# Patient Record
Sex: Male | Born: 1940 | Race: White | Hispanic: No | Smoking: Never smoker
Health system: Southern US, Community
[De-identification: ages and names within clinical notes are randomized; demographics above are authoritative.]

## PROBLEM LIST (undated history)

## (undated) DIAGNOSIS — C800 Disseminated malignant neoplasm, unspecified: Secondary | ICD-10-CM

## (undated) DIAGNOSIS — J189 Pneumonia, unspecified organism: Secondary | ICD-10-CM

## (undated) DIAGNOSIS — I10 Essential (primary) hypertension: Secondary | ICD-10-CM

## (undated) DIAGNOSIS — Z87898 Personal history of other specified conditions: Secondary | ICD-10-CM

## (undated) DIAGNOSIS — C9000 Multiple myeloma not having achieved remission: Secondary | ICD-10-CM

## (undated) DIAGNOSIS — E785 Hyperlipidemia, unspecified: Secondary | ICD-10-CM

## (undated) DIAGNOSIS — C801 Malignant (primary) neoplasm, unspecified: Secondary | ICD-10-CM

## (undated) DIAGNOSIS — I509 Heart failure, unspecified: Secondary | ICD-10-CM

## (undated) DIAGNOSIS — E119 Type 2 diabetes mellitus without complications: Secondary | ICD-10-CM

## (undated) DIAGNOSIS — M21372 Foot drop, left foot: Secondary | ICD-10-CM

## (undated) DIAGNOSIS — I4891 Unspecified atrial fibrillation: Secondary | ICD-10-CM

## (undated) DIAGNOSIS — M1990 Unspecified osteoarthritis, unspecified site: Secondary | ICD-10-CM

## (undated) HISTORY — PX: BACK SURGERY: SHX140

## (undated) HISTORY — PX: CARDIAC CATHETERIZATION: SHX172

---

## 2003-01-31 ENCOUNTER — Encounter: Payer: Self-pay | Admitting: Neurosurgery

## 2003-02-05 ENCOUNTER — Encounter: Payer: Self-pay | Admitting: Neurosurgery

## 2003-02-05 ENCOUNTER — Inpatient Hospital Stay (HOSPITAL_COMMUNITY): Admission: RE | Admit: 2003-02-05 | Discharge: 2003-02-09 | Payer: Self-pay | Admitting: Neurosurgery

## 2003-04-03 ENCOUNTER — Encounter: Payer: Self-pay | Admitting: Neurosurgery

## 2003-04-03 ENCOUNTER — Encounter: Admission: RE | Admit: 2003-04-03 | Discharge: 2003-04-03 | Payer: Self-pay | Admitting: Neurosurgery

## 2003-04-23 ENCOUNTER — Ambulatory Visit (HOSPITAL_COMMUNITY): Admission: RE | Admit: 2003-04-23 | Discharge: 2003-04-23 | Payer: Self-pay | Admitting: Neurosurgery

## 2003-05-06 ENCOUNTER — Encounter: Admission: RE | Admit: 2003-05-06 | Discharge: 2003-05-06 | Payer: Self-pay | Admitting: Neurosurgery

## 2003-10-09 ENCOUNTER — Encounter: Admission: RE | Admit: 2003-10-09 | Discharge: 2003-10-09 | Payer: Self-pay | Admitting: Neurosurgery

## 2003-12-25 ENCOUNTER — Encounter: Admission: RE | Admit: 2003-12-25 | Discharge: 2003-12-25 | Payer: Self-pay | Admitting: Neurosurgery

## 2004-03-16 ENCOUNTER — Encounter: Admission: RE | Admit: 2004-03-16 | Discharge: 2004-03-16 | Payer: Self-pay | Admitting: Neurosurgery

## 2008-03-18 ENCOUNTER — Encounter: Admission: RE | Admit: 2008-03-18 | Discharge: 2008-03-18 | Payer: Self-pay | Admitting: Neurosurgery

## 2009-05-22 ENCOUNTER — Encounter: Admission: RE | Admit: 2009-05-22 | Discharge: 2009-05-22 | Payer: Self-pay | Admitting: Neurosurgery

## 2009-06-04 ENCOUNTER — Encounter: Admission: RE | Admit: 2009-06-04 | Discharge: 2009-06-04 | Payer: Self-pay | Admitting: Neurosurgery

## 2009-08-26 ENCOUNTER — Inpatient Hospital Stay (HOSPITAL_COMMUNITY): Admission: RE | Admit: 2009-08-26 | Discharge: 2009-08-30 | Payer: Self-pay | Admitting: Neurosurgery

## 2010-05-11 ENCOUNTER — Encounter: Admission: RE | Admit: 2010-05-11 | Discharge: 2010-05-11 | Payer: Self-pay | Admitting: Neurosurgery

## 2010-09-12 LAB — GLUCOSE, CAPILLARY
Glucose-Capillary: 120 mg/dL — ABNORMAL HIGH (ref 70–99)
Glucose-Capillary: 134 mg/dL — ABNORMAL HIGH (ref 70–99)
Glucose-Capillary: 147 mg/dL — ABNORMAL HIGH (ref 70–99)
Glucose-Capillary: 156 mg/dL — ABNORMAL HIGH (ref 70–99)
Glucose-Capillary: 159 mg/dL — ABNORMAL HIGH (ref 70–99)
Glucose-Capillary: 164 mg/dL — ABNORMAL HIGH (ref 70–99)
Glucose-Capillary: 185 mg/dL — ABNORMAL HIGH (ref 70–99)
Glucose-Capillary: 189 mg/dL — ABNORMAL HIGH (ref 70–99)
Glucose-Capillary: 190 mg/dL — ABNORMAL HIGH (ref 70–99)
Glucose-Capillary: 70 mg/dL (ref 70–99)

## 2010-09-12 LAB — CBC
HCT: 31.6 % — ABNORMAL LOW (ref 39.0–52.0)
Hemoglobin: 10.8 g/dL — ABNORMAL LOW (ref 13.0–17.0)
Hemoglobin: 9.5 g/dL — ABNORMAL LOW (ref 13.0–17.0)
MCHC: 33.8 g/dL (ref 30.0–36.0)
MCHC: 33.8 g/dL (ref 30.0–36.0)
MCHC: 34.6 g/dL (ref 30.0–36.0)
MCV: 96.3 fL (ref 78.0–100.0)
MCV: 96.5 fL (ref 78.0–100.0)
Platelets: 187 10*3/uL (ref 150–400)
Platelets: 271 10*3/uL (ref 150–400)
RBC: 2.84 MIL/uL — ABNORMAL LOW (ref 4.22–5.81)
RBC: 3.07 MIL/uL — ABNORMAL LOW (ref 4.22–5.81)
RBC: 4.49 MIL/uL (ref 4.22–5.81)
RDW: 14.5 % (ref 11.5–15.5)
RDW: 14.5 % (ref 11.5–15.5)
RDW: 14.9 % (ref 11.5–15.5)
WBC: 11.1 10*3/uL — ABNORMAL HIGH (ref 4.0–10.5)

## 2010-09-12 LAB — CARDIAC PANEL(CRET KIN+CKTOT+MB+TROPI)
CK, MB: 5.3 ng/mL — ABNORMAL HIGH (ref 0.3–4.0)
Relative Index: 1.9 (ref 0.0–2.5)
Total CK: 245 U/L — ABNORMAL HIGH (ref 7–232)
Total CK: 281 U/L — ABNORMAL HIGH (ref 7–232)
Total CK: 312 U/L — ABNORMAL HIGH (ref 7–232)
Troponin I: 0.01 ng/mL (ref 0.00–0.06)

## 2010-09-12 LAB — PROTIME-INR
INR: 0.97 (ref 0.00–1.49)
Prothrombin Time: 12.8 seconds (ref 11.6–15.2)

## 2010-09-12 LAB — POCT I-STAT 7, (LYTES, BLD GAS, ICA,H+H)
Acid-Base Excess: 3 mmol/L — ABNORMAL HIGH (ref 0.0–2.0)
Calcium, Ion: 1.12 mmol/L (ref 1.12–1.32)
Calcium, Ion: 1.12 mmol/L (ref 1.12–1.32)
HCT: 34 % — ABNORMAL LOW (ref 39.0–52.0)
Hemoglobin: 11.6 g/dL — ABNORMAL LOW (ref 13.0–17.0)
O2 Saturation: 100 %
O2 Saturation: 100 %
Potassium: 3.5 mEq/L (ref 3.5–5.1)
Potassium: 3.9 mEq/L (ref 3.5–5.1)
Sodium: 137 mEq/L (ref 135–145)
TCO2: 28 mmol/L (ref 0–100)
pCO2 arterial: 40.7 mmHg (ref 35.0–45.0)
pH, Arterial: 7.427 (ref 7.350–7.450)
pO2, Arterial: 376 mmHg — ABNORMAL HIGH (ref 80.0–100.0)

## 2010-09-12 LAB — BASIC METABOLIC PANEL
BUN: 14 mg/dL (ref 6–23)
BUN: 16 mg/dL (ref 6–23)
CO2: 27 mEq/L (ref 19–32)
CO2: 28 mEq/L (ref 19–32)
Calcium: 9.1 mg/dL (ref 8.4–10.5)
Chloride: 106 mEq/L (ref 96–112)
Creatinine, Ser: 0.6 mg/dL (ref 0.4–1.5)
Creatinine, Ser: 0.69 mg/dL (ref 0.4–1.5)
GFR calc non Af Amer: >60 mL/min (ref >60)
GFR calc non Af Amer: >60 mL/min (ref >60)
Glucose, Bld: 161 mg/dL — ABNORMAL HIGH (ref 70–99)
Glucose, Bld: 206 mg/dL — ABNORMAL HIGH (ref 70–99)
Potassium: 3.9 mEq/L (ref 3.5–5.1)
Potassium: 3.9 mEq/L (ref 3.5–5.1)
Sodium: 136 mEq/L (ref 135–145)
Sodium: 137 mEq/L (ref 135–145)

## 2010-09-12 LAB — ABO/RH: ABO/RH(D): A POS

## 2010-09-12 LAB — SURGICAL PCR SCREEN

## 2010-09-12 LAB — POCT I-STAT GLUCOSE: Operator id: 305871

## 2010-11-05 NOTE — Op Note (Signed)
NAME:  Phillip Meyer, Phillip Meyer NO.:  [id number]   MEDICAL RECORD NO.:  [id number]                   PATIENT TYPE:  OIB   LOCATION:  2899                                 FACILITY:  [facility]   PHYSICIAN:  Donalee Citrin, M.D.                     DATE OF BIRTH:  February 06, 1941   DATE OF PROCEDURE:  04/23/2003  DATE OF DISCHARGE:  04/23/2003                                 OPERATIVE REPORT   PREOPERATIVE DIAGNOSIS:  Right carpal tunnel syndrome.   POSTOPERATIVE DIAGNOSIS:  Right carpal tunnel syndrome.   PROCEDURE:  Right carpal tunnel release.   SURGEON:  Donalee Citrin, M.D.   ANESTHESIA:  Bier block.   HISTORY OF PRESENT ILLNESS:  The patient is a very pleasant 70 year old  gentleman who underwent multilevel lumbar spinal fusion several weeks ago.  Postoperatively, he awoke with some numbness in his right hand and this got  progressively worse.  EMG and nerve conduction tests revealed severe carpal  tunnel syndrome and it was felt that the patient had some underlying median  neuropathy due to the positioning in the operating room and due to the  fluids required and the management of his spinal fusion this must have  tipped him over the scales and he became symptomatic in the right hand.  Due  to the severity of the median neuropathy by EMG and the patient's refractory  symptoms, the patient was recommended a carpal tunnel release.  I discussed  the risks and benefits of surgery with him and he understood and agreed to  proceed forth.   DESCRIPTION OF PROCEDURE:  The patient was brought to the operating room and  was positioned supine.  The right arm was induced under Bier block with a  tourniquet and after adequate analgesia had been obtained in the right upper  extremity, this was prepped and draped in the usual sterile fashion.  A  midline incision was drawn out just from the inferior crease of the wrist to  the distal crease of the wrist through the palmar crease in  the mid area of  his palm extending approximately 5 cm.  Then, this was incised with a 15  blade scalpel.  A self-retaining retractor was placed.  The subcutaneous  tissue was dissected down to the retinaculum which was sharply divided very  carefully until we got inferior and could visualize the nerve sheath around  the median nerve underneath it.  Then, using hemostats, this was freed up  from the median nerve sheath and incised with a combination of 15 blade  scalpel and Mayo scissors.  This was noted to be severely hypertrophied and  causing a lot of median nerve compression.  After the retinaculum had been  divided and adequate decompression of the median nerve both proximally and  distally had been obtained as explored with a pair of hemostats, the wound  was copiously  irrigated and meticulous hemostasis was maintained.  The skin  was closed with an interrupted vertical mattress.  The tourniquet was  released.  Total tourniquet time was approximately 30 minutes.  The patient  appeared to be neurovascularly intact and upon leaving the operating room  went to the recovery room in stable condition.  At the end of the case, all  needle counts and sponge counts were correct.                                               Donalee Citrin, M.D.    GC/MEDQ  D:  04/23/2003  T:  04/23/2003  Job:  [id number]

## 2010-11-05 NOTE — Discharge Summary (Signed)
   NAME:  Phillip Meyer, Phillip Meyer NO.:  [id number]   MEDICAL RECORD NO.:  [id number]                   PATIENT TYPE:  INP   LOCATION:  3023                                 FACILITY:  [facility]   PHYSICIAN:  Donalee Citrin, M.D.                     DATE OF BIRTH:  03/18/1941   DATE OF ADMISSION:  02/05/2003  DATE OF DISCHARGE:  02/09/2003                                 DISCHARGE SUMMARY   ADMITTING DIAGNOSIS:  Severe spinal stenosis.   PROCEDURE DURING THIS HOSPITALIZATION:  Redo decompressive laminectomies and  posterior lumbar interbody fusions, L2-3, 3-4 and 4-5.   SURGEON:  Donalee Citrin, M.D.   ASSISTANT:  Kathaleen Maser. Pool, M.D.   HOSPITAL COURSE:  The patient was admitted as an a.m. admit and underwent  the aforementioned procedure.  Postoperatively, the patient did very well,  coming to the floor.  On the floor, the patient was afebrile with stable  vital signs, doing very well.  His postop laboratory was within normal  limits.  The patient was progressively mobilized with physical and  occupational therapy and over the next couple of days, his drain output  continued to decrease, his hematocrit was stable at 28 and his drain was  able to be taken out on hospital day 3, however, it was noted to be tented  and the drain snapped with a retained fragment of the drain within the  wound, therefore, the patient was extensively counseled and recommended  bedside opening up the superior aspect of the incision, which was performed  under sterile conditions, and the remainder of the drain that was retained  was removed and then this was resutured; this was done sterilely and the  patient was dressed and was progressively mobilized over the next couple of  days and was able to be discharged home the following day with scheduled  followup in two weeks.  At the time of dictation, the patient is awake,  alert, ambulating and voiding spontaneously, tolerating a regular diet and  was discharged to follow up in two weeks.                                                Donalee Citrin, M.D.    GC/MEDQ  D:  03/12/2003  T:  03/13/2003  Job:  [id number]

## 2010-11-05 NOTE — Op Note (Signed)
NAME:  Phillip Meyer, Phillip Meyer NO.:  [id number]   MEDICAL RECORD NO.:  [id number]                   PATIENT TYPE:  INP   LOCATION:  3172                                 FACILITY:  [facility]   PHYSICIAN:  Donalee Citrin, M.D.                     DATE OF BIRTH:  [birth year]   DATE OF PROCEDURE:  02/05/2003  DATE OF DISCHARGE:                                 OPERATIVE REPORT   PREOPERATIVE DIAGNOSIS:  Severe spinal stenosis, L2-3, L3-4, L4-5, with  severe mechanical instability at these levels.   PROCEDURES:  1. Redo decompressive laminectomy, L2-3, redo decompressive laminectomy, L3-     4, and redo L4-5.  2. Posterior lumbar interbody fusion, L2-3, L3-4, and L4-5, using a single     10 x 26 mm Tangent allograft wedge at L2-3 secondary to conjoined root on     the left side at L2-3, two 8 x 26 mm Tangent allograft wedges at L3-4,     and two 10 x 26 mm Tangent allograft wedges at L4-5.  3. Posterolateral arthrodesis, L2 to L5, using locally-harvested autograft.  4. Pedicle segmental fixation, segmental L2 to L5, using the M10 CD Horizon     pedicle screw system with 6.5 x 45 mm pedicle screws inserted in all     eight screws, two crosslinks.  5. Medium Hemovac drain placement.   SURGEON:  Donalee Citrin, M.D.   ASSISTANT:  Kathaleen Maser. Pool, M.D.   ANESTHESIA:  General endotracheal.   HISTORY OF PRESENT ILLNESS:  The patient is a very pleasant 70 year old  gentleman who has had multiple previous laminectomies and who presents with  severe neurogenic claudication with back greater than leg pain at  approximately a half-block.  The patient's preoperative imaging showed  severe spinal stenosis with virtually complete spinal fluid block on MR at  L2-3, L3-4, and L4-5 due to a combination of ruptured disk predominantly at  L3-4 and L4-5 as well as severe facet arthropathy and scar tissue at L2-3,  L3-4, and L4-5.  Due to the patient's failure of conservative treatment with  epidural steroid injections, physical therapy, anti-inflammatories, pain  medicine, and time, and the patient's progressive deterioration in his legs  with an old foot drop on the left and progressive worsening weakness and  pain in his right leg, the patient was recommended a decompressive  stabilization procedure due to the fact that with the multiple previous  operations and the degree of radical laminectomy and decompression that  would need to be accomplished to alleviate his stenosis, subsequent fusion  would be necessitated by the instability created.  In addition, the  patient's symptomatology also supported severe discogenic mechanical  instability as well as neurogenic claudication.   The patient was brought into the OR, was induced under general anesthesia,  positioned prone on the Wilson frame.  The back was prepped in the usual  sterile fashion.  His old incision was opened up and extended.  The scar  tissue was then divided and Bovie electrocautery was used to expose the  spinous processes of L2, L3, L4, and L5, and then subperiosteal dissection  with the Bovie electrocautery was carried out in the lamina of L2, the  residual laminae of L3, L4, and L5, exposing the TPs of L2, L3, L4, and L5  bilaterally.  There was a tremendous amount of scar tissue on both sides,  predominantly on the left at L2-3 and L3-4 and on the right at L3-4 and L4-  5.  This scar tissue was teased away, exposing the lateral facet complexes  and the TPs.  A self-retaining retractor was placed.  Then after  intraoperative x-ray confirmed the localization of the L3 and L5 pedicles,  attention was first taken to the decompression. Using a Leksell rongeur, the  spinous process of L4 was removed as well as the residual spinous processes  of L3 and L2.  Then attention was made to get underneath the laminae of L2,  L3, L4, and L5; however, the only place we were able to gain access was at  the L2-3 disk  space, and this was achieved with a 2 and 3 mm Kerrison punch.  Attention was first taken to the patient's right side.  A complete  laminectomy was performed at L2 and exposing the L2 nerve root, and then the  medial aspect of the facet complex was removed and the scar tissue was freed  up from the undersurface of this, decompressing the L2 and L3 nerves on the  right side.  The scar tissue was then teased away with a combination of  sharp and blunt dissection underneath the residual lamina and facet complex  of L3-4. The L3 and L4 nerve roots were identified out their neural foramina  and the medial facet complex was removed after the scar tissue was freed up  at L3-4.  This was carried also down to L4-5 with complete medial  facetectomies at L4-5.  There was noted to be a tremendous amount of scar  tissue also over the L5 nerve root on the right, and this was all freed up  and the pedicle was identified and this nerve was released from the pedicle.  Then attention was taken to the opposite.  First at L4-5 the residual lamina  of L4 and the medial aspect of the facet complex was removed at L4-5,  exposing the L4 and L5 nerve roots on that side.  Then there was a lot of  scar tissue at L3-4 disk space on the left, and this was sequentially teased  away with dental dissectors as well as 4 Penfield, exposing the proximal  aspect of the L4 nerve.  The L4 nerve was difficult to appreciate.  It was  noted to be severely compressed under the undersurface of the pedicle and  very flattened out.  The neural foramina at L3 and L4 on the right side also  had been very stenotic, and these were all relieved and underbitten when the  neural foramen was widely opened up.  Then attention was taken first back up  to the L2-3 disk space due to the scar tissue at L3-4, and the L2 nerve root was identified under the pedicle above.  Interestingly, there was also noted  to be a takeoff of the L3 nerve root just  subjacent to the L2 nerve root,  and it actually appeared  to have dual takeoff in a conjoined root.  This  nerve complex was then decompressed out its foramen and there was noted to  be virtually no facet complex residual at L2-3 and the neural foramina were  widely decompressed out along the pedicles of both the L2 and the L3  pedicle.  Then the scar tissue was teased away from the residual lamina of  L3 and the facet complex at L3-4 and with meticulous dissection techniques,  the remainder of the facet complex was removed, exposing the interspace at  L3-4.  There was a tremendous amount of stenosis bulged on the L3 nerve root  as well as the L4 nerve root at this level and felt to explain some of his  preoperative foot drop that he had had for several years on the left side.  All neural foramina were widely patent and decompressed out the neural  foramina at the end of the decompression.  Attention was then taken to  pedicle screw placement.  Pilot holes were drilled at the L2 pedicle on the  right, cannulated with the awl, and tapped with a 5.5 tap and a 6.5 x 45  pedicle screw inserted at L2 on the right.  Fluoroscopy confirmed depth and  trajectory at each step along the way.  Each pedicle was probed from within  the pedicle as well as direct intracanalicular inspection confirming no  medial breach.  The remainder of the seven pedicle screws were all placed in  a similar fashion.  After all eight pedicle screws were in place, attention  was taken to the interbody work.  The D'Errico nerve root retractor was used  to reflect the right L5 nerve root medially.  The disk space was radically  cleaned out and noted to be severely degenerated, and a large ruptured disk  coming up the central interspace and compressing the right L5 nerve root was  also removed, and this was cleaned out with downgoing Epstein curette and  pituitary rongeurs.  Then using a size 10 distractor, this was inserted  and  noted to have good apposition with the end plates.  Attention was taken then  to the left side.  The left L5 nerve root was reflected medially.  The disk  space was radically cleaned out and using a size 10 cutter and chisel, the  end plate was prepared to receive the bone graft.  Then a 10 x 26 mm Tangent  allograft was inserted on the left side.  Then attention was taken back to  the right.  This disk space was radically cleaned out and cut with a size 10  cutter and chisel, and locally-harvested autograft was packed against the  allograft on the left side and the right-sided allograft was inserted.  Then  the D'Errico nerve root retractor was used to retract the right L4 nerve  root medially and the L3-4 disk space was radically cleaned out.  This was  noted to be severely collapsed and degenerated with partially-calcified disk fragments centrally on the right side.  This was all bitten down with an  osteophyte remover as well as a downgoing Epstein curette, and a size 8  distractor was inserted with good apposition of the end plates.  The left  side disk space was cleaned out, cut with a size 8 cutter and chisel, and an  8 x 26 mm Tangent allograft was inserted on the left side.  Then on the  right side again the cutter and  chisel were used to prepare the end plates.  The central end plates were scraped down with the Epstein curette.  Locally-  harvested autograft was packed against the allograft on the left side, and  the right-sided allograft was inserted.  Then due to the conjoined nerve  root at L2-3, there was only able to be one allograft inserted on the left  side, so using a combination of the ring curette, the downgoing round  scraper, as well as the usual cutter and chisel, the interspace was  radically cleaned out from one side.  Then with the chisel to be directed  slightly more medially, a 10 x 26 mm Tangent allograft was inserted from the  right side to the central  interspace and then it was displaced and moved in  a more lateral trajectory with the lateral tool.  Then this graft was  inserted after some locally-harvested graft was packed in the anterior  interspace and lateral interspace behind the bone graft.  Then after all  bone grafts were in place and fluoroscopy confirmed depth and trajectory at  each position along the way and confirmed good position of bone grafts, the  wound was copiously irrigated and meticulous hemostasis was maintained.  Aggressive decortication was carried out in the lateral gutters and the TPs  at L2, L3, L4, and L5.  Then the remainder of the locally-harvested  autograft was packed in the lateral gutters.  Rods were sized, cut,  fashioned, and bent, and then the nuts were placed at L5 and tightened down  and torqued down.  The L4 pedicle screw was compressed against L5, the L3  against L4, and the L2 against L3.  Very little compression was performed on  the left side at L2-3 due to the lack of bone graft; however, it was  adequately compressed on the right side at L2-3.  Two crosslinks were  placed.  Postop fluoroscopy confirmed good position of the screws, rods, and  bone grafts.  Gelfoam was overlaid on top of the dura.  The muscle and  fascia were reapproximated with 0 interrupted Vicryl, the subcuticular  tissues with 2-0 interrupted Vicryl, and the skin was closed with running 4-  0 subcuticular.  Benzoin and Steri-Strips were applied.  The patient went to  the recovery room in stable condition.  At the end of the case all needle  counts and sponge counts were correct.                                                Donalee Citrin, M.D.    GC/MEDQ  D:  02/05/2003  T:  02/06/2003  Job:  [id number]

## 2011-05-17 DIAGNOSIS — I5022 Chronic systolic (congestive) heart failure: Secondary | ICD-10-CM | POA: Diagnosis present

## 2011-05-17 DIAGNOSIS — E785 Hyperlipidemia, unspecified: Secondary | ICD-10-CM | POA: Diagnosis present

## 2011-05-17 DIAGNOSIS — I10 Essential (primary) hypertension: Secondary | ICD-10-CM | POA: Diagnosis present

## 2012-01-19 DIAGNOSIS — I4891 Unspecified atrial fibrillation: Secondary | ICD-10-CM | POA: Insufficient documentation

## 2012-11-16 ENCOUNTER — Other Ambulatory Visit: Payer: Self-pay | Admitting: Neurosurgery

## 2012-12-17 ENCOUNTER — Encounter (HOSPITAL_COMMUNITY): Payer: Self-pay | Admitting: Pharmacy Technician

## 2012-12-17 ENCOUNTER — Encounter (HOSPITAL_COMMUNITY)
Admission: RE | Admit: 2012-12-17 | Discharge: 2012-12-17 | Disposition: A | Discharge status: Home / Self Care | Payer: Medicare Other | Source: Ambulatory Visit | Attending: Neurosurgery | Admitting: Neurosurgery

## 2012-12-17 ENCOUNTER — Encounter (HOSPITAL_COMMUNITY): Payer: Self-pay

## 2012-12-17 ENCOUNTER — Encounter (HOSPITAL_COMMUNITY)
Admission: RE | Admit: 2012-12-17 | Discharge: 2012-12-17 | Disposition: A | Discharge status: Home / Self Care | Payer: Medicare Other | Source: Ambulatory Visit | Attending: Anesthesiology | Admitting: Anesthesiology

## 2012-12-17 HISTORY — DX: Unspecified osteoarthritis, unspecified site: M19.90

## 2012-12-17 HISTORY — DX: Essential (primary) hypertension: I10

## 2012-12-17 HISTORY — DX: Type 2 diabetes mellitus without complications: E11.9

## 2012-12-17 HISTORY — DX: Heart failure, unspecified: I50.9

## 2012-12-17 LAB — TYPE AND SCREEN
ABO/RH(D): A POS
Antibody Screen: NEGATIVE

## 2012-12-17 LAB — BASIC METABOLIC PANEL
BUN: 16 mg/dL (ref 6–23)
Calcium: 8.9 mg/dL (ref 8.4–10.5)
Creatinine, Ser: 0.68 mg/dL (ref 0.50–1.35)
GFR calc Af Amer: >90 mL/min (ref >90)
GFR calc non Af Amer: >90 mL/min (ref >90)

## 2012-12-17 LAB — CBC
HCT: 44 % (ref 39.0–52.0)
MCHC: 34.5 g/dL (ref 30.0–36.0)
Platelets: 261 10*3/uL (ref 150–400)
RDW: 13.2 % (ref 11.5–15.5)
WBC: 8.8 10*3/uL (ref 4.0–10.5)

## 2012-12-17 LAB — SURGICAL PCR SCREEN
MRSA, PCR: NEGATIVE
Staphylococcus aureus: POSITIVE — AB

## 2012-12-17 NOTE — Progress Notes (Addendum)
req'd office notes, tests from recent visit to cardiac dr at [facility] dr Towana Badger [phone] fax

## 2012-12-17 NOTE — Pre-Procedure Instructions (Addendum)
Phillip Meyer  12/17/2012   Your procedure is scheduled on:  12/28/12  Report to [facility] at 530 AM.  Call this number if you have problems the morning of surgery: [phone]   Remember:   Do not eat food or drink liquids after midnight.   Take these medicines the morning of surgery with A SIP OF WATER: ,(sotatol)betapace                 STOP per dr  Warfarin, aspirin , omega 3  12/21/12   Do not wear jewelry, make-up or nail polish.  Do not wear lotions, powders, or perfumes. You may wear deodorant.  Do not shave 48 hours prior to surgery. Men may shave face and neck.  Do not bring valuables to the hospital.  [facility] is not responsible                   for any belongings or valuables.  Contacts, dentures or bridgework may not be worn into surgery.  Leave suitcase in the car. After surgery it may be brought to your room.  For patients admitted to the hospital, checkout time is 11:00 AM the day of  discharge.   Patients discharged the day of surgery will not be allowed to drive  home.  Name and phone number of your driver:   Special Instructions: Shower using CHG 2 nights before surgery and the night before surgery.  If you shower the day of surgery use CHG.  Use special wash - you have one bottle of CHG for all showers.  You should use approximately 1/3 of the bottle for each shower.   Please read over the following fact sheets that you were given: Pain Booklet, Coughing and Deep Breathing, Blood Transfusion Information and MRSA Information

## 2012-12-17 NOTE — Progress Notes (Signed)
12/17/12 0952  OBSTRUCTIVE SLEEP APNEA  Have you ever been diagnosed with sleep apnea through a sleep study? Yes (neg  8-10 yrs ago)  Do you snore loudly (loud enough to be heard through closed doors)?  0  Do you often feel tired, fatigued, or sleepy during the daytime? 0  Has anyone observed you stop breathing during your sleep? 0  Do you have, or are you being treated for high blood pressure? 1  BMI more than 35 kg/m2? 1  Age over 72 years old? 1  Neck circumference greater than 40 cm/18 inches? 0  Gender: 1  Obstructive Sleep Apnea Score 4  Score 4 or greater  Results sent to PCP

## 2012-12-18 NOTE — Progress Notes (Addendum)
Anesthesia Chart Review:  Patient is a 72 year old male scheduled for L5-S1 PLIF on 7/11/4 by Dr. Wynetta Emery.  History includes morbid obesity (BMI 40), DM2, HTN, afib, CHF, non-smoker, prior back surgery.  EKG on 12/17/12 showed afib @ 66 bpm, LAD, inferior infarct (age undetermined).  Cardiologist is Dr. Shelbie Ammons at [facility] are pending.  CXR report on 12/17/12 showed: The heart size and mediastinal contours are stable. Coronary artery calcifications are noted. The lungs are clear. There is no pleural effusion or pneumothorax. Postsurgical changes are noted in the upper lumbar spine.  Preoperative labs noted.  He will need PT/PTT on the day of surgery.  PAT RN reports patient stated that he was told to hold Coumadin 5-7 days preoperatively.  I'll follow-up once additional cardiology records are received.  [facility]/Anesthesiology Phone [phone] 12/18/2012 4:28 PM  Update: I requested records via fax from [facility] on 12/19/12 and called for records again on 12/20/12 and was told to fax to [phone] which is where the request was already sent.  I did speak with patient who reported that he saw Dr. Roxan Hockey just within the last few months and notified him of plans for surgery.  He subsequently had an echo done that reported showed "improvement in his pumping function."  He reports a cardiac cath that did not require any intervention in the past, but it may have been > 5 years ago.  Will follow-up with [facility] again next week if records still not received.  Patient will be holding his Coumadin on 12/21/12.  Addendum: 12/25/12 5:00 PM Patient's surgery was moved up to 12/26/12.  After four requests, cardiology records finally received from [facility] today.  Patient was just seen by Dr. Roxan Hockey on 11/22/12.  Records indicate that he underwent d/c cardioversion for afib in 05/2009 and also has what sounds to be a history of non-ischemic cardiomyopathy.   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  He has had a few follow-up echocardiograms since, most recently on 11/22/12. Results showed: normal LV size and thickness, mildly reduced LV systolic function, EF 45-50%, mild global hypokinesis of the LV.  LA moderately dilated.  Mild MR/TR.  Mild pulmonary HTN.  No pericardial effusion.   His last EKG at Dr. Danella Penton office was in 2011; however, his heart rhythm was documented as "regular" during his 11/22/12 exam.  His 12/17/12 EKG showed recurrent afib, but rate controlled.  Dr. Noreene Larsson called and spoke with Dr. Roxan Hockey about patient's history, recurrent EKG, and plans for surgery.  Dr. Roxan Hockey felt that unless there was a change in patient's symptomology he would not recommend additional cardiac testing pre-operatively.

## 2012-12-25 MED ORDER — CEFAZOLIN SODIUM-DEXTROSE 2-3 GM-% IV SOLR
2.0000 g | INTRAVENOUS | Status: AC
  Administered 2012-12-26: 2 g via INTRAVENOUS

## 2012-12-25 NOTE — Progress Notes (Signed)
Patient called and asked for instructions about surgery for tomorrow, "the office told me that you would call me yesterday or tomorrow, and aint nobody called me."  I checked schedule and patient is scheduled for 12/26/12 at 1505.  I instruced patient to arrive at 1pm.  "Well what am I suppose to do with my Insulin?"  I instructed him to not take any Insulin tomorrow, I reminded patient that he is not to eat or drink after midnight. " I hope I dont pass out, if I do it will y'alls fought.  I told patient that our department is not allowed to instructed people anything but nothing to eat or drink after midnight and to not take Insulin on day of surgery.  I instructed him to call his Drs office in the am to see if they have any different instructions.

## 2012-12-25 NOTE — Consult Note (Signed)
Anesthesiology Chart Review:  After reviewing the notes from the [facility], it was noted that  Mr. Phillip Meyer had been followed for atrial fibrillation and dilated cardiomyopathy. He was last seen on 11/22/2012 and had a 2-D echo which showed an ejection fraction of 45-50% with mild global hypokinesis and mild MR and TR.   I then spoke with his cardiologist Dr. Shelbie Ammons. Dr. Roxan Hockey said Mr. Phillip Meyer may be in atrial fibrillation continuously or he may alternate between atrial fibrillation and sinus rhythm. He is being treated with warfarin. He was in his usual state of health when he was last seen on 11/22/12.  Dr. Roxan Hockey felt that further cardiac testing was not necessary at this time and that he should be able to undergo  the L5-S1 fusion  by Dr. Wynetta Emery with acceptable risk.  Phillip Brood, MD

## 2012-12-26 ENCOUNTER — Encounter (HOSPITAL_COMMUNITY)
Admission: RE | Disposition: A | Discharge status: Home Health | Payer: Self-pay | Source: Ambulatory Visit | Attending: Neurosurgery

## 2012-12-26 ENCOUNTER — Inpatient Hospital Stay (HOSPITAL_COMMUNITY): Payer: Medicare Other | Admitting: Anesthesiology

## 2012-12-26 ENCOUNTER — Encounter (HOSPITAL_COMMUNITY): Payer: Self-pay | Admitting: Vascular Surgery

## 2012-12-26 ENCOUNTER — Inpatient Hospital Stay (HOSPITAL_COMMUNITY)
Admission: RE | Admit: 2012-12-26 | Discharge: 2012-12-29 | DRG: 460 | Disposition: A | Discharge status: Home Health | Payer: Medicare Other | Source: Ambulatory Visit | Attending: Neurosurgery | Admitting: Neurosurgery

## 2012-12-26 ENCOUNTER — Encounter (HOSPITAL_COMMUNITY): Payer: Self-pay | Admitting: *Deleted

## 2012-12-26 ENCOUNTER — Inpatient Hospital Stay (HOSPITAL_COMMUNITY): Payer: Medicare Other

## 2012-12-26 DIAGNOSIS — I4891 Unspecified atrial fibrillation: Secondary | ICD-10-CM | POA: Diagnosis present

## 2012-12-26 DIAGNOSIS — Z7901 Long term (current) use of anticoagulants: Secondary | ICD-10-CM

## 2012-12-26 DIAGNOSIS — M48062 Spinal stenosis, lumbar region with neurogenic claudication: Principal | ICD-10-CM | POA: Diagnosis present

## 2012-12-26 DIAGNOSIS — M129 Arthropathy, unspecified: Secondary | ICD-10-CM | POA: Diagnosis present

## 2012-12-26 DIAGNOSIS — Z981 Arthrodesis status: Secondary | ICD-10-CM

## 2012-12-26 DIAGNOSIS — E119 Type 2 diabetes mellitus without complications: Secondary | ICD-10-CM | POA: Diagnosis present

## 2012-12-26 DIAGNOSIS — I509 Heart failure, unspecified: Secondary | ICD-10-CM | POA: Diagnosis present

## 2012-12-26 DIAGNOSIS — I10 Essential (primary) hypertension: Secondary | ICD-10-CM | POA: Diagnosis present

## 2012-12-26 DIAGNOSIS — I428 Other cardiomyopathies: Secondary | ICD-10-CM | POA: Diagnosis present

## 2012-12-26 LAB — PROTIME-INR
INR: 1.07 (ref 0.00–1.49)
Prothrombin Time: 13.7 seconds (ref 11.6–15.2)

## 2012-12-26 LAB — APTT: aPTT: 26 seconds (ref 24–37)

## 2012-12-26 LAB — GLUCOSE, CAPILLARY: Glucose-Capillary: 149 mg/dL — ABNORMAL HIGH (ref 70–99)

## 2012-12-26 SURGERY — POSTERIOR LUMBAR FUSION 1 LEVEL
Anesthesia: General | Site: Back | Wound class: Clean

## 2012-12-26 MED ORDER — THROMBIN 20000 UNITS EX SOLR
CUTANEOUS | Status: DC | PRN
  Administered 2012-12-26: 15:00:00 via TOPICAL

## 2012-12-26 MED ORDER — LABETALOL HCL 5 MG/ML IV SOLN
INTRAVENOUS | Status: DC | PRN
  Administered 2012-12-26: 10 mg via INTRAVENOUS

## 2012-12-26 MED ORDER — PROMETHAZINE HCL 25 MG/ML IJ SOLN: 6.2500 mg | INTRAMUSCULAR | Status: DC | PRN

## 2012-12-26 MED ORDER — WARFARIN SODIUM 5 MG PO TABS
5.0000 mg | ORAL_TABLET | ORAL | Status: DC
  Administered 2012-12-27: 5 mg via ORAL
  Filled 2012-12-26 (×2): qty 1

## 2012-12-26 MED ORDER — ASPIRIN EC 81 MG PO TBEC
81.0000 mg | DELAYED_RELEASE_TABLET | Freq: Every day | ORAL | Status: DC
  Administered 2012-12-26 – 2012-12-29 (×4): 81 mg via ORAL
  Filled 2012-12-26 (×4): qty 1

## 2012-12-26 MED ORDER — 0.9 % SODIUM CHLORIDE (POUR BTL) OPTIME
TOPICAL | Status: DC | PRN
  Administered 2012-12-26: 1000 mL

## 2012-12-26 MED ORDER — HYDROMORPHONE HCL PF 1 MG/ML IJ SOLN
0.5000 mg | INTRAMUSCULAR | Status: DC | PRN
  Administered 2012-12-26 – 2012-12-27 (×2): 1 mg via INTRAVENOUS
  Filled 2012-12-26: qty 1

## 2012-12-26 MED ORDER — OXYCODONE HCL 5 MG PO TABS: 5.0000 mg | ORAL_TABLET | Freq: Once | ORAL | Status: DC | PRN

## 2012-12-26 MED ORDER — CEFAZOLIN SODIUM-DEXTROSE 2-3 GM-% IV SOLR
INTRAVENOUS | Status: AC
  Filled 2012-12-26: qty 50

## 2012-12-26 MED ORDER — OMEGA-3-ACID ETHYL ESTERS 1 G PO CAPS
1.0000 g | ORAL_CAPSULE | Freq: Two times a day (BID) | ORAL | Status: DC
  Administered 2012-12-26 – 2012-12-29 (×6): 1 g via ORAL
  Filled 2012-12-26 (×8): qty 1

## 2012-12-26 MED ORDER — ROCURONIUM BROMIDE 100 MG/10ML IV SOLN
INTRAVENOUS | Status: DC | PRN
  Administered 2012-12-26: 10 mg via INTRAVENOUS
  Administered 2012-12-26: 50 mg via INTRAVENOUS
  Administered 2012-12-26: 20 mg via INTRAVENOUS

## 2012-12-26 MED ORDER — BUPIVACAINE HCL (PF) 0.25 % IJ SOLN
INTRAMUSCULAR | Status: DC | PRN
  Administered 2012-12-26: 20 mL

## 2012-12-26 MED ORDER — SOTALOL HCL 80 MG PO TABS
80.0000 mg | ORAL_TABLET | Freq: Two times a day (BID) | ORAL | Status: DC
  Administered 2012-12-26 – 2012-12-29 (×4): 80 mg via ORAL
  Filled 2012-12-26 (×8): qty 1

## 2012-12-26 MED ORDER — EPHEDRINE SULFATE 50 MG/ML IJ SOLN
INTRAMUSCULAR | Status: DC | PRN
  Administered 2012-12-26: 10 mg via INTRAVENOUS

## 2012-12-26 MED ORDER — POTASSIUM CHLORIDE IN NACL 20-0.45 MEQ/L-% IV SOLN
INTRAVENOUS | Status: DC
  Administered 2012-12-26: 22:00:00 via INTRAVENOUS
  Filled 2012-12-26 (×6): qty 1000

## 2012-12-26 MED ORDER — ACETAMINOPHEN 325 MG PO TABS: 650.0000 mg | ORAL_TABLET | ORAL | Status: DC | PRN

## 2012-12-26 MED ORDER — HYDROMORPHONE HCL PF 1 MG/ML IJ SOLN
INTRAMUSCULAR | Status: AC
  Filled 2012-12-26: qty 1

## 2012-12-26 MED ORDER — BACITRACIN 50000 UNITS IM SOLR
INTRAMUSCULAR | Status: AC
  Filled 2012-12-26: qty 1

## 2012-12-26 MED ORDER — INSULIN ASPART 100 UNIT/ML ~~LOC~~ SOLN
0.0000 [IU] | Freq: Every day | SUBCUTANEOUS | Status: DC
  Administered 2012-12-27: 3 [IU] via SUBCUTANEOUS

## 2012-12-26 MED ORDER — SIMVASTATIN 10 MG PO TABS
10.0000 mg | ORAL_TABLET | Freq: Every day | ORAL | Status: DC
  Administered 2012-12-26 – 2012-12-28 (×2): 10 mg via ORAL
  Filled 2012-12-26 (×4): qty 1

## 2012-12-26 MED ORDER — GLYCOPYRROLATE 0.2 MG/ML IJ SOLN
INTRAMUSCULAR | Status: DC | PRN
  Administered 2012-12-26: 0.6 mg via INTRAVENOUS

## 2012-12-26 MED ORDER — CEFAZOLIN SODIUM 1-5 GM-% IV SOLN
1.0000 g | Freq: Three times a day (TID) | INTRAVENOUS | Status: AC
  Administered 2012-12-26 – 2012-12-28 (×6): 1 g via INTRAVENOUS
  Filled 2012-12-26 (×6): qty 50

## 2012-12-26 MED ORDER — SODIUM CHLORIDE 0.9 % IJ SOLN: 3.0000 mL | INTRAMUSCULAR | Status: DC | PRN

## 2012-12-26 MED ORDER — FENTANYL CITRATE 0.05 MG/ML IJ SOLN
INTRAMUSCULAR | Status: DC | PRN
  Administered 2012-12-26 (×4): 50 ug via INTRAVENOUS

## 2012-12-26 MED ORDER — ACETAMINOPHEN 650 MG RE SUPP: 650.0000 mg | RECTAL | Status: DC | PRN

## 2012-12-26 MED ORDER — CYCLOBENZAPRINE HCL 10 MG PO TABS: 10.0000 mg | ORAL_TABLET | Freq: Three times a day (TID) | ORAL | Status: DC | PRN

## 2012-12-26 MED ORDER — DEXAMETHASONE SODIUM PHOSPHATE 10 MG/ML IJ SOLN
INTRAMUSCULAR | Status: AC
Start: 2012-12-26 — End: 2012-12-27
  Filled 2012-12-26: qty 1

## 2012-12-26 MED ORDER — SODIUM CHLORIDE 0.9 % IJ SOLN
3.0000 mL | Freq: Two times a day (BID) | INTRAMUSCULAR | Status: DC
  Administered 2012-12-26 – 2012-12-28 (×4): 3 mL via INTRAVENOUS

## 2012-12-26 MED ORDER — HYDROMORPHONE HCL PF 1 MG/ML IJ SOLN: 0.2500 mg | INTRAMUSCULAR | Status: DC | PRN

## 2012-12-26 MED ORDER — LIDOCAINE HCL (CARDIAC) 20 MG/ML IV SOLN
INTRAVENOUS | Status: DC | PRN
  Administered 2012-12-26: 100 mg via INTRAVENOUS

## 2012-12-26 MED ORDER — HYDROMORPHONE HCL PF 1 MG/ML IJ SOLN
INTRAMUSCULAR | Status: AC
  Filled 2012-12-26: qty 2

## 2012-12-26 MED ORDER — FUROSEMIDE 40 MG PO TABS
40.0000 mg | ORAL_TABLET | Freq: Two times a day (BID) | ORAL | Status: DC
  Administered 2012-12-26 – 2012-12-29 (×6): 40 mg via ORAL
  Filled 2012-12-26 (×8): qty 1

## 2012-12-26 MED ORDER — ROPINIROLE HCL 1 MG PO TABS
2.0000 mg | ORAL_TABLET | Freq: Two times a day (BID) | ORAL | Status: DC
  Administered 2012-12-26 – 2012-12-29 (×6): 2 mg via ORAL
  Filled 2012-12-26 (×8): qty 2

## 2012-12-26 MED ORDER — HYDROMORPHONE HCL PF 1 MG/ML IJ SOLN
0.2500 mg | INTRAMUSCULAR | Status: DC | PRN
  Administered 2012-12-26: 0.5 mg via INTRAVENOUS

## 2012-12-26 MED ORDER — PHENYLEPHRINE HCL 10 MG/ML IJ SOLN
INTRAMUSCULAR | Status: DC | PRN
  Administered 2012-12-26: 80 ug via INTRAVENOUS
  Administered 2012-12-26: 40 ug via INTRAVENOUS
  Administered 2012-12-26: 80 ug via INTRAVENOUS
  Administered 2012-12-26: 40 ug via INTRAVENOUS
  Administered 2012-12-26 (×2): 80 ug via INTRAVENOUS

## 2012-12-26 MED ORDER — OXYCODONE HCL 5 MG/5ML PO SOLN: 5.0000 mg | Freq: Once | ORAL | Status: DC | PRN

## 2012-12-26 MED ORDER — WARFARIN SODIUM 5 MG PO TABS: 5.0000 mg | ORAL_TABLET | Freq: Every day | ORAL | Status: DC

## 2012-12-26 MED ORDER — WARFARIN - PHYSICIAN DOSING INPATIENT
Freq: Every day | Status: DC
  Administered 2012-12-28: 18:00:00

## 2012-12-26 MED ORDER — DOCUSATE SODIUM 100 MG PO CAPS
100.0000 mg | ORAL_CAPSULE | Freq: Two times a day (BID) | ORAL | Status: DC
  Administered 2012-12-26 – 2012-12-29 (×6): 100 mg via ORAL
  Filled 2012-12-26 (×6): qty 1

## 2012-12-26 MED ORDER — ENALAPRIL MALEATE 20 MG PO TABS
20.0000 mg | ORAL_TABLET | Freq: Every day | ORAL | Status: DC
  Administered 2012-12-26 – 2012-12-29 (×4): 20 mg via ORAL
  Filled 2012-12-26 (×4): qty 1

## 2012-12-26 MED ORDER — ALBUMIN HUMAN 5 % IV SOLN
INTRAVENOUS | Status: DC | PRN
  Administered 2012-12-26 (×2): via INTRAVENOUS

## 2012-12-26 MED ORDER — DEXAMETHASONE SODIUM PHOSPHATE 10 MG/ML IJ SOLN: 10.0000 mg | INTRAMUSCULAR | Status: DC

## 2012-12-26 MED ORDER — SODIUM CHLORIDE 0.9 % IR SOLN
Status: DC | PRN
  Administered 2012-12-26: 15:00:00

## 2012-12-26 MED ORDER — ONDANSETRON HCL 4 MG/2ML IJ SOLN: 4.0000 mg | INTRAMUSCULAR | Status: DC | PRN

## 2012-12-26 MED ORDER — ARTIFICIAL TEARS OP OINT
TOPICAL_OINTMENT | OPHTHALMIC | Status: DC | PRN
  Administered 2012-12-26: 1 via OPHTHALMIC

## 2012-12-26 MED ORDER — PROPOFOL 10 MG/ML IV BOLUS
INTRAVENOUS | Status: DC | PRN
  Administered 2012-12-26: 150 mg via INTRAVENOUS

## 2012-12-26 MED ORDER — SODIUM CHLORIDE 0.9 % IV SOLN: 250.0000 mL | INTRAVENOUS | Status: DC

## 2012-12-26 MED ORDER — PHENOL 1.4 % MT LIQD: 1.0000 | OROMUCOSAL | Status: DC | PRN

## 2012-12-26 MED ORDER — DILTIAZEM HCL ER COATED BEADS 120 MG PO CP24
120.0000 mg | ORAL_CAPSULE | Freq: Every day | ORAL | Status: DC
  Administered 2012-12-26 – 2012-12-29 (×4): 120 mg via ORAL
  Filled 2012-12-26 (×4): qty 1

## 2012-12-26 MED ORDER — WARFARIN SODIUM 7.5 MG PO TABS
7.5000 mg | ORAL_TABLET | ORAL | Status: DC
  Administered 2012-12-26 – 2012-12-28 (×2): 7.5 mg via ORAL
  Filled 2012-12-26 (×2): qty 1

## 2012-12-26 MED ORDER — MENTHOL 3 MG MT LOZG: 1.0000 | LOZENGE | OROMUCOSAL | Status: DC | PRN

## 2012-12-26 MED ORDER — OXYCODONE HCL 5 MG PO TABS
5.0000 mg | ORAL_TABLET | Freq: Once | ORAL | Status: AC | PRN
  Administered 2012-12-26: 5 mg via ORAL

## 2012-12-26 MED ORDER — ALUM & MAG HYDROXIDE-SIMETH 200-200-20 MG/5ML PO SUSP: 30.0000 mL | Freq: Four times a day (QID) | ORAL | Status: DC | PRN

## 2012-12-26 MED ORDER — LIDOCAINE-EPINEPHRINE 1 %-1:100000 IJ SOLN
INTRAMUSCULAR | Status: DC | PRN
  Administered 2012-12-26: 10 mL

## 2012-12-26 MED ORDER — INSULIN ASPART 100 UNIT/ML ~~LOC~~ SOLN
0.0000 [IU] | Freq: Three times a day (TID) | SUBCUTANEOUS | Status: DC
  Administered 2012-12-27: 3 [IU] via SUBCUTANEOUS
  Administered 2012-12-27: 8 [IU] via SUBCUTANEOUS
  Administered 2012-12-27 – 2012-12-28 (×2): 11 [IU] via SUBCUTANEOUS
  Administered 2012-12-28: 3 [IU] via SUBCUTANEOUS
  Administered 2012-12-29: 5 [IU] via SUBCUTANEOUS

## 2012-12-26 MED ORDER — GABAPENTIN 300 MG PO CAPS
600.0000 mg | ORAL_CAPSULE | Freq: Two times a day (BID) | ORAL | Status: DC
  Administered 2012-12-26 – 2012-12-29 (×6): 600 mg via ORAL
  Filled 2012-12-26 (×8): qty 2

## 2012-12-26 MED ORDER — FERROUS SULFATE 325 (65 FE) MG PO TABS
325.0000 mg | ORAL_TABLET | Freq: Every day | ORAL | Status: DC
  Administered 2012-12-27 – 2012-12-29 (×3): 325 mg via ORAL
  Filled 2012-12-26 (×4): qty 1

## 2012-12-26 MED ORDER — NEOSTIGMINE METHYLSULFATE 1 MG/ML IJ SOLN
INTRAMUSCULAR | Status: DC | PRN
  Administered 2012-12-26: 4 mg via INTRAVENOUS

## 2012-12-26 MED ORDER — OXYCODONE-ACETAMINOPHEN 5-325 MG PO TABS
1.0000 | ORAL_TABLET | ORAL | Status: DC | PRN
  Administered 2012-12-27 – 2012-12-29 (×3): 2 via ORAL
  Filled 2012-12-26 (×3): qty 2

## 2012-12-26 MED ORDER — LACTATED RINGERS IV SOLN
INTRAVENOUS | Status: DC | PRN
  Administered 2012-12-26 (×3): via INTRAVENOUS

## 2012-12-26 MED ORDER — MECLIZINE HCL 25 MG PO TABS
25.0000 mg | ORAL_TABLET | Freq: Every day | ORAL | Status: DC
  Administered 2012-12-26 – 2012-12-28 (×3): 25 mg via ORAL
  Filled 2012-12-26 (×5): qty 1

## 2012-12-26 MED ORDER — OXYCODONE HCL 5 MG PO TABS
ORAL_TABLET | ORAL | Status: AC
  Filled 2012-12-26: qty 1

## 2012-12-26 MED ORDER — SODIUM CHLORIDE 0.9 % IV SOLN
INTRAVENOUS | Status: AC
  Filled 2012-12-26: qty 500

## 2012-12-26 SURGICAL SUPPLY — 80 items
ADH SKN CLS APL DERMABOND .7 (GAUZE/BANDAGES/DRESSINGS)
ADH SKN CLS LQ APL DERMABOND (GAUZE/BANDAGES/DRESSINGS) ×1
APL SKNCLS STERI-STRIP NONHPOA (GAUZE/BANDAGES/DRESSINGS) ×1
BAG DECANTER FOR FLEXI CONT (MISCELLANEOUS) ×2 IMPLANT
BENZOIN TINCTURE PRP APPL 2/3 (GAUZE/BANDAGES/DRESSINGS) ×2 IMPLANT
BLADE SURG 11 STRL SS (BLADE) ×2 IMPLANT
BLADE SURG ROTATE 9660 (MISCELLANEOUS) ×1 IMPLANT
BRUSH SCRUB EZ PLAIN DRY (MISCELLANEOUS) ×2 IMPLANT
BUR MATCHSTICK NEURO 3.0 LAGG (BURR) ×2 IMPLANT
BUR PRECISION FLUTE 6.0 (BURR) ×2 IMPLANT
CANISTER SUCTION 2500CC (MISCELLANEOUS) ×2 IMPLANT
CLOTH BEACON ORANGE TIMEOUT ST (SAFETY) ×2 IMPLANT
CONT SPEC 4OZ CLIKSEAL STRL BL (MISCELLANEOUS) ×4 IMPLANT
COVER BACK TABLE 24X17X13 BIG (DRAPES) IMPLANT
COVER TABLE BACK 60X90 (DRAPES) ×2 IMPLANT
CROSSLINK DANEK (Orthopedic Implant) ×2 IMPLANT
DECANTER SPIKE VIAL GLASS SM (MISCELLANEOUS) ×2 IMPLANT
DERMABOND ADHESIVE PROPEN (GAUZE/BANDAGES/DRESSINGS) ×1
DERMABOND ADVANCED (GAUZE/BANDAGES/DRESSINGS)
DERMABOND ADVANCED .7 DNX12 (GAUZE/BANDAGES/DRESSINGS) ×1 IMPLANT
DERMABOND ADVANCED .7 DNX6 (GAUZE/BANDAGES/DRESSINGS) IMPLANT
DRAPE C-ARM 42X72 X-RAY (DRAPES) ×4 IMPLANT
DRAPE LAPAROTOMY 100X72X124 (DRAPES) ×2 IMPLANT
DRAPE POUCH INSTRU U-SHP 10X18 (DRAPES) ×2 IMPLANT
DRAPE PROXIMA HALF (DRAPES) IMPLANT
DRAPE SURG 17X23 STRL (DRAPES) ×2 IMPLANT
DRSG OPSITE 4X5.5 SM (GAUZE/BANDAGES/DRESSINGS) ×3 IMPLANT
DURAPREP 26ML APPLICATOR (WOUND CARE) ×2 IMPLANT
ELECT CAUTERY BLADE 6.4 (BLADE) ×1 IMPLANT
ELECT REM PT RETURN 9FT ADLT (ELECTROSURGICAL) ×2
ELECTRODE REM PT RTRN 9FT ADLT (ELECTROSURGICAL) ×1 IMPLANT
EVACUATOR 3/16  PVC DRAIN (DRAIN) ×1
EVACUATOR 3/16 PVC DRAIN (DRAIN) ×1 IMPLANT
GAUZE SPONGE 4X4 16PLY XRAY LF (GAUZE/BANDAGES/DRESSINGS) ×1 IMPLANT
GLOVE BIO SURGEON STRL SZ8 (GLOVE) ×4 IMPLANT
GLOVE BIOGEL M 8.0 STRL (GLOVE) ×1 IMPLANT
GLOVE BIOGEL PI IND STRL 7.5 (GLOVE) IMPLANT
GLOVE BIOGEL PI INDICATOR 7.5 (GLOVE) ×1
GLOVE ECLIPSE 7.5 STRL STRAW (GLOVE) ×5 IMPLANT
GLOVE EXAM NITRILE LRG STRL (GLOVE) ×2 IMPLANT
GLOVE EXAM NITRILE MD LF STRL (GLOVE) IMPLANT
GLOVE EXAM NITRILE XL STR (GLOVE) IMPLANT
GLOVE EXAM NITRILE XS STR PU (GLOVE) IMPLANT
GLOVE INDICATOR 8.5 STRL (GLOVE) ×4 IMPLANT
GOWN BRE IMP SLV AUR LG STRL (GOWN DISPOSABLE) ×2 IMPLANT
GOWN BRE IMP SLV AUR XL STRL (GOWN DISPOSABLE) ×8 IMPLANT
GOWN STRL REIN 2XL LVL4 (GOWN DISPOSABLE) IMPLANT
KIT BASIN OR (CUSTOM PROCEDURE TRAY) ×2 IMPLANT
KIT INFUSE SMALL (Orthopedic Implant) ×1 IMPLANT
KIT ROOM TURNOVER OR (KITS) ×2 IMPLANT
MARKER SKIN DUAL TIP RULER LAB (MISCELLANEOUS) ×1 IMPLANT
MILL MEDIUM DISP (BLADE) ×1 IMPLANT
NDL HYPO 25X1 1.5 SAFETY (NEEDLE) ×1 IMPLANT
NEEDLE HYPO 25X1 1.5 SAFETY (NEEDLE) ×2 IMPLANT
NS IRRIG 1000ML POUR BTL (IV SOLUTION) ×2 IMPLANT
PACK LAMINECTOMY NEURO (CUSTOM PROCEDURE TRAY) ×2 IMPLANT
PAD ARMBOARD 7.5X6 YLW CONV (MISCELLANEOUS) ×6 IMPLANT
PENCIL BUTTON HOLSTER BLD 10FT (ELECTRODE) ×1 IMPLANT
PLATE BN 1.75-2.15XLO BAR (Orthopedic Implant) IMPLANT
RASP 3.0MM (RASP) ×2 IMPLANT
ROD DANEK 40MM (Rod) ×1 IMPLANT
ROD PREBENT 6.35X70 (Rod) ×2 IMPLANT
SCREW DANEK NONBREAK (Screw) ×1 IMPLANT
SCREW PEDICLE VA L635 6.5X45M (Screw) ×1 IMPLANT
SCREW PEDICLE VA L635 6.5X50M (Screw) ×1 IMPLANT
SCREW SET NON BREAK OFF (Screw) ×4 IMPLANT
SPACER CALIBER 10X22MM 11-15MM (Spacer) ×2 IMPLANT
SPONGE GAUZE 4X4 12PLY (GAUZE/BANDAGES/DRESSINGS) ×2 IMPLANT
SPONGE LAP 4X18 X RAY DECT (DISPOSABLE) IMPLANT
SPONGE SURGIFOAM ABS GEL 100 (HEMOSTASIS) ×2 IMPLANT
STRIP CLOSURE SKIN 1/2X4 (GAUZE/BANDAGES/DRESSINGS) ×3 IMPLANT
SUT VIC AB 0 CT1 18XCR BRD8 (SUTURE) ×2 IMPLANT
SUT VIC AB 0 CT1 8-18 (SUTURE) ×4
SUT VIC AB 2-0 CT1 18 (SUTURE) ×2 IMPLANT
SUT VICRYL 4-0 PS2 18IN ABS (SUTURE) ×2 IMPLANT
SYR 20ML ECCENTRIC (SYRINGE) ×2 IMPLANT
TOWEL OR 17X24 6PK STRL BLUE (TOWEL DISPOSABLE) ×2 IMPLANT
TOWEL OR 17X26 10 PK STRL BLUE (TOWEL DISPOSABLE) ×2 IMPLANT
TRAY FOLEY CATH 14FRSI W/METER (CATHETERS) ×2 IMPLANT
WATER STERILE IRR 1000ML POUR (IV SOLUTION) ×2 IMPLANT

## 2012-12-26 NOTE — Op Note (Signed)
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 with severe foraminal stenosis of the L5 and S1 nerve roots bilateral L5 and S1 radiculopathies and neurogenic claudication with back pain  Postoperative diagnosis: Same  Procedure: Decompressive lumbar laminectomy L5-S1 and excess and requiring more work than would be needed with a standard interbody fusion with complete medial facetectomies foraminotomies of the L5 and S1 nerve roots  #2 posterior lumbar interbody fusion L5-S1 using the globus caliber expandable peek cages packed with local autograft mixed with DBX and BMP  #3 pedicle screw fixation L5-S1 on the right side an L4-S1 on the left side  #4 exploration of fusion removal of hardware with cutting the rod between L3 and L4 on the patient's left side and L4 and L5 and the patient right side  #5 posterior lateral pieces L5-S1 using local are graft mixed DBX and BMP  #6 placement of large Hemovac drain  Surgeon: Phillip Meyer  Assistant: Phillip Meyer  Anesthesia: Gen.  EBL: Minimal  History of present illness: This is a very pleasant 72 year old gentleman is a long-standing back pain underwent a right lumbar fusion L5 many years ago and did very well however recently is a progress worsening back pain bilateral leg pain rating down L5-S1 nerve root pattern neurogenic claudication with back pain imaging revealed severe breakdown spinal stenosis foraminal stenosis at L5-S1 with severe foraminal stenosis of the L5 and S1 nerve roots bilaterally to this takes her treatment progression clinical syndrome and imaging findings I recommended exploration of fusion removal of hardware at L4-5 and L3-4 with a posterior lumbar interbody fusion decompressive laminectomy L5-S1 I extensively reviewed the risks and benefits of the operation as well as perioperative course expectations about alternatives of surgery he understood and agreed to proceed  forward.  Operative procedure: Patient brought into the or was induced under general anesthesia positioned prone the Wilson frame her back his back was prepped and draped in routine sterile fashion the inferior aspect of his old incision was opened up and extended inferiorly subperiosteal dissections care lamina of L5 and S1 bilaterally the hardware was exposed there was extensive amount of bony overgrowth oh overlying the L5 and L4 screws this was drilled away and bitten away to expose the hardware decompressive laminotomy was begun with removal of spinous process of L5 central decompression was begun as well complete medial facetectomies were performed and there was marked hourglass compression of thecal sac from severe facet arthropathy he was severe foraminal stenosis on the L5 nerve root from marked overgrowth of the superior articulating facet as well as the inferior articulating facet under the L5 nerve this is all removed in piecemeal fashion with a 3 mm Kerrison punch. Extensive foraminotomies were carried out well beyond the superior articulating facet as well as aggressive underbiting of that superior articulating facet the lateral access the disc space. Both L5 nerve roots were skeletonized flush with pedicle and both S1 nerve root foramina were also widely decompressed. After adequate decompression achieved this taken to pedicle screw placement pilot holes were drilled a high-speed drill using fluoroscopy cannulated with the awl probed O55 Probed again and 6 5 x 45 screws inserted on the right and 11/22/1948 on the left all screws excellent purchase fluoroscopy confirmed good position and direction. Cages and taken the interbody work this was cleaned out bilaterally a size 11 distractor was inserted this had good apposition the endplates and felt to be appropriate sizing for the expandable cages. So an 11 mm x  10 mm wide by 26 mm length 15 lordotic cages and packed with local autograft mixed DBX the  disc spaces radically cleaned out several large fragments removed the central compartment and plates were scraped with a rotating cutter and Epstein curettes and any 11 expandable cage was inserted on the left side this was opened up 7 turns which was proximal 14 mm. Then the distractor was removed the spaces cleanout the opposite side again a lot of central disc was removed the endplates were scraped local autograft and BMP was packed centrally and a right-sided cage was inserted and expanded in a similar fashion. Posterior fluoroscopy confirmed good position of all the implants the wound scope was irrigated meticulous hemostasis was maintained aggressive decortication was carried out along the TPS lateral gutters hooking into the posterior lateral fusion which was solid from L4-5 and L3-4. Local autograft mixed with DBX and BMP was then packed posterior laterally. From L4-S1 on the patient's left side and held for 5 in the right side all the foraminal reinspected confirm patency rods were placed all nuts were tightened down the cross-link was inserted between L5 and S1 and this was tightened as well the construct appeared to be solid Gelfoam was overlaid top of the dura large director was placed and the wounds closed in layers with after Vicryl and the muscle subcutaneous tissue and fascia and the skin was closed with a running 4 subcuticular Dermabond benzo and Steri-Stripped roll applied the wound was dressed the patient recovered in stable condition. At the the case all needle counts and sponge counts were correct.

## 2012-12-26 NOTE — Anesthesia Postprocedure Evaluation (Signed)
  Anesthesia Post-op Note  [name]  Procedure(s) Performed: Procedure(s) with comments: POSTERIOR LUMBAR FUSION 1 LEVEL (N/A) - Lumbar five-sacral one Posterior Lumbar Interbody Fusion  Patient Location: PACU  Anesthesia Type:General  Level of Consciousness: awake and alert   Airway and Oxygen Therapy: Patient Spontanous Breathing and Patient connected to nasal cannula oxygen  Post-op Pain: mild  Post-op Assessment: Post-op Vital signs reviewed, Patient's Cardiovascular Status Stable, Respiratory Function Stable, Patent Airway and No signs of Nausea or vomiting  Post-op Vital Signs: Reviewed and stable  Complications: No apparent anesthesia complications

## 2012-12-26 NOTE — H&P (Signed)
Phillip Meyer is an 72 y.o. male.   Chief Complaint: Back and bilateral leg pain HPI: Patient is a very pleasant 72 year old gentleman had undergone previous rectal lumbar fusion down L5 many years ago and did very well however is at rest worsening back and bilateral leg pain rating down L5 and S1 nerve root pattern workup with CT scan MRI scan showed progressive deterioration the L5-S1 disc space below his fusion. Marked facet arthropathy lateral recess stenosis and foraminal stenosis on both the L5 and S1 nerve roots. Today's progression of clinical syndrome failure of conservative treatment and imaging findings I recommended a posterior lumbar interbody fusion L5-S1 with decompressive laminectomies at that level is as well as exploration of fusion with cutting of the hardware between L3-4 and L4-5 with extension and new rods placed. I have reviewed the risks and benefits of the operation with the patient as well as perioperative course expectations of outcome and alternatives to surgery and he understood and agreed to proceed forward.  Past Medical History  Diagnosis Date  . Hypertension   . Dysrhythmia     at fib  . Diabetes mellitus without complication   . CHF (congestive heart failure)   . Arthritis     Past Surgical History  Procedure Laterality Date  . Back surgery      x5    History reviewed. No pertinent family history. Social History:  reports that he has never smoked. He does not have any smokeless tobacco history on file. He reports that he does not drink alcohol or use illicit drugs.  Allergies:  Allergies  Allergen Reactions  . Elavil (Amitriptyline) Other (See Comments)    Passes out    Medications Prior to Admission  Medication Sig Dispense Refill  . aspirin EC 81 MG tablet Take 81 mg by mouth daily.      Marland Kitchen diltiazem (CARDIZEM CD) 120 MG 24 hr capsule Take 120 mg by mouth daily.      . enalapril (VASOTEC) 20 MG tablet Take 20 mg by mouth daily.      . ferrous  sulfate 325 (65 FE) MG tablet Take 325 mg by mouth daily with breakfast.      . furosemide (LASIX) 40 MG tablet Take 40 mg by mouth 2 (two) times daily.      Marland Kitchen gabapentin (NEURONTIN) 600 MG tablet Take 600 mg by mouth 2 (two) times daily.      . meclizine (ANTIVERT) 25 MG tablet Take 25 mg by mouth at bedtime.      . Omega-3 Fatty Acids (FISH OIL) 1200 MG CAPS Take 1 capsule by mouth 2 (two) times daily.      Marland Kitchen rOPINIRole (REQUIP) 2 MG tablet Take 2 mg by mouth 2 (two) times daily.      . simvastatin (ZOCOR) 10 MG tablet Take 10 mg by mouth at bedtime.      . sotalol (BETAPACE) 80 MG tablet Take 80 mg by mouth 2 (two) times daily.      Marland Kitchen warfarin (COUMADIN) 5 MG tablet Take 5-7.5 mg by mouth daily. Take 1.5 tablet on Mon, Wed, and Fri. Take 1 tablet all other days        Results for orders placed during the hospital encounter of 12/26/12 (from the past 48 hour(s))  GLUCOSE, CAPILLARY     Status: Abnormal   Collection Time    12/26/12 12:42 PM      Result Value Range   Glucose-Capillary 153 (*) 70 - 99  mg/dL  APTT     Status: None   Collection Time    12/26/12 12:56 PM      Result Value Range   aPTT 26  24 - 37 seconds  PROTIME-INR     Status: None   Collection Time    12/26/12 12:56 PM      Result Value Range   Prothrombin Time 13.7  11.6 - 15.2 seconds   INR 1.07  0.00 - 1.49   No results found.  Review of Systems  Constitutional: Negative.   HENT: Negative.   Eyes: Negative.   Respiratory: Negative.   Cardiovascular: Negative.   Gastrointestinal: Negative.   Genitourinary: Negative.   Musculoskeletal: Positive for back pain, joint pain and falls.  Skin: Negative.   Neurological: Positive for tingling and sensory change.  Endo/Heme/Allergies: Negative.   Psychiatric/Behavioral: Negative.     Blood pressure 118/72, pulse 79, temperature 97.9 F (36.6 C), temperature source Oral, resp. rate 20, SpO2 95.00%. Physical Exam  Constitutional: He is oriented to person,  place, and time. He appears well-developed and well-nourished.  HENT:  Head: Normocephalic and atraumatic.  Eyes: Pupils are equal, round, and reactive to light.  Neck: Normal range of motion.  Cardiovascular: Normal rate.   Respiratory: Effort normal.  GI: Soft.  Musculoskeletal: Normal range of motion.  Neurological: He is alert and oriented to person, place, and time. He displays atrophy. He exhibits abnormal muscle tone. GCS eye subscore is 4. GCS verbal subscore is 5. GCS motor subscore is 6.  Reflex Scores:      Patellar reflexes are 0 on the right side and 0 on the left side.      Achilles reflexes are 0 on the right side and 0 on the left side. 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     Assessment/Plan 70 or gentleman presents for an L5-S1 plus with exploration of fusion removal of hardware cutting is rising 34, 45  Phillip Meyer P 12/26/2012, 1:57 PM

## 2012-12-26 NOTE — Anesthesia Procedure Notes (Signed)
Procedure Name: Intubation Date/Time: 12/26/2012 2:27 PM Performed by: Luster Landsberg Pre-anesthesia Checklist: Patient identified, Emergency Drugs available, Suction available and Patient being monitored Patient Re-evaluated:Patient Re-evaluated prior to inductionOxygen Delivery Method: Circle system utilized Preoxygenation: Pre-oxygenation with 100% oxygen Intubation Type: IV induction Ventilation: Mask ventilation without difficulty and Oral airway inserted - appropriate to patient size Laryngoscope Size: Mac and 3 Grade View: Grade I Tube type: Oral Tube size: 7.5 mm Number of attempts: 1 Airway Equipment and Method: Stylet Placement Confirmation: ETT inserted through vocal cords under direct vision,  positive ETCO2 and breath sounds checked- equal and bilateral Secured at: 23 cm Tube secured with: Tape Dental Injury: Teeth and Oropharynx as per pre-operative assessment

## 2012-12-26 NOTE — Transfer of Care (Signed)
Immediate Anesthesia Transfer of Care Note  [name]  Procedure(s) Performed: Procedure(s) with comments: POSTERIOR LUMBAR FUSION 1 LEVEL (N/A) - Lumbar five-sacral one Posterior Lumbar Interbody Fusion  Patient Location: PACU  Anesthesia Type:General  Level of Consciousness: awake and confused  Airway & Oxygen Therapy: Patient Spontanous Breathing and Patient connected to face mask oxygen  Post-op Assessment: Report given to PACU RN, Post -op Vital signs reviewed and stable and Patient moving all extremities X 4  Post vital signs: Reviewed and stable  Complications: No apparent anesthesia complications

## 2012-12-26 NOTE — Anesthesia Preprocedure Evaluation (Addendum)
Anesthesia Evaluation  Patient identified by MRN, date of birth, ID band Patient awake    Reviewed: Allergy & Precautions, H&P , NPO status , Patient's Chart, lab work & pertinent test results  History of Anesthesia Complications Negative for: history of anesthetic complications  Airway Mallampati: II TM Distance: >3 FB Neck ROM: Full    Dental  (+) Partial Upper, Caps, Dental Advisory Given and Missing,    Pulmonary neg pulmonary ROS,    Pulmonary exam normal       Cardiovascular hypertension, +CHF + dysrhythmias Atrial Fibrillation Rhythm:Irregular Rate:Normal   Echo 11/22/12. Results showed: normal LV size and thickness, mildly reduced LV systolic function, EF 45-50%, mild global hypokinesis of the LV.  LA moderately dilated.  Mild MR/TR.  Mild pulmonary HTN.  No pericardial effusion.     Neuro/Psych negative psych ROS   GI/Hepatic negative GI ROS, Neg liver ROS,   Endo/Other  diabetes, Oral Hypoglycemic AgentsMorbid obesity  Renal/GU negative Renal ROS     Musculoskeletal   Abdominal   Peds  Hematology negative hematology ROS (+)   Anesthesia Other Findings   Reproductive/Obstetrics                         Anesthesia Physical Anesthesia Plan  ASA: III  Anesthesia Plan: General   Post-op Pain Management:    Induction: Intravenous  Airway Management Planned: Oral ETT  Additional Equipment:   Intra-op Plan:   Post-operative Plan: Extubation in OR  Informed Consent: I have reviewed the patients History and Physical, chart, labs and discussed the procedure including the risks, benefits and alternatives for the proposed anesthesia with the patient or authorized representative who has indicated his/her understanding and acceptance.   Dental advisory given  Plan Discussed with: CRNA, Anesthesiologist and Surgeon  Anesthesia Plan Comments:         Anesthesia Quick Evaluation

## 2012-12-26 NOTE — Preoperative (Signed)
Beta Blockers   Reason not to administer Beta Blockers:Not Applicable  Pt took am Sotalol

## 2012-12-27 LAB — GLUCOSE, CAPILLARY
Glucose-Capillary: 200 mg/dL — ABNORMAL HIGH (ref 70–99)
Glucose-Capillary: 274 mg/dL — ABNORMAL HIGH (ref 70–99)
Glucose-Capillary: 302 mg/dL — ABNORMAL HIGH (ref 70–99)

## 2012-12-27 LAB — PROTIME-INR: INR: 1.11 (ref 0.00–1.49)

## 2012-12-27 MED ORDER — INSULIN ASPART 100 UNIT/ML ~~LOC~~ SOLN
30.0000 [IU] | Freq: Three times a day (TID) | SUBCUTANEOUS | Status: DC
  Administered 2012-12-28 – 2012-12-29 (×4): 30 [IU] via SUBCUTANEOUS

## 2012-12-27 MED ORDER — INSULIN GLARGINE 100 UNIT/ML ~~LOC~~ SOLN
60.0000 [IU] | Freq: Every day | SUBCUTANEOUS | Status: DC
  Administered 2012-12-27 – 2012-12-28 (×2): 60 [IU] via SUBCUTANEOUS
  Filled 2012-12-27 (×3): qty 0.6

## 2012-12-27 MED FILL — Sodium Chloride IV Soln 0.9%: INTRAVENOUS | Qty: 1000 | Status: AC

## 2012-12-27 MED FILL — Sodium Chloride Irrigation Soln 0.9%: Qty: 3000 | Status: AC

## 2012-12-27 MED FILL — Heparin Sodium (Porcine) Inj 1000 Unit/ML: INTRAMUSCULAR | Qty: 30 | Status: AC

## 2012-12-27 NOTE — Evaluation (Signed)
Physical Therapy Evaluation Patient Details Name: Phillip Meyer MRN: [id number] DOB: [birth year] Today's Date: 12/27/2012 Time: 4098-1191 PT Time Calculation (min): 76 min  PT Assessment / Plan / Recommendation History of Present Illness  72 y/o male s/p PLIF L5-S1.   Clinical Impression  Presents to PT with below impairments (see problem list) impacting functional independence. Will benefit physical therapy in the acute setting to maximize safe mobility while adhering to back precautions. Will have good support from family. Rec. OPPT when cleared by surgeon for continued balance and strengthening exercises.     PT Assessment  Patient needs continued PT services    Follow Up Recommendations  Supervision/Assistance - 24 hour;Outpatient PT (when cleared by surgeon)    Does the patient have the potential to tolerate intense rehabilitation      Barriers to Discharge        Equipment Recommendations  None recommended by PT    Recommendations for Other Services     Frequency Min 5X/week    Precautions / Restrictions Precautions Precautions: Fall;Back Precaution Comments: educated on 3/3 back precautions Required Braces or Orthoses: Other Brace/Splint Other Brace/Splint: LLE AFO   Pertinent Vitals/Pain Reports minimal pain, Rn provided pain meds prior     Mobility  Bed Mobility Bed Mobility: Rolling Left;Left Sidelying to Sit Rolling Left: 4: Min assist Left Sidelying to Sit: 4: Min assist Details for Bed Mobility Assistance: sequencing cues especially how to get legs off the bed Transfers Transfers: Sit to Stand;Stand to Sit Sit to Stand: 4: Min guard;From bed;With upper extremity assist Stand to Sit: 4: Min guard;To chair/3-in-1;With upper extremity assist;With armrests Details for Transfer Assistance: cues for safe hadn placement Ambulation/Gait Ambulation/Gait Assistance: 4: Min guard Ambulation Distance (Feet): 200 Feet Assistive device: Rolling  walker Ambulation/Gait Assistance Details: cues for tall posture and safe proxmity to RW Gait Pattern: Step-through pattern;Trunk flexed;Left steppage Stairs: No    Exercises     PT Diagnosis: Difficulty walking;Acute pain  PT Problem List: Decreased activity tolerance;Decreased mobility;Decreased knowledge of use of DME;Decreased knowledge of precautions;Obesity PT Treatment Interventions: DME instruction;Gait training;Functional mobility training;Therapeutic activities;Therapeutic exercise;Patient/family education     PT Goals(Current goals can be found in the care plan section) Acute Rehab PT Goals Patient Stated Goal: to be independent PT Goal Formulation: With patient Time For Goal Achievement: 01/03/13 Potential to Achieve Goals: Good  Visit Information  Last PT Received On: 12/27/12 Assistance Needed: +1 History of Present Illness: 72 y/o male s/p PLIF L5-S1.        Prior Functioning  Home Living Family/patient expects to be discharged to:: Private residence Living Arrangements: Alone Available Help at Discharge: Family;Available 24 hours/day (until Aug.) Type of Home: House Home Access: Ramped entrance Home Layout: One level Home Equipment: Walker - 2 wheels;Cane - quad;Cane - single point;Grab bars - tub/shower;Grab bars - toilet;Shower seat Prior Function Level of Independence: Independent with assistive device(s) Communication Communication: No difficulties    Cognition  Cognition Arousal/Alertness: Awake/alert Behavior During Therapy: WFL for tasks assessed/performed Overall Cognitive Status: Within Functional Limits for tasks assessed    Extremity/Trunk Assessment Upper Extremity Assessment Upper Extremity Assessment: Defer to OT evaluation Lower Extremity Assessment Lower Extremity Assessment: Overall WFL for tasks assessed   Balance    End of Session PT - End of Session Equipment Utilized During Treatment: Gait belt Activity Tolerance: Patient  tolerated treatment well Patient left: in chair;with call bell/phone within reach Nurse Communication: Mobility status  GP     Phillip Meyer 12/27/2012, 1:50  PM   

## 2012-12-27 NOTE — Progress Notes (Signed)
UR COMPLETED  

## 2012-12-27 NOTE — Progress Notes (Signed)
Patient ID: Phillip Meyer, male   DOB: 07-Apr-1941, 72 y.o.   MRN: [id number] Mr. looks great minimal leg pain back pain is manageable he is and live with physical therapy in his brace his wound is clean and dry her strength is at its baseline

## 2012-12-27 NOTE — Progress Notes (Signed)
Occupational Therapy Evaluation Patient Details Name: Phillip Meyer MRN: [id number] DOB: 06-Nov-1940 Today's Date: 12/27/2012 Time: 7829-5621 OT Time Calculation (min): 34 min  OT Assessment / Plan / Recommendation History of present illness 72 y/o male s/p PLIF L5-S1.    Clinical Impression   PTA pt independent with assistive device (cane) Pt is a pleasant man with a very supportive family. Pt will benefit from skilled OT while in the acute setting to incr independence in ADL and maintaining precautions before D/C home with OPOT.    OT Assessment  Patient needs continued OT Services    Follow Up Recommendations  Outpatient OT       Equipment Recommendations  None recommended by OT          Precautions / Restrictions Precautions Precautions: Fall;Back Precaution Booklet Issued: Yes (comment) Precaution Comments: educated on 3/3 back precautions Required Braces or Orthoses: Other Brace/Splint Other Brace/Splint: LLE AFO Restrictions Weight Bearing Restrictions: No   Pertinent Vitals/Pain Pt reported no pain during session    ADL  Eating/Feeding: Independent Where Assessed - Eating/Feeding: Chair Grooming: Wash/dry hands;Wash/dry face;Teeth care;Modified independent Where Assessed - Grooming: Supported standing Upper Body Bathing: Modified independent Where Assessed - Upper Body Bathing: Unsupported sitting Lower Body Bathing: Minimal assistance Where Assessed - Lower Body Bathing: Unsupported sitting Upper Body Dressing: Modified independent Where Assessed - Upper Body Dressing: Unsupported sitting Lower Body Dressing: Maximal assistance Where Assessed - Lower Body Dressing: Supported sit to stand Toilet Transfer: Supervision/safety Statistician Method: Sit to Barista: Comfort height toilet;Grab bars Toileting - Architect and Hygiene: Modified independent Where Assessed - Engineer, mining and Hygiene: Sit to  stand from 3-in-1 or toilet Tub/Shower Transfer: Modified independent Tub/Shower Transfer Method: Science writer: Walk in shower;Grab bars Equipment Used: Back brace;Gait belt;Other (comment) (educated on hip kit AE) Transfers/Ambulation Related to ADLs: Pt supervision for transfers and ambulation ADL Comments: Pt struggled most with LLE AFO for foot drop. Pt was educated on hip kit AE tools to address lower body bathing and dressing and on how to get the kit. Pt performed sink level ADL with UE support on sink counter with min vc's to prevent twisting.     OT Goals(Current goals can be found in the care plan section) Acute Rehab OT Goals Patient Stated Goal: to be independent ADL Goals Pt Will Perform Lower Body Dressing: with modified independence;with adaptive equipment;sit to/from stand Additional ADL Goal #1: Pt will be able to recall and maintain 3/3 back precautions during ADL and mobility with less than 3 cues  Visit Information  Last OT Received On: 12/27/12 Assistance Needed: +1 History of Present Illness: 72 y/o male s/p PLIF L5-S1.        Prior Functioning     Home Living Family/patient expects to be discharged to:: Private residence Living Arrangements: Alone Available Help at Discharge: Family;Available 24 hours/day Type of Home: House Home Access: Ramped entrance Home Layout: One level Home Equipment: Walker - 2 wheels;Cane - quad;Cane - single point;Grab bars - tub/shower;Grab bars - toilet;Shower seat Prior Function Level of Independence: Independent with assistive device(s) Communication Communication: No difficulties Dominant Hand: Right         Vision/Perception Vision - History Baseline Vision: No visual deficits Patient Visual Report: No change from baseline Vision - Assessment Eye Alignment: Within Functional Limits   Cognition  Cognition Arousal/Alertness: Awake/alert Behavior During Therapy: WFL for tasks  assessed/performed Overall Cognitive Status: Within Functional Limits for tasks assessed  Extremity/Trunk Assessment Upper Extremity Assessment Upper Extremity Assessment: Defer to OT evaluation Lower Extremity Assessment Lower Extremity Assessment: Overall WFL for tasks assessed     Mobility Bed Mobility Bed Mobility: Not assessed Rolling Left: 4: Min assist Left Sidelying to Sit: 4: Min assist Details for Bed Mobility Assistance: sequencing cues especially how to get legs off the bed Transfers Transfers: Sit to Stand;Stand to Sit Sit to Stand: 5: Supervision;With upper extremity assist;From chair/3-in-1;From toilet Stand to Sit: 5: Supervision;With upper extremity assist;To chair/3-in-1;To toilet Details for Transfer Assistance: min vc's for safe hand placement           End of Session OT - End of Session Equipment Utilized During Treatment: Gait belt;Rolling walker;Back brace;Other (comment) (LLE AFO) Activity Tolerance: Patient tolerated treatment well Patient left: in chair;with call bell/phone within reach;with family/visitor present Nurse Communication: Mobility status;Precautions  GO     Sherryl Manges 12/27/2012, 3:42 PM

## 2012-12-27 NOTE — Progress Notes (Signed)
I have read and agree with the below note.  Maleik Vanderzee Helen Whitlow PT, DPT Pager: [phone] 

## 2012-12-27 NOTE — Progress Notes (Signed)
I agree with the following treatment note after reviewing documentation.   Johnston, Caryn Gienger Brynn   OTR/L Pager: [phone] Office: [phone] .   

## 2012-12-27 NOTE — Progress Notes (Signed)
OT Cancellation Note  Patient Details Name: RACHID PARHAM MRN: [id number] DOB: 15-Jan-1941   Cancelled Treatment:    Reason Eval/Treat Not Completed: Patient not medically ready (no back brace present at this time) OT / PT to reattempt when back brace present and safe to mobilize patient.  Harrel Carina [place] Pager: [phone]  12/27/2012, 10:08 AM

## 2012-12-27 NOTE — Progress Notes (Signed)
Inpatient Diabetes Program Recommendations  AACE/ADA: New Consensus Statement on Inpatient Glycemic Control (2013)  Target Ranges:  Prepandial:   less than 140 mg/dL      Peak postprandial:   less than 180 mg/dL (1-2 hours)      Critically ill patients:  140 - 180 mg/dL  Results for Phillip Meyer, Phillip Meyer (MRN [id number]) as of 12/27/2012 16:25  Ref. Range 12/26/2012 22:50 12/27/2012 07:00 12/27/2012 11:49 12/27/2012 14:39 12/27/2012 16:13  Glucose-Capillary Latest Range: 70-99 mg/dL 119 (H) 147 (H) 829 (H) 274 (H) 302 (H)    Inpatient Diabetes Program Recommendations Insulin - Basal: consider adding Lantus 15 units  May need RESISTANT scale Novolog Thank you  Piedad Climes BSN, RN,CDE Inpatient Diabetes Coordinator [phone] (team pager)

## 2012-12-27 NOTE — Progress Notes (Signed)
Orthopedic Tech Progress Note Patient Details:  Phillip Meyer 1940/12/26 [id number] Brace order completed by [employer] vendor [place]. Patient ID: Phillip Meyer, male   DOB: 30-Aug-1940, 72 y.o.   MRN: [id number]   Jennye Moccasin 12/27/2012, 4:06 PM

## 2012-12-28 LAB — GLUCOSE, CAPILLARY
Glucose-Capillary: 193 mg/dL — ABNORMAL HIGH (ref 70–99)
Glucose-Capillary: 101 mg/dL — ABNORMAL HIGH (ref 70–99)
Glucose-Capillary: 138 mg/dL — ABNORMAL HIGH (ref 70–99)

## 2012-12-28 LAB — PROTIME-INR: Prothrombin Time: 15 seconds (ref 11.6–15.2)

## 2012-12-28 NOTE — Progress Notes (Signed)
Subjective: Patient reports Much better pain is all localized to his back it is manageable however is deep Hemovac is still putting out output greater than what I took over with leaving in taking out   Objective: Vital signs in last 24 hours: Temp:  [97.9 F (36.6 C)-98.7 F (37.1 C)] 98.7 F (37.1 C) (07/11 1323) Pulse Rate:  [56-91] 56 (07/11 1323) Resp:  [18-20] 20 (07/11 1323) BP: (73-136)/(31-67) 122/48 mmHg (07/11 1323) SpO2:  [92 %-98 %] 95 % (07/11 1323)  Intake/Output from previous day: 07/10 0701 - 07/11 0700 In: 555 [P.O.:480] Out: 210 [Drains:210] Intake/Output this shift: Total I/O In: 600 [P.O.:600] Out: -   Patient is awake alert oriented strength out of 5 wound is clean and dry  Lab Results: No results found for this basename: WBC, HGB, HCT, PLT,  in the last 72 hours BMET No results found for this basename: NA, K, CL, CO2, GLUCOSE, BUN, CREATININE, CALCIUM,  in the last 72 hours  Studies/Results: Dg Lumbar Spine 2-3 Views  12/26/2012   *RADIOLOGY REPORT*  Clinical Data: L5-S1 fusion.  LUMBAR SPINE - 2-3 VIEW,DG C-ARM 61-120 MIN  Fluoroscopic time:  36 seconds.  Comparison: 10/31/2012 MR.  Findings: Two intraoperative C-arm views submitted for review after surgery.  Prior fusion with inferior aspect previously noted at the L5 level has been revised and now incorporates fusion at the L5-S1 level with bilateral pedicle screws in place and interbody spacer.  No complication noted.  This can be assessed on follow-up. Surgical sponge may be in place on the frontal view.  IMPRESSION: Fusion L5-S1.  Please see above.   Original Report Authenticated By: Lacy Duverney, M.D.   Dg C-arm [phone] Min  12/26/2012   *RADIOLOGY REPORT*  Clinical Data: L5-S1 fusion.  LUMBAR SPINE - 2-3 VIEW,DG C-ARM 61-120 MIN  Fluoroscopic time:  36 seconds.  Comparison: 10/31/2012 MR.  Findings: Two intraoperative C-arm views submitted for review after surgery.  Prior fusion with inferior aspect  previously noted at the L5 level has been revised and now incorporates fusion at the L5-S1 level with bilateral pedicle screws in place and interbody spacer.  No complication noted.  This can be assessed on follow-up. Surgical sponge may be in place on the frontal view.  IMPRESSION: Fusion L5-S1.  Please see above.   Original Report Authenticated By: Lacy Duverney, M.D.    Assessment/Plan: Posterior they to doing very well status post left we'll keep him 1 more day and discontinue Hemovacs more probable discharge tomorrow  LOS: 2 days     Roseline Ebarb P 12/28/2012, 2:55 PM

## 2012-12-28 NOTE — Care Management Note (Unsigned)
    Page 1 of 1   12/28/2012     2:52:09 PM   CARE MANAGEMENT NOTE 12/28/2012  Patient:  Phillip Meyer, Phillip Meyer   Account Number:  [id number]  Date Initiated:  12/28/2012  Documentation initiated by:  Elmer Bales  Subjective/Objective Assessment:   Pt admitted for a lumbar laminectomy.     Action/Plan:   Will follow for discharge needs.   Anticipated DC Date:  12/28/2012   Anticipated DC Plan:  HOME/SELF CARE      DC Planning Services  CM consult      Choice offered to / List presented to:  C-1 Patient           Status of service:  Completed, signed off Medicare Important Message given?   (If response is "NO", the following Medicare IM given date fields will be blank) Date Medicare IM given:   Date Additional Medicare IM given:    Discharge Disposition:    Per UR Regulation:  Reviewed for med. necessity/level of care/duration of stay  If discussed at Long Length of Stay Meetings, dates discussed:    Comments:  12/28/12 1446 Elmer Bales RN, MSN, CM- Met with patient to discuss outpatient PT.  Pt states that he only wants to do PT in [place], but is not interested in going to [facility], which would be the most appropriate for his needs.  CM spoke with PT/OT who states that he is progressing very well at this point and does not need outpatient PT at this time.  Pt and RN updated.

## 2012-12-28 NOTE — Progress Notes (Signed)
Physical Therapy Treatment Patient Details Name: Phillip Meyer MRN: [id number] DOB: [birth year] Today's Date: 12/28/2012 Time: 1100-1115 PT Time Calculation (min): 15 min  PT Assessment / Plan / Recommendation  PT Comments   Ambulating x2 with family this morning. Agreeable to ambulation with me. Needs cues for safe posture and back precautions during movement. Family very involved.   Follow Up Recommendations  Supervision/Assistance - 24 hour;Outpatient PT (when cleared by surgeon)     Does the patient have the potential to tolerate intense rehabilitation     Barriers to Discharge        Equipment Recommendations  None recommended by PT    Recommendations for Other Services    Frequency Min 5X/week   Progress towards PT Goals Progress towards PT goals: Progressing toward goals  Plan Current plan remains appropriate    Precautions / Restrictions Precautions Precautions: Fall;Back Precaution Comments: educated on 3/3 back precautions Other Brace/Splint: LLE AFO   Pertinent Vitals/Pain Reports minimal pain    Mobility  Bed Mobility Bed Mobility: Rolling Right;Right Sidelying to Sit;Sit to Sidelying Right Rolling Right: 6: Modified independent (Device/Increase time);With rail Right Sidelying to Sit: 6: Modified independent (Device/Increase time) Sit to Sidelying Right: 6: Modified independent (Device/Increase time) Transfers Transfers: Sit to Stand;Stand to Sit Sit to Stand: 5: Supervision;From bed Stand to Sit: To bed Details for Transfer Assistance: cues for safe posture and decreased bending when standing Ambulation/Gait Ambulation/Gait Assistance: 5: Supervision Ambulation Distance (Feet): 500 Feet Assistive device: Rolling walker Ambulation/Gait Assistance Details: cues for tall posture, safe positioning with RW and safe speed especially during turns and when distracted Gait Pattern: Step-through pattern;Trunk flexed;Left steppage Stairs: No      PT Goals  (current goals can now be found in the care plan section)    Visit Information  Last PT Received On: 12/28/12 History of Present Illness: 72 y/o male s/p PLIF L5-S1.     Subjective Data      Cognition  Cognition Arousal/Alertness: Awake/alert Behavior During Therapy: WFL for tasks assessed/performed Overall Cognitive Status: Within Functional Limits for tasks assessed    Balance     End of Session PT - End of Session Equipment Utilized During Treatment: Gait belt Activity Tolerance: Patient tolerated treatment well Patient left: in bed;with call bell/phone within reach Nurse Communication: Mobility status   GP     [facility] HELEN 12/28/2012, 12:38 PM

## 2012-12-29 LAB — PROTIME-INR
INR: 1.32 (ref 0.00–1.49)
Prothrombin Time: 16.1 seconds — ABNORMAL HIGH (ref 11.6–15.2)

## 2012-12-29 MED ORDER — CYCLOBENZAPRINE HCL 10 MG PO TABS: 10.0000 mg | ORAL_TABLET | Freq: Three times a day (TID) | ORAL | Status: DC | PRN

## 2012-12-29 MED ORDER — OXYCODONE-ACETAMINOPHEN 5-325 MG PO TABS: 1.0000 | ORAL_TABLET | ORAL | Status: DC | PRN

## 2012-12-29 NOTE — Progress Notes (Signed)
   CARE MANAGEMENT NOTE 12/29/2012  Patient:  Phillip Meyer, Phillip Meyer   Account Number:  [id number]  Date Initiated:  12/28/2012  Documentation initiated by:  Elmer Bales  Subjective/Objective Assessment:   Pt admitted for a lumbar laminectomy.     Action/Plan:   Will follow for discharge needs.   Anticipated DC Date:  12/28/2012   Anticipated DC Plan:  HOME/SELF CARE      DC Planning Services  CM consult      Choice offered to / List presented to:  C-1 Patient        HH arranged  HH-2 PT  HH-1 RN      [facility] agency  [facility]   Status of service:  Completed, signed off Medicare Important Message given?   (If response is "NO", the following Medicare IM given date fields will be blank) Date Medicare IM given:   Date Additional Medicare IM given:    Discharge Disposition:    Per UR Regulation:  Reviewed for med. necessity/level of care/duration of stay  If discussed at Long Length of Stay Meetings, dates discussed:    Comments:  12/29/2012 1100 NCM offered choice for [facility]. Pt gave permission to speak to son, [name]. Requested [facility] or [facility] for [facility]. NCM faxed referral for [facility] to [facility] for soc 12/30/2012. Isidoro Donning RN CCM Case Mgmt phone [phone]  12/28/12 1446 Elmer Bales RN, MSN, CM- Met with patient to discuss outpatient PT.  Pt states that he only wants to do PT in [place], but is not interested in going to [facility], which would be the most appropriate for his needs.  CM spoke with PT/OT who states that he is progressing very well at this point and does not need outpatient PT at this time.  Pt and RN updated.

## 2012-12-29 NOTE — Discharge Summary (Signed)
Physician Discharge Summary  Patient ID: Phillip Meyer MRN: [id number] DOB/AGE: 1941/03/13 72 y.o.  Admit date: 12/26/2012 Discharge date: 12/29/2012  Admission Diagnoses: adjacent segment disease   Discharge Diagnoses: same   Discharged Condition: good  Hospital Course: The patient was admitted on 12/26/2012 and taken to the operating room where the patient underwent PLIF. The patient tolerated the procedure well and was taken to the recovery room and then to the floor in stable condition. The hospital course was routine. There were no complications. The wound remained clean dry and intact. Pt had appropriate back soreness. No complaints of leg pain or new N/T/W. The patient remained afebrile with stable vital signs, and tolerated a regular diet. The patient continued to increase activities, and pain was well controlled with oral pain medications.   Consults: None  Significant Diagnostic Studies:  Results for orders placed during the hospital encounter of 12/26/12  APTT      Result Value Range   aPTT 26  24 - 37 seconds  PROTIME-INR      Result Value Range   Prothrombin Time 13.7  11.6 - 15.2 seconds   INR 1.07  0.00 - 1.49  GLUCOSE, CAPILLARY      Result Value Range   Glucose-Capillary 153 (*) 70 - 99 mg/dL  GLUCOSE, CAPILLARY      Result Value Range   Glucose-Capillary 149 (*) 70 - 99 mg/dL   Comment 1 Documented in Chart     Comment 2 Notify RN    PROTIME-INR      Result Value Range   Prothrombin Time 14.1  11.6 - 15.2 seconds   INR 1.11  0.00 - 1.49  GLUCOSE, CAPILLARY      Result Value Range   Glucose-Capillary 182 (*) 70 - 99 mg/dL   Comment 1 Documented in Chart     Comment 2 Notify RN    GLUCOSE, CAPILLARY      Result Value Range   Glucose-Capillary 200 (*) 70 - 99 mg/dL   Comment 1 Documented in Chart     Comment 2 Notify RN    GLUCOSE, CAPILLARY      Result Value Range   Glucose-Capillary 274 (*) 70 - 99 mg/dL  GLUCOSE, CAPILLARY      Result Value  Range   Glucose-Capillary 274 (*) 70 - 99 mg/dL  GLUCOSE, CAPILLARY      Result Value Range   Glucose-Capillary 302 (*) 70 - 99 mg/dL   Comment 1 Notify RN     Comment 2 Documented in Chart    PROTIME-INR      Result Value Range   Prothrombin Time 15.0  11.6 - 15.2 seconds   INR 1.21  0.00 - 1.49  GLUCOSE, CAPILLARY      Result Value Range   Glucose-Capillary 256 (*) 70 - 99 mg/dL  GLUCOSE, CAPILLARY      Result Value Range   Glucose-Capillary 318 (*) 70 - 99 mg/dL  GLUCOSE, CAPILLARY      Result Value Range   Glucose-Capillary 193 (*) 70 - 99 mg/dL  PROTIME-INR      Result Value Range   Prothrombin Time 16.1 (*) 11.6 - 15.2 seconds   INR 1.32  0.00 - 1.49  GLUCOSE, CAPILLARY      Result Value Range   Glucose-Capillary 101 (*) 70 - 99 mg/dL  GLUCOSE, CAPILLARY      Result Value Range   Glucose-Capillary 138 (*) 70 - 99 mg/dL   Comment 1 Notify  RN      Dg Chest 2 View  12/17/2012   *RADIOLOGY REPORT*  Clinical Data: Preadmission respiratory examination for lumbar spine surgery.  CHEST - 2 VIEW  Comparison: Portable examination 11/20/2009.  Findings: The heart size and mediastinal contours are stable. Coronary artery calcifications are noted.  The lungs are clear. There is no pleural effusion or pneumothorax.  Postsurgical changes are noted in the upper lumbar spine.  IMPRESSION: No acute cardiopulmonary process.  Coronary artery disease.   Original Report Authenticated By: Carey Bullocks, M.D.   Dg Lumbar Spine 2-3 Views  12/26/2012   *RADIOLOGY REPORT*  Clinical Data: L5-S1 fusion.  LUMBAR SPINE - 2-3 VIEW,DG C-ARM 61-120 MIN  Fluoroscopic time:  36 seconds.  Comparison: 10/31/2012 MR.  Findings: Two intraoperative C-arm views submitted for review after surgery.  Prior fusion with inferior aspect previously noted at the L5 level has been revised and now incorporates fusion at the L5-S1 level with bilateral pedicle screws in place and interbody spacer.  No complication noted.  This  can be assessed on follow-up. Surgical sponge may be in place on the frontal view.  IMPRESSION: Fusion L5-S1.  Please see above.   Original Report Authenticated By: Lacy Duverney, M.D.   Dg C-arm [phone] Min  12/26/2012   *RADIOLOGY REPORT*  Clinical Data: L5-S1 fusion.  LUMBAR SPINE - 2-3 VIEW,DG C-ARM 61-120 MIN  Fluoroscopic time:  36 seconds.  Comparison: 10/31/2012 MR.  Findings: Two intraoperative C-arm views submitted for review after surgery.  Prior fusion with inferior aspect previously noted at the L5 level has been revised and now incorporates fusion at the L5-S1 level with bilateral pedicle screws in place and interbody spacer.  No complication noted.  This can be assessed on follow-up. Surgical sponge may be in place on the frontal view.  IMPRESSION: Fusion L5-S1.  Please see above.   Original Report Authenticated By: Lacy Duverney, M.D.    Antibiotics:  Anti-infectives   Start     Dose/Rate Route Frequency Ordered Stop   12/28/12 0600  ceFAZolin (ANCEF) IVPB 2 g/50 mL premix     2 g 100 mL/hr over 30 Minutes Intravenous On call to O.R. 12/25/12 1444 12/26/12 1430   12/26/12 2200  ceFAZolin (ANCEF) IVPB 1 g/50 mL premix     1 g 100 mL/hr over 30 Minutes Intravenous Every 8 hours 12/26/12 1827 12/28/12 1350   12/26/12 1521  bacitracin 50,000 Units in sodium chloride irrigation 0.9 % 500 mL irrigation  Status:  Discontinued       As needed 12/26/12 1521 12/26/12 1827   12/26/12 1401  bacitracin 60454 UNITS injection    Comments:  KEY, JENNIFER: cabinet override      12/26/12 1401 12/27/12 0214   12/26/12 1248  ceFAZolin (ANCEF) 2-3 GM-% IVPB SOLR    Comments:  Kathrene Bongo: cabinet override      12/26/12 1248 12/27/12 0059      Discharge Exam: Blood pressure 114/52, pulse 91, temperature 97.7 F (36.5 C), temperature source Oral, resp. rate 17, height 6\' 1"  (1.854 m), weight 134.31 kg (296 lb 1.6 oz), SpO2 96.00%. Neurologic: Grossly normal except old footdrop Incision  CDI  Discharge Medications:     Medication List         aspirin EC 81 MG tablet  Take 81 mg by mouth daily.     cyclobenzaprine 10 MG tablet  Commonly known as:  FLEXERIL  Take 1 tablet (10 mg total) by mouth 3 (three) times daily  as needed for muscle spasms.     diltiazem 120 MG 24 hr capsule  Commonly known as:  CARDIZEM CD  Take 120 mg by mouth daily.     enalapril 20 MG tablet  Commonly known as:  VASOTEC  Take 20 mg by mouth daily.     ferrous sulfate 325 (65 FE) MG tablet  Take 325 mg by mouth daily with breakfast.     Fish Oil 1200 MG Caps  Take 1 capsule by mouth 2 (two) times daily.     furosemide 40 MG tablet  Commonly known as:  LASIX  Take 40 mg by mouth 2 (two) times daily.     gabapentin 600 MG tablet  Commonly known as:  NEURONTIN  Take 600 mg by mouth 2 (two) times daily.     insulin glargine 100 UNIT/ML injection  Commonly known as:  LANTUS  Inject 60 Units into the skin at bedtime.     insulin regular 100 units/mL injection  Commonly known as:  NOVOLIN R,HUMULIN R  Inject 30 Units into the skin 3 (three) times daily before meals.     meclizine 25 MG tablet  Commonly known as:  ANTIVERT  Take 25 mg by mouth at bedtime.     oxyCODONE-acetaminophen 5-325 MG per tablet  Commonly known as:  PERCOCET/ROXICET  Take 1-2 tablets by mouth every 4 (four) hours as needed.     rOPINIRole 2 MG tablet  Commonly known as:  REQUIP  Take 2 mg by mouth 2 (two) times daily.     simvastatin 10 MG tablet  Commonly known as:  ZOCOR  Take 10 mg by mouth at bedtime.     sotalol 80 MG tablet  Commonly known as:  BETAPACE  Take 80 mg by mouth 2 (two) times daily.     warfarin 5 MG tablet  Commonly known as:  COUMADIN  Take 5-7.5 mg by mouth daily. Take 1.5 tablet on Mon, Wed, and Fri. Take 1 tablet all other days        Disposition: home with outpt PT   Final Dx: PLIF      Discharge Orders   Future Orders Complete By Expires     Call MD for:   difficulty breathing, headache or visual disturbances  As directed     Call MD for:  persistant nausea and vomiting  As directed     Call MD for:  redness, tenderness, or signs of infection (pain, swelling, redness, odor or green/yellow discharge around incision site)  As directed     Call MD for:  severe uncontrolled pain  As directed     Call MD for:  temperature >100.4  As directed     Diet - low sodium heart healthy  As directed     Discharge instructions  As directed     Comments:      No strenuous activity, no bending or twisting, no driving, may shower    Increase activity slowly  As directed        Follow-up Information   Follow up with CRAM,GARY P, MD. Schedule an appointment as soon as possible for a visit in 2 weeks.   Contact information:   [address] [place] [address] [phone]        Signed: Tia Alert 12/29/2012, 7:35 AM

## 2012-12-29 NOTE — Progress Notes (Signed)
hemovac was removed by Dr Yetta Barre this morning  Minor, Yvette Rack

## 2012-12-29 NOTE — Progress Notes (Signed)
Per Case Manager, patient is ready to go from her standpoint  Minor, Phillip Meyer

## 2012-12-30 NOTE — Progress Notes (Signed)
   CARE MANAGEMENT NOTE 12/30/2012  Patient:  Phillip Meyer, Phillip Meyer   Account Number:  [id number]  Date Initiated:  12/28/2012  Documentation initiated by:  Elmer Bales  Subjective/Objective Assessment:   Pt admitted for a lumbar laminectomy.     Action/Plan:   Will follow for discharge needs.   Anticipated DC Date:  12/28/2012   Anticipated DC Plan:  HOME/SELF CARE      DC Planning Services  CM consult      Choice offered to / List presented to:  C-1 Patient        HH arranged  HH-2 PT  HH-1 RN      [facility] agency  [facility]   Status of service:  Completed, signed off Medicare Important Message given?   (If response is "NO", the following Medicare IM given date fields will be blank) Date Medicare IM given:   Date Additional Medicare IM given:    Discharge Disposition:    Per UR Regulation:  Reviewed for med. necessity/level of care/duration of stay  If discussed at Long Length of Stay Meetings, dates discussed:    Comments:  12/29/2012 1100 NCM offered choice for [facility]. Pt gave permission to speak to son, [name]. Requested [facility] or [facility] for [facility] NCM faxed referral to [facility] for [facility] to [facility] for soc 12/30/2012. Isidoro Donning RN CCM Case Mgmt phone [phone]  12/28/12 1446 Elmer Bales RN, MSN, CM- Met with patient to discuss outpatient PT.  Pt states that he only wants to do PT in [place], but is not interested in going to [facility], which would be the most appropriate for his needs.  CM spoke with PT/OT who states that he is progressing very well at this point and does not need outpatient PT at this time.  Pt and RN updated.

## 2013-03-05 ENCOUNTER — Other Ambulatory Visit: Payer: Self-pay | Admitting: Neurosurgery

## 2013-03-05 ENCOUNTER — Other Ambulatory Visit: Payer: Medicare Other

## 2013-03-05 ENCOUNTER — Ambulatory Visit
Admission: RE | Admit: 2013-03-05 | Discharge: 2013-03-05 | Disposition: A | Discharge status: Home / Self Care | Payer: Medicare Other | Source: Ambulatory Visit | Attending: Neurosurgery | Admitting: Neurosurgery

## 2013-03-05 DIAGNOSIS — M4800 Spinal stenosis, site unspecified: Secondary | ICD-10-CM

## 2013-03-07 ENCOUNTER — Other Ambulatory Visit: Payer: Self-pay | Admitting: Neurosurgery

## 2013-03-07 DIAGNOSIS — IMO0002 Reserved for concepts with insufficient information to code with codable children: Secondary | ICD-10-CM

## 2013-03-07 DIAGNOSIS — M545 Low back pain: Secondary | ICD-10-CM

## 2013-03-08 ENCOUNTER — Other Ambulatory Visit: Payer: Self-pay | Admitting: Neurosurgery

## 2013-03-12 ENCOUNTER — Ambulatory Visit
Admission: RE | Admit: 2013-03-12 | Discharge: 2013-03-12 | Disposition: A | Discharge status: Home / Self Care | Payer: Medicare Other | Source: Ambulatory Visit | Attending: Neurosurgery | Admitting: Neurosurgery

## 2013-03-12 DIAGNOSIS — IMO0002 Reserved for concepts with insufficient information to code with codable children: Secondary | ICD-10-CM

## 2013-03-12 DIAGNOSIS — M545 Low back pain, unspecified: Secondary | ICD-10-CM

## 2013-03-12 MED ORDER — GADOBENATE DIMEGLUMINE 529 MG/ML IV SOLN
20.0000 mL | Freq: Once | INTRAVENOUS | Status: AC | PRN
  Administered 2013-03-12: 20 mL via INTRAVENOUS

## 2013-03-14 ENCOUNTER — Encounter (HOSPITAL_COMMUNITY): Payer: Self-pay | Admitting: Pharmacy Technician

## 2013-03-14 ENCOUNTER — Encounter (HOSPITAL_COMMUNITY): Payer: Self-pay

## 2013-03-14 ENCOUNTER — Other Ambulatory Visit: Payer: Medicare Other

## 2013-03-14 ENCOUNTER — Encounter (HOSPITAL_COMMUNITY)
Admission: RE | Admit: 2013-03-14 | Discharge: 2013-03-14 | Disposition: A | Discharge status: Home / Self Care | Payer: Medicare Other | Source: Ambulatory Visit | Attending: Neurosurgery | Admitting: Neurosurgery

## 2013-03-14 DIAGNOSIS — Z01818 Encounter for other preprocedural examination: Secondary | ICD-10-CM | POA: Insufficient documentation

## 2013-03-14 DIAGNOSIS — Z01812 Encounter for preprocedural laboratory examination: Secondary | ICD-10-CM | POA: Insufficient documentation

## 2013-03-14 HISTORY — DX: Personal history of other specified conditions: Z87.898

## 2013-03-14 HISTORY — DX: Foot drop, left foot: M21.372

## 2013-03-14 HISTORY — DX: Hyperlipidemia, unspecified: E78.5

## 2013-03-14 LAB — BASIC METABOLIC PANEL
BUN: 12 mg/dL (ref 6–23)
Chloride: 103 mEq/L (ref 96–112)
Creatinine, Ser: 0.68 mg/dL (ref 0.50–1.35)
GFR calc Af Amer: >90 mL/min (ref >90)
Glucose, Bld: 112 mg/dL — ABNORMAL HIGH (ref 70–99)
Potassium: 3.9 mEq/L (ref 3.5–5.1)

## 2013-03-14 LAB — PROTIME-INR
INR: 1.78 — ABNORMAL HIGH (ref 0.00–1.49)
Prothrombin Time: 20.2 s — ABNORMAL HIGH (ref 11.6–15.2)

## 2013-03-14 LAB — SURGICAL PCR SCREEN
MRSA, PCR: NEGATIVE
Staphylococcus aureus: NEGATIVE

## 2013-03-14 LAB — CBC
HCT: 41.8 % (ref 39.0–52.0)
Hemoglobin: 14.3 g/dL (ref 13.0–17.0)
MCH: 31.6 pg (ref 26.0–34.0)
MCHC: 34.2 g/dL (ref 30.0–36.0)
MCV: 92.5 fL (ref 78.0–100.0)
RBC: 4.52 MIL/uL (ref 4.22–5.81)
RDW: 14.2 % (ref 11.5–15.5)

## 2013-03-14 LAB — TYPE AND SCREEN: Antibody Screen: NEGATIVE

## 2013-03-14 NOTE — Progress Notes (Signed)
Pt chart left for [place], [place], (anesthesia) to review pt abnormal EKG, cardiac history, and labs (PT INR).

## 2013-03-14 NOTE — Pre-Procedure Instructions (Signed)
[facility]  03/14/2013   Your procedure is scheduled on:  Wednesday March 27, 2013.  Report to [facility] Short Stay Entrance "A" at 6:30 AM.  Call this number if you have problems the morning of surgery: [phone]   Remember:   Do not eat food or drink liquids after midnight.   Take these medicines the morning of surgery with A SIP OF WATER: Diltiazem (Cardizem), Gabapentin (Neurontin), Meclizine (Antivert), Sotalol (Betapace), Tramadol (Ultram) if needed for pain  Discontinue aspirin, Coumadin, and herbal medications 7 days prior to surgery (ex. Fish Oil)   Do NOT take any diabetic medications including insulins the morning of your surgery  Do not wear jewelry.  Do not wear lotions, powders, or colognes. You may wear deodorant.  Men may shave face and neck.  Do not bring valuables to the hospital.  [facility] is not responsible for any belongings or valuables.               Contacts, dentures or bridgework may not be worn into surgery.  Leave suitcase in the car. After surgery it may be brought to your room.  For patients admitted to the hospital, discharge time is determined by your treatment team.               Patients discharged the day of surgery will not be allowed to drive home.  Name and phone number of your driver: Family/Friend  Special Instructions: Shower using CHG 2 nights before surgery and the night before surgery.  If you shower the day of surgery use CHG.  Use special wash - you have one bottle of CHG for all showers.  You should use approximately 1/3 of the bottle for each shower.   Please read over the following fact sheets that you were given: Pain Booklet, Coughing and Deep Breathing, Blood Transfusion Information, MRSA Information and Surgical Site Infection Prevention

## 2013-03-14 NOTE — Progress Notes (Signed)
Patient informed Nurse that he had a stress test and cardiac cath at [facility] Will request records. PCP is Dr. Renae Fickle in [place] and LOV was yesterday 03/13/13 will also request LOV. Patient was positive for sleep apnea screening, but patient stated he had a sleep study and it was negative.

## 2013-03-14 NOTE — Progress Notes (Signed)
Nurse called Phillip Meyer at Dr. Lonie Peak office and informed her that orders were under "signed and held." Phillip Meyer stated Dr. Wynetta Emery would be out of the office until Monday October 1st, but she would send him a "reminder" to sign the orders.

## 2013-03-15 NOTE — Progress Notes (Signed)
Anesthesia Chart Review: Patient is a 72 year old male posted for L5-S1 posterior lateral fusion on 03/27/13 by Dr. Wynetta Emery.  He is s/p L5-S1 laminectomy,PLIF on 7/11/4.  Other history includes obesity, DM2, HTN, HLD, afib s/p cardioversion 05/2009, CHF/non-ischemic cardiomyopathy '98, non-smoker, prior back surgery, left foot drop, history of snoring. PCP is Dr. Renae Fickle in [place].  EKG on 12/17/12 showed afib @ 66 bpm, LAD, inferior infarct (age undetermined).   Cardiologist is Dr. Shelbie Ammons at [facility].  Patient was noted to have recurrent afib in July 2014. An anesthesiologist spoke with Dr. Roxan Hockey prior to patient's surgery then, and no additional cardiology testing was recommended if there were no changes in patient's symptomology.  HR was documented at 82 bpm at PAT.  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 He has had a few follow-up echocardiograms since, most recently on 11/22/12. Results showed: normal LV size and thickness, mildly reduced LV systolic function, EF 45-50%, mild global hypokinesis of the LV. LA moderately dilated. Mild MR/TR. Mild pulmonary HTN. No pericardial effusion.   His last stress test was in 2007.  CXR report on 12/17/12 showed: The heart size and mediastinal contours are stable. Coronary artery calcifications are noted. The lungs are clear. There is no pleural effusion or pneumothorax. Postsurgical changes are noted in the upper lumbar spine.   Preoperative labs noted. He will need PT/INR repeated on the day of surgery. If follow-up INR acceptable and no acute CV/CHF symptoms then I would anticipate that patient could proceed as planned.  Velna [facility]/Anesthesiology Phone [phone] 03/15/2013 11:18 AM

## 2013-03-21 NOTE — Progress Notes (Signed)
Spoke with Erie Noe....she will address order issue with Dr. Wynetta Emery.......DA

## 2013-03-27 ENCOUNTER — Inpatient Hospital Stay (HOSPITAL_COMMUNITY): Payer: Medicare Other

## 2013-03-27 ENCOUNTER — Encounter (HOSPITAL_COMMUNITY): Payer: Self-pay | Admitting: *Deleted

## 2013-03-27 ENCOUNTER — Encounter (HOSPITAL_COMMUNITY)
Admission: RE | Disposition: A | Discharge status: Home / Self Care | Payer: Self-pay | Source: Ambulatory Visit | Attending: Neurosurgery

## 2013-03-27 ENCOUNTER — Inpatient Hospital Stay (HOSPITAL_COMMUNITY): Payer: Medicare Other | Admitting: Anesthesiology

## 2013-03-27 ENCOUNTER — Encounter (HOSPITAL_COMMUNITY): Payer: Medicare Other | Admitting: Vascular Surgery

## 2013-03-27 ENCOUNTER — Inpatient Hospital Stay (HOSPITAL_COMMUNITY)
Admission: RE | Admit: 2013-03-27 | Discharge: 2013-03-30 | DRG: 460 | Disposition: A | Discharge status: Home / Self Care | Payer: Medicare Other | Source: Ambulatory Visit | Attending: Neurosurgery | Admitting: Neurosurgery

## 2013-03-27 DIAGNOSIS — M129 Arthropathy, unspecified: Secondary | ICD-10-CM | POA: Diagnosis present

## 2013-03-27 DIAGNOSIS — Y831 Surgical operation with implant of artificial internal device as the cause of abnormal reaction of the patient, or of later complication, without mention of misadventure at the time of the procedure: Secondary | ICD-10-CM | POA: Diagnosis present

## 2013-03-27 DIAGNOSIS — Z7901 Long term (current) use of anticoagulants: Secondary | ICD-10-CM

## 2013-03-27 DIAGNOSIS — M79609 Pain in unspecified limb: Secondary | ICD-10-CM | POA: Diagnosis present

## 2013-03-27 DIAGNOSIS — I509 Heart failure, unspecified: Secondary | ICD-10-CM | POA: Diagnosis present

## 2013-03-27 DIAGNOSIS — T84498A Other mechanical complication of other internal orthopedic devices, implants and grafts, initial encounter: Principal | ICD-10-CM | POA: Diagnosis present

## 2013-03-27 DIAGNOSIS — Z79899 Other long term (current) drug therapy: Secondary | ICD-10-CM

## 2013-03-27 DIAGNOSIS — E119 Type 2 diabetes mellitus without complications: Secondary | ICD-10-CM | POA: Diagnosis present

## 2013-03-27 DIAGNOSIS — Z7982 Long term (current) use of aspirin: Secondary | ICD-10-CM

## 2013-03-27 DIAGNOSIS — I4891 Unspecified atrial fibrillation: Secondary | ICD-10-CM | POA: Diagnosis present

## 2013-03-27 DIAGNOSIS — Z794 Long term (current) use of insulin: Secondary | ICD-10-CM

## 2013-03-27 DIAGNOSIS — I10 Essential (primary) hypertension: Secondary | ICD-10-CM | POA: Diagnosis present

## 2013-03-27 DIAGNOSIS — E785 Hyperlipidemia, unspecified: Secondary | ICD-10-CM | POA: Diagnosis present

## 2013-03-27 DIAGNOSIS — M216X9 Other acquired deformities of unspecified foot: Secondary | ICD-10-CM | POA: Diagnosis present

## 2013-03-27 HISTORY — PX: LAMINECTOMY WITH POSTERIOR LATERAL ARTHRODESIS LEVEL 1: SHX6335

## 2013-03-27 LAB — GLUCOSE, CAPILLARY
Glucose-Capillary: 82 mg/dL (ref 70–99)
Glucose-Capillary: 91 mg/dL (ref 70–99)
Glucose-Capillary: 94 mg/dL (ref 70–99)
Glucose-Capillary: 82 mg/dL (ref 70–99)

## 2013-03-27 LAB — GRAM STAIN

## 2013-03-27 LAB — PROTIME-INR
INR: 1.02 (ref 0.00–1.49)
Prothrombin Time: 13.2 seconds (ref 11.6–15.2)

## 2013-03-27 SURGERY — LAMINECTOMY WITH POSTERIOR LATERAL ARTHRODESIS LEVEL 1
Anesthesia: General | Site: Spine Lumbar | Wound class: Clean

## 2013-03-27 MED ORDER — SODIUM CHLORIDE 0.9 % IJ SOLN: 3.0000 mL | INTRAMUSCULAR | Status: DC | PRN

## 2013-03-27 MED ORDER — CEFAZOLIN SODIUM-DEXTROSE 2-3 GM-% IV SOLR
INTRAVENOUS | Status: AC
  Administered 2013-03-27: 3 g via INTRAVENOUS
  Administered 2013-03-27: 2 g via INTRAVENOUS
  Administered 2013-03-27: 1 g via INTRAVENOUS
  Filled 2013-03-27: qty 50

## 2013-03-27 MED ORDER — ACETAMINOPHEN 325 MG PO TABS: 650.0000 mg | ORAL_TABLET | ORAL | Status: DC | PRN

## 2013-03-27 MED ORDER — MENTHOL 3 MG MT LOZG: 1.0000 | LOZENGE | OROMUCOSAL | Status: DC | PRN

## 2013-03-27 MED ORDER — ROCURONIUM BROMIDE 100 MG/10ML IV SOLN
INTRAVENOUS | Status: DC | PRN
  Administered 2013-03-27: 10 mg via INTRAVENOUS
  Administered 2013-03-27: 50 mg via INTRAVENOUS

## 2013-03-27 MED ORDER — ONDANSETRON HCL 4 MG/2ML IJ SOLN: 4.0000 mg | Freq: Once | INTRAMUSCULAR | Status: DC | PRN

## 2013-03-27 MED ORDER — WARFARIN - PHYSICIAN DOSING INPATIENT: Freq: Every day | Status: DC

## 2013-03-27 MED ORDER — HEMOSTATIC AGENTS (NO CHARGE) OPTIME
TOPICAL | Status: DC | PRN
  Administered 2013-03-27: 1 via TOPICAL

## 2013-03-27 MED ORDER — LACTATED RINGERS IV SOLN
INTRAVENOUS | Status: DC | PRN
  Administered 2013-03-27 (×4): via INTRAVENOUS

## 2013-03-27 MED ORDER — PHENOL 1.4 % MT LIQD: 1.0000 | OROMUCOSAL | Status: DC | PRN

## 2013-03-27 MED ORDER — ACETAMINOPHEN 650 MG RE SUPP: 650.0000 mg | RECTAL | Status: DC | PRN

## 2013-03-27 MED ORDER — POTASSIUM CHLORIDE IN NACL 20-0.45 MEQ/L-% IV SOLN
INTRAVENOUS | Status: DC
  Administered 2013-03-27: 18:00:00 via INTRAVENOUS
  Filled 2013-03-27 (×6): qty 1000

## 2013-03-27 MED ORDER — WARFARIN SODIUM 5 MG PO TABS
5.0000 mg | ORAL_TABLET | ORAL | Status: DC
  Administered 2013-03-27 – 2013-03-29 (×2): 5 mg via ORAL
  Filled 2013-03-27 (×2): qty 1

## 2013-03-27 MED ORDER — ENALAPRIL MALEATE 20 MG PO TABS
20.0000 mg | ORAL_TABLET | Freq: Two times a day (BID) | ORAL | Status: DC
  Administered 2013-03-27 – 2013-03-30 (×3): 20 mg via ORAL
  Filled 2013-03-27 (×8): qty 1

## 2013-03-27 MED ORDER — CEFAZOLIN SODIUM 1-5 GM-% IV SOLN
INTRAVENOUS | Status: AC
  Filled 2013-03-27: qty 50

## 2013-03-27 MED ORDER — MECLIZINE HCL 25 MG PO TABS
25.0000 mg | ORAL_TABLET | Freq: Three times a day (TID) | ORAL | Status: DC
  Administered 2013-03-27 – 2013-03-30 (×8): 25 mg via ORAL
  Filled 2013-03-27 (×11): qty 1

## 2013-03-27 MED ORDER — FUROSEMIDE 40 MG PO TABS
40.0000 mg | ORAL_TABLET | Freq: Two times a day (BID) | ORAL | Status: DC
  Administered 2013-03-27 – 2013-03-30 (×6): 40 mg via ORAL
  Filled 2013-03-27 (×8): qty 1

## 2013-03-27 MED ORDER — LIDOCAINE HCL (CARDIAC) 20 MG/ML IV SOLN
INTRAVENOUS | Status: DC | PRN
  Administered 2013-03-27: 100 mg via INTRAVENOUS

## 2013-03-27 MED ORDER — PHENYLEPHRINE HCL 10 MG/ML IJ SOLN
INTRAMUSCULAR | Status: DC | PRN
  Administered 2013-03-27 (×3): 40 ug via INTRAVENOUS

## 2013-03-27 MED ORDER — DEXTROSE 50 % IV SOLN
INTRAVENOUS | Status: DC | PRN
  Administered 2013-03-27: 25 mL via INTRAVENOUS

## 2013-03-27 MED ORDER — HYDROMORPHONE HCL PF 1 MG/ML IJ SOLN: 0.5000 mg | INTRAMUSCULAR | Status: DC | PRN

## 2013-03-27 MED ORDER — SOTALOL HCL 80 MG PO TABS
80.0000 mg | ORAL_TABLET | Freq: Two times a day (BID) | ORAL | Status: DC
  Administered 2013-03-28 – 2013-03-30 (×5): 80 mg via ORAL
  Filled 2013-03-27 (×7): qty 1

## 2013-03-27 MED ORDER — OXYCODONE-ACETAMINOPHEN 5-325 MG PO TABS
1.0000 | ORAL_TABLET | ORAL | Status: DC | PRN
  Administered 2013-03-27 – 2013-03-29 (×6): 2 via ORAL
  Administered 2013-03-30: 1 via ORAL
  Filled 2013-03-27: qty 1
  Filled 2013-03-27 (×6): qty 2

## 2013-03-27 MED ORDER — ONDANSETRON HCL 4 MG/2ML IJ SOLN
INTRAMUSCULAR | Status: DC | PRN
  Administered 2013-03-27: 4 mg via INTRAMUSCULAR

## 2013-03-27 MED ORDER — GABAPENTIN 600 MG PO TABS
600.0000 mg | ORAL_TABLET | Freq: Three times a day (TID) | ORAL | Status: DC
  Administered 2013-03-27 – 2013-03-30 (×9): 600 mg via ORAL
  Filled 2013-03-27 (×11): qty 1

## 2013-03-27 MED ORDER — THROMBIN 20000 UNITS EX SOLR
CUTANEOUS | Status: DC | PRN
  Administered 2013-03-27: 09:00:00 via TOPICAL

## 2013-03-27 MED ORDER — MEPERIDINE HCL 25 MG/ML IJ SOLN: 6.2500 mg | INTRAMUSCULAR | Status: DC | PRN

## 2013-03-27 MED ORDER — PROPOFOL 10 MG/ML IV BOLUS
INTRAVENOUS | Status: DC | PRN
  Administered 2013-03-27: 130 mg via INTRAVENOUS

## 2013-03-27 MED ORDER — ALUM & MAG HYDROXIDE-SIMETH 200-200-20 MG/5ML PO SUSP: 30.0000 mL | Freq: Four times a day (QID) | ORAL | Status: DC | PRN

## 2013-03-27 MED ORDER — SODIUM CHLORIDE 0.9 % IV SOLN: 250.0000 mL | INTRAVENOUS | Status: DC

## 2013-03-27 MED ORDER — DOCUSATE SODIUM 100 MG PO CAPS
100.0000 mg | ORAL_CAPSULE | Freq: Two times a day (BID) | ORAL | Status: DC
  Administered 2013-03-28 – 2013-03-30 (×5): 100 mg via ORAL
  Filled 2013-03-27 (×5): qty 1

## 2013-03-27 MED ORDER — PHENYLEPHRINE HCL 10 MG/ML IJ SOLN
10.0000 mg | INTRAVENOUS | Status: DC | PRN
  Administered 2013-03-27: 25 ug/min via INTRAVENOUS

## 2013-03-27 MED ORDER — GLYCOPYRROLATE 0.2 MG/ML IJ SOLN
INTRAMUSCULAR | Status: DC | PRN
  Administered 2013-03-27: .8 mg via INTRAVENOUS

## 2013-03-27 MED ORDER — CEFAZOLIN SODIUM-DEXTROSE 2-3 GM-% IV SOLR
INTRAVENOUS | Status: AC
  Filled 2013-03-27: qty 50

## 2013-03-27 MED ORDER — SIMVASTATIN 10 MG PO TABS
10.0000 mg | ORAL_TABLET | Freq: Every day | ORAL | Status: DC
  Administered 2013-03-27 – 2013-03-29 (×3): 10 mg via ORAL
  Filled 2013-03-27 (×5): qty 1

## 2013-03-27 MED ORDER — DEXTROSE 50 % IV SOLN
25.0000 mL | Freq: Once | INTRAVENOUS | Status: AC
  Administered 2013-03-27: 15:00:00 via INTRAVENOUS

## 2013-03-27 MED ORDER — OXYCODONE HCL 5 MG/5ML PO SOLN: 5.0000 mg | Freq: Once | ORAL | Status: DC | PRN

## 2013-03-27 MED ORDER — EPHEDRINE SULFATE 50 MG/ML IJ SOLN
INTRAMUSCULAR | Status: DC | PRN
  Administered 2013-03-27 (×4): 5 mg via INTRAVENOUS

## 2013-03-27 MED ORDER — DEXTROSE 50 % IV SOLN
INTRAVENOUS | Status: AC
  Filled 2013-03-27: qty 50

## 2013-03-27 MED ORDER — SODIUM CHLORIDE 0.9 % IR SOLN
Status: DC | PRN
  Administered 2013-03-27 (×2)

## 2013-03-27 MED ORDER — LIDOCAINE-EPINEPHRINE 1 %-1:100000 IJ SOLN
INTRAMUSCULAR | Status: DC | PRN
  Administered 2013-03-27: 10 mL

## 2013-03-27 MED ORDER — FENTANYL CITRATE 0.05 MG/ML IJ SOLN
INTRAMUSCULAR | Status: DC | PRN
  Administered 2013-03-27 (×6): 50 ug via INTRAVENOUS

## 2013-03-27 MED ORDER — TRAMADOL HCL 50 MG PO TABS: 50.0000 mg | ORAL_TABLET | Freq: Four times a day (QID) | ORAL | Status: DC | PRN

## 2013-03-27 MED ORDER — SODIUM CHLORIDE 0.9 % IJ SOLN
3.0000 mL | Freq: Two times a day (BID) | INTRAMUSCULAR | Status: DC
  Administered 2013-03-28 – 2013-03-29 (×4): 3 mL via INTRAVENOUS

## 2013-03-27 MED ORDER — CYCLOBENZAPRINE HCL 10 MG PO TABS: 10.0000 mg | ORAL_TABLET | Freq: Three times a day (TID) | ORAL | Status: DC | PRN

## 2013-03-27 MED ORDER — OXYCODONE HCL 5 MG PO TABS
5.0000 mg | ORAL_TABLET | Freq: Once | ORAL | Status: DC | PRN
Start: 2013-03-27 — End: 2013-03-27

## 2013-03-27 MED ORDER — 0.9 % SODIUM CHLORIDE (POUR BTL) OPTIME
TOPICAL | Status: DC | PRN
  Administered 2013-03-27: 1000 mL

## 2013-03-27 MED ORDER — DILTIAZEM HCL ER COATED BEADS 120 MG PO CP24
120.0000 mg | ORAL_CAPSULE | Freq: Every day | ORAL | Status: DC
  Administered 2013-03-28 – 2013-03-30 (×3): 120 mg via ORAL
  Filled 2013-03-27 (×3): qty 1

## 2013-03-27 MED ORDER — CEFAZOLIN SODIUM 1-5 GM-% IV SOLN
1.0000 g | Freq: Three times a day (TID) | INTRAVENOUS | Status: AC
  Administered 2013-03-27 – 2013-03-28 (×2): 1 g via INTRAVENOUS
  Filled 2013-03-27 (×2): qty 50

## 2013-03-27 MED ORDER — HYDROMORPHONE HCL PF 1 MG/ML IJ SOLN: 0.2500 mg | INTRAMUSCULAR | Status: DC | PRN

## 2013-03-27 MED ORDER — PHENYLEPHRINE HCL 10 MG/ML IJ SOLN: INTRAMUSCULAR | Status: DC | PRN

## 2013-03-27 MED ORDER — ONDANSETRON HCL 4 MG/2ML IJ SOLN: 4.0000 mg | INTRAMUSCULAR | Status: DC | PRN

## 2013-03-27 MED ORDER — ALBUMIN HUMAN 5 % IV SOLN
INTRAVENOUS | Status: DC | PRN
  Administered 2013-03-27: 11:00:00 via INTRAVENOUS

## 2013-03-27 MED ORDER — FERROUS SULFATE 325 (65 FE) MG PO TABS
325.0000 mg | ORAL_TABLET | Freq: Every day | ORAL | Status: DC
  Administered 2013-03-28 – 2013-03-30 (×3): 325 mg via ORAL
  Filled 2013-03-27 (×4): qty 1

## 2013-03-27 MED ORDER — NEOSTIGMINE METHYLSULFATE 1 MG/ML IJ SOLN
INTRAMUSCULAR | Status: DC | PRN
  Administered 2013-03-27: 5 mg via INTRAVENOUS

## 2013-03-27 MED ORDER — WARFARIN SODIUM 7.5 MG PO TABS
7.5000 mg | ORAL_TABLET | ORAL | Status: AC
  Administered 2013-03-28: 7.5 mg via ORAL
  Filled 2013-03-27: qty 1

## 2013-03-27 MED ORDER — MIDAZOLAM HCL 5 MG/5ML IJ SOLN
INTRAMUSCULAR | Status: DC | PRN
  Administered 2013-03-27 (×2): 1 mg via INTRAVENOUS

## 2013-03-27 MED ORDER — ROPINIROLE HCL 1 MG PO TABS
2.0000 mg | ORAL_TABLET | Freq: Two times a day (BID) | ORAL | Status: DC
  Administered 2013-03-27 – 2013-03-30 (×6): 2 mg via ORAL
  Filled 2013-03-27 (×7): qty 2

## 2013-03-27 MED ORDER — ASPIRIN EC 81 MG PO TBEC
81.0000 mg | DELAYED_RELEASE_TABLET | Freq: Every day | ORAL | Status: DC
  Administered 2013-03-27 – 2013-03-30 (×4): 81 mg via ORAL
  Filled 2013-03-27 (×4): qty 1

## 2013-03-27 MED ORDER — ARTIFICIAL TEARS OP OINT
TOPICAL_OINTMENT | OPHTHALMIC | Status: DC | PRN
  Administered 2013-03-27: 1 via OPHTHALMIC

## 2013-03-27 MED ORDER — INSULIN GLARGINE 100 UNIT/ML ~~LOC~~ SOLN
60.0000 [IU] | Freq: Every day | SUBCUTANEOUS | Status: DC
  Administered 2013-03-28 – 2013-03-29 (×2): 60 [IU] via SUBCUTANEOUS
  Filled 2013-03-27 (×4): qty 0.6

## 2013-03-27 SURGICAL SUPPLY — 82 items
ADH SKN CLS APL DERMABOND .7 (GAUZE/BANDAGES/DRESSINGS) ×2
ADH SKN CLS LQ APL DERMABOND (GAUZE/BANDAGES/DRESSINGS) ×2
APL SKNCLS STERI-STRIP NONHPOA (GAUZE/BANDAGES/DRESSINGS) ×2
BAG DECANTER FOR FLEXI CONT (MISCELLANEOUS) ×4 IMPLANT
BANDAGE GAUZE ELAST BULKY 4 IN (GAUZE/BANDAGES/DRESSINGS) ×1 IMPLANT
BENZOIN TINCTURE PRP APPL 2/3 (GAUZE/BANDAGES/DRESSINGS) ×4 IMPLANT
BLADE SURG 11 STRL SS (BLADE) ×2 IMPLANT
BLADE SURG ROTATE 9660 (MISCELLANEOUS) ×1 IMPLANT
BRUSH SCRUB EZ PLAIN DRY (MISCELLANEOUS) ×2 IMPLANT
BUR MATCHSTICK NEURO 3.0 LAGG (BURR) ×2 IMPLANT
BUR PRECISION FLUTE 6.0 (BURR) ×2 IMPLANT
CANISTER SUCTION 2500CC (MISCELLANEOUS) ×2 IMPLANT
CLOTH BEACON ORANGE TIMEOUT ST (SAFETY) IMPLANT
CONT SPEC 4OZ CLIKSEAL STRL BL (MISCELLANEOUS) ×4 IMPLANT
COVER BACK TABLE 24X17X13 BIG (DRAPES) IMPLANT
COVER TABLE BACK 60X90 (DRAPES) ×2 IMPLANT
DECANTER SPIKE VIAL GLASS SM (MISCELLANEOUS) ×2 IMPLANT
DERMABOND ADHESIVE PROPEN (GAUZE/BANDAGES/DRESSINGS) ×2
DERMABOND ADVANCED (GAUZE/BANDAGES/DRESSINGS) ×2
DERMABOND ADVANCED .7 DNX12 (GAUZE/BANDAGES/DRESSINGS) ×1 IMPLANT
DERMABOND ADVANCED .7 DNX6 (GAUZE/BANDAGES/DRESSINGS) IMPLANT
DRAPE C-ARM 42X72 X-RAY (DRAPES) ×5 IMPLANT
DRAPE C-ARMOR (DRAPES) ×1 IMPLANT
DRAPE LAPAROTOMY 100X72X124 (DRAPES) ×2 IMPLANT
DRAPE POUCH INSTRU U-SHP 10X18 (DRAPES) ×2 IMPLANT
DRAPE PROXIMA HALF (DRAPES) IMPLANT
DRAPE SURG 17X23 STRL (DRAPES) ×2 IMPLANT
DRSG OPSITE 4X5.5 SM (GAUZE/BANDAGES/DRESSINGS) ×2 IMPLANT
DRSG OPSITE POSTOP 4X10 (GAUZE/BANDAGES/DRESSINGS) ×1 IMPLANT
DRSG OPSITE POSTOP 4X6 (GAUZE/BANDAGES/DRESSINGS) ×1 IMPLANT
DURAPREP 26ML APPLICATOR (WOUND CARE) ×2 IMPLANT
ELECT BLADE 4.0 EZ CLEAN MEGAD (MISCELLANEOUS) ×2
ELECT REM PT RETURN 9FT ADLT (ELECTROSURGICAL) ×2
ELECTRODE BLDE 4.0 EZ CLN MEGD (MISCELLANEOUS) IMPLANT
ELECTRODE REM PT RTRN 9FT ADLT (ELECTROSURGICAL) ×1 IMPLANT
EVACUATOR 3/16  PVC DRAIN (DRAIN) ×2
EVACUATOR 3/16 PVC DRAIN (DRAIN) ×1 IMPLANT
GAUZE SPONGE 4X4 16PLY XRAY LF (GAUZE/BANDAGES/DRESSINGS) ×1 IMPLANT
GLOVE BIO SURGEON STRL SZ8 (GLOVE) ×10 IMPLANT
GLOVE BIOGEL M 8.0 STRL (GLOVE) ×2 IMPLANT
GLOVE BIOGEL PI IND STRL 8 (GLOVE) IMPLANT
GLOVE BIOGEL PI INDICATOR 8 (GLOVE) ×2
GLOVE ECLIPSE 7.0 STRL STRAW (GLOVE) ×1 IMPLANT
GLOVE ECLIPSE 7.5 STRL STRAW (GLOVE) ×6 IMPLANT
GLOVE EXAM NITRILE LRG STRL (GLOVE) ×2 IMPLANT
GLOVE EXAM NITRILE MD LF STRL (GLOVE) IMPLANT
GLOVE EXAM NITRILE XL STR (GLOVE) IMPLANT
GLOVE EXAM NITRILE XS STR PU (GLOVE) IMPLANT
GLOVE INDICATOR 7.5 STRL GRN (GLOVE) ×1 IMPLANT
GLOVE INDICATOR 8.5 STRL (GLOVE) ×4 IMPLANT
GOWN BRE IMP SLV AUR LG STRL (GOWN DISPOSABLE) IMPLANT
GOWN BRE IMP SLV AUR XL STRL (GOWN DISPOSABLE) ×5 IMPLANT
GOWN STRL REIN 2XL LVL4 (GOWN DISPOSABLE) ×1 IMPLANT
KIT BASIN OR (CUSTOM PROCEDURE TRAY) ×2 IMPLANT
KIT INFUSE X SMALL 1.4CC (Orthopedic Implant) ×2 IMPLANT
KIT ROOM TURNOVER OR (KITS) ×2 IMPLANT
NDL HYPO 25X1 1.5 SAFETY (NEEDLE) ×1 IMPLANT
NEEDLE HYPO 25X1 1.5 SAFETY (NEEDLE) ×2 IMPLANT
NS IRRIG 1000ML POUR BTL (IV SOLUTION) ×2 IMPLANT
PACK LAMINECTOMY NEURO (CUSTOM PROCEDURE TRAY) ×2 IMPLANT
PAD ARMBOARD 7.5X6 YLW CONV (MISCELLANEOUS) ×10 IMPLANT
PUTTY BONE DBX 5CC MIX (Putty) ×1 IMPLANT
ROD PREBENT 6.35X80 (Rod) ×1 IMPLANT
SCREW 8.5X55MM (Screw) ×2 IMPLANT
SCREW DANEK NONBREAK (Screw) ×1 IMPLANT
SCREW LEGACY 8.5X60 (Screw) ×2 IMPLANT
SCREW SET NON BREAK OFF (Screw) ×4 IMPLANT
SPONGE GAUZE 4X4 12PLY (GAUZE/BANDAGES/DRESSINGS) ×2 IMPLANT
SPONGE LAP 4X18 X RAY DECT (DISPOSABLE) IMPLANT
SPONGE SURGIFOAM ABS GEL 100 (HEMOSTASIS) ×3 IMPLANT
STRIP CLOSURE SKIN 1/2X4 (GAUZE/BANDAGES/DRESSINGS) ×4 IMPLANT
SUT VIC AB 0 CT1 18XCR BRD8 (SUTURE) ×2 IMPLANT
SUT VIC AB 0 CT1 8-18 (SUTURE) ×6
SUT VIC AB 2-0 CT1 18 (SUTURE) ×4 IMPLANT
SUT VICRYL 4-0 PS2 18IN ABS (SUTURE) ×3 IMPLANT
SWAB COLLECTION DEVICE MRSA (MISCELLANEOUS) ×4 IMPLANT
SYR 20ML ECCENTRIC (SYRINGE) ×2 IMPLANT
TOWEL OR 17X24 6PK STRL BLUE (TOWEL DISPOSABLE) ×2 IMPLANT
TOWEL OR 17X26 10 PK STRL BLUE (TOWEL DISPOSABLE) ×2 IMPLANT
TRAY FOLEY CATH 14FRSI W/METER (CATHETERS) ×2 IMPLANT
TUBE ANAEROBIC SPECIMEN COL (MISCELLANEOUS) ×2 IMPLANT
WATER STERILE IRR 1000ML POUR (IV SOLUTION) ×2 IMPLANT

## 2013-03-27 NOTE — Anesthesia Preprocedure Evaluation (Signed)
Anesthesia Evaluation  Patient identified by MRN, date of birth, ID band Patient awake    Reviewed: Allergy & Precautions, H&P , NPO status , Patient's Chart, lab work & pertinent test results  Airway Mallampati: I TM Distance: >3 FB Neck ROM: Full    Dental   Pulmonary          Cardiovascular hypertension, Pt. on medications +CHF + dysrhythmias Atrial Fibrillation     Neuro/Psych    GI/Hepatic   Endo/Other  diabetes, Well Controlled, Type 2, Insulin Dependent  Renal/GU      Musculoskeletal   Abdominal   Peds  Hematology   Anesthesia Other Findings   Reproductive/Obstetrics                           Anesthesia Physical Anesthesia Plan  ASA: III  Anesthesia Plan: General   Post-op Pain Management:    Induction: Intravenous  Airway Management Planned: Oral ETT  Additional Equipment:   Intra-op Plan:   Post-operative Plan: Extubation in OR  Informed Consent: I have reviewed the patients History and Physical, chart, labs and discussed the procedure including the risks, benefits and alternatives for the proposed anesthesia with the patient or authorized representative who has indicated his/her understanding and acceptance.     Plan Discussed with: CRNA and Surgeon  Anesthesia Plan Comments:         Anesthesia Quick Evaluation

## 2013-03-27 NOTE — Anesthesia Postprocedure Evaluation (Signed)
Anesthesia Post Note  [name]  Procedure(s) Performed: Procedure(s) (LRB): REEXPLORATION OF LUMBAR FUSION WITH REPLACEMENT OF SACRAL SCREWS AND ILIAC CREST BONE GRAFT REDO DECOMPRESSION LAMINECTOMY LUMBAR FIVE SACRAL ONE (N/A)  Anesthesia type: general  Patient location: PACU  Post pain: Pain level controlled  Post assessment: Patient's Cardiovascular Status Stable  Last Vitals:  Filed Vitals:   03/27/13 1458  BP: 109/51  Pulse: 68  Temp: 36.1 C  Resp: 15    Post vital signs: Reviewed and stable  Level of consciousness: sedated  Complications: No apparent anesthesia complications

## 2013-03-27 NOTE — Anesthesia Procedure Notes (Signed)
Procedure Name: Intubation Date/Time: 03/27/2013 8:45 AM Performed by: Sherie Don Pre-anesthesia Checklist: Patient identified, Emergency Drugs available, Suction available, Patient being monitored and Timeout performed Patient Re-evaluated:Patient Re-evaluated prior to inductionOxygen Delivery Method: Circle system utilized Preoxygenation: Pre-oxygenation with 100% oxygen Intubation Type: IV induction Ventilation: Mask ventilation without difficulty Laryngoscope Size: Mac and 4 Grade View: Grade II Tube type: Oral Tube size: 7.5 mm Number of attempts: 1 Airway Equipment and Method: Stylet Placement Confirmation: ETT inserted through vocal cords under direct vision,  positive ETCO2 and breath sounds checked- equal and bilateral Secured at: 23 cm Tube secured with: Tape Dental Injury: Teeth and Oropharynx as per pre-operative assessment

## 2013-03-27 NOTE — Preoperative (Signed)
Beta Blockers   Reason not to administer Beta Blockers:Not Applicable 

## 2013-03-27 NOTE — H&P (Signed)
Phillip Meyer is an 72 y.o. male.   Chief Complaint: Back and right leg pain HPI: Patient is a very pleasant 24 year old son who underwent L5-S1 extension of his fusion back about 3 months ago and started developing worsening back pain and right leg pain workup revealed loosening of his S1 screws with a patent spinal canal. Due to patient's imaging findings consistent with loosening S1 screws progression of clinical syndrome with pain radiculopathy I recommended revision of his S1 screws with replacement of the sacral screws with alar screws and iliac crest bone graft. I extensively went over the risks and benefits of the operation with him as well as perioperative course and expectations about alternatives of surgery he understood and agreed to proceed forward.  Past Medical History  Diagnosis Date  . Hypertension   . Diabetes mellitus without complication   . CHF (congestive heart failure)   . Arthritis   . Dysrhythmia     a fib  . Left foot drop     wears brace  . Hyperlipidemia   . History of snoring     pt had sleep study but does not have sleep apnea    Past Surgical History  Procedure Laterality Date  . Back surgery      x6  . Cardiac catheterization      [facility]    History reviewed. No pertinent family history. Social History:  reports that he has never smoked. He does not have any smokeless tobacco history on file. He reports that he does not drink alcohol or use illicit drugs.  Allergies:  Allergies  Allergen Reactions  . Elavil [Amitriptyline] Other (See Comments)    Passes out    Medications Prior to Admission  Medication Sig Dispense Refill  . aspirin EC 81 MG tablet Take 81 mg by mouth daily.      Marland Kitchen diltiazem (CARDIZEM CD) 120 MG 24 hr capsule Take 120 mg by mouth daily.      . enalapril (VASOTEC) 20 MG tablet Take 20 mg by mouth 2 (two) times daily.       . ferrous sulfate 325 (65 FE) MG tablet Take 325 mg by mouth daily with breakfast.      . furosemide  (LASIX) 40 MG tablet Take 40 mg by mouth 2 (two) times daily.      Marland Kitchen gabapentin (NEURONTIN) 600 MG tablet Take 600 mg by mouth 3 (three) times daily.       . insulin glargine (LANTUS) 100 UNIT/ML injection Inject 60 Units into the skin at bedtime.      . insulin regular (NOVOLIN R,HUMULIN R) 100 units/mL injection Inject 30 Units into the skin 3 (three) times daily before meals.      . meclizine (ANTIVERT) 25 MG tablet Take 25 mg by mouth 3 (three) times daily.       . Omega-3 Fatty Acids (FISH OIL) 1200 MG CAPS Take 1 capsule by mouth 2 (two) times daily.      Marland Kitchen PRESCRIPTION MEDICATION Place 1 application into both ears daily as needed (ear discomfort).      Marland Kitchen rOPINIRole (REQUIP) 2 MG tablet Take 2 mg by mouth 2 (two) times daily.      . simvastatin (ZOCOR) 10 MG tablet Take 10 mg by mouth at bedtime.      . sotalol (BETAPACE) 80 MG tablet Take 80 mg by mouth 2 (two) times daily.      . traMADol (ULTRAM) 50 MG tablet Take 50 mg by  mouth every 6 (six) hours as needed for pain.      Marland Kitchen warfarin (COUMADIN) 5 MG tablet Take 5-7.5 mg by mouth daily. Take 7.5mg  tablet on Tues., Thurs, and Sat. Take 5mg  tablet all other days.        Results for orders placed during the hospital encounter of 03/27/13 (from the past 48 hour(s))  PROTIME-INR     Status: None   Collection Time    03/27/13  6:23 AM      Result Value Range   Prothrombin Time 13.2  11.6 - 15.2 seconds   INR 1.02  0.00 - 1.49  GLUCOSE, CAPILLARY     Status: None   Collection Time    03/27/13  6:48 AM      Result Value Range   Glucose-Capillary 82  70 - 99 mg/dL   No results found.  Review of Systems  Constitutional: Negative.   HENT: Negative.   Eyes: Negative.   Respiratory: Negative.   Cardiovascular: Negative.   Gastrointestinal: Negative.   Genitourinary: Negative.   Musculoskeletal: Positive for back pain and myalgias.  Skin: Negative.   Neurological: Positive for tingling and sensory change.  Endo/Heme/Allergies:  Negative.   Psychiatric/Behavioral: Negative.     Blood pressure 123/72, pulse 71, temperature 97.9 F (36.6 C), temperature source Oral, resp. rate 18, SpO2 95.00%. Physical Exam  Constitutional: He is oriented to person, place, and time. He appears well-developed and well-nourished.  HENT:  Head: Normocephalic.  Eyes: Pupils are equal, round, and reactive to light.  Neck: Normal range of motion.  GI: Soft.  Musculoskeletal: Normal range of motion.  Neurological: He is alert and oriented to person, place, and time. GCS eye subscore is 4. GCS verbal subscore is 5. GCS motor subscore is 6.  Reflex Scores:      Patellar reflexes are 0 on the right side and 0 on the left side.      Achilles reflexes are 0 on the right side and 0 on the left side. Strength is 5 out of 5 in the right leg and iliopsoas quads and she's gastrocs EHL the left leg has a complete foot drop that is old and baseline for him Phillip Meyer is 5 out of 5     Assessment/Plan 72 year old presents for revision of his S1 fusion.  Phillip Meyer P 03/27/2013, 8:08 AM

## 2013-03-27 NOTE — Op Note (Signed)
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  Harvest iliac crest bone graft to a separate skin incision  Surgeon: Jillyn Hidden Trany Chernick  Assistant: Marikay Alar  Anesthesia: Gen.  EBL: 400  History of present illness: Patient is a very pleasant 72 year old gentleman who 3 months ago underwent an and extension his fusion to include L5-S1 patient developed recurrent back and right leg pain workup revealed significant haloing loosening of his S1 pedicle screws patient recommended expiration of fusion removal of hardware and revision with redo decompressive laminectomy and foraminotomies I extensively reviewed the risks and benefits of the operation the patient as well as  Course expectations about alternatives of surgery he understood and agreed to proceed forward.  Operative procedure: Patient brought into the or was induced under general anesthesia positioned prone the Wilson frame his back was prepped and draped in routine sterile fashion. A right-sided incision was made over the right iliac crest approximately starting 4 fingerbreadths off the midline. The subcutaneous tissue was dissected free and subperiosteal this was dissected with care along the lateral aspect of the right iliac crest. Then using commissure chisel gouges cancellus and cortical cancellus bone was harvested from the lateral aspect of the iliac crest. When adequate amount of bone graft been achieved this was packed with dry Gelfoam  copiously irrigated a drain was placed and the wounds closed in layers with after Vicryl a running 4 subcuticular. The inferior aspect of his old incision was opened up and the scar tissues dissected free until the hardware was identified and exposed 3 sacrum was identified the iliac crest was also identified until I dissected out and identified the posterior superior iliac spines then the hardware was removed and the S1 screws were noted be extremely loose upon removal the S1 screws there was a tremendous amount of inflammatory grimace material extending down into the laminotomy defect and L5-S1 this is all teased away with a combination of 4 Penfield gentle dissection and a 1 Penfield. Redo foraminotomies were performed bilaterally L5-S1 nerve root until both foramina easily accepted nerve hook the or was extensive amount of scar tissue at both sides at this level. After adequate foraminotomies been achieved attention second the iliac screw placement I elected to iliac screws and set her a lot secondary to increased stability but also secondary to question of a sacral alar fracture on the left side. So using AP and lateral fluoroscopy the posterior superior iliac spine was identified entry point was identified the pedicles cannulated with the awl probed tapped with a 75 Probed again and 8 5 x 55 screws inserted the patient's left and a 5 x 60 on the right all screws excellent purchase and the heads were countersunk into the iliac crest. Then after adequate screw placement the been achieved and confirmed with fluoroscopy. Aggressive decortication was care at along the TPS lateral sacral ala and iliac wing. The local autograft harvested from the iliac crest was then packed posterior laterally rods were then fashioned and contoured and placed all top tightness replaced and anchored down. The foraminal  reinspected confirm patency Gelfoam was laid over the dura a drain was placed and the wounds closed in layers with  after Vicryl the skin was closed running 4 subcuticular benzo and Steri-Strips were applied patient recovered in stable condition. At the end of case all needle counts sponge counts were correct.

## 2013-03-27 NOTE — Transfer of Care (Signed)
Immediate Anesthesia Transfer of Care Note  [name]  Procedure(s) Performed: Procedure(s): REEXPLORATION OF LUMBAR FUSION WITH REPLACEMENT OF SACRAL SCREWS AND ILIAC CREST BONE GRAFT REDO DECOMPRESSION LAMINECTOMY LUMBAR FIVE SACRAL ONE (N/A)  Patient Location: PACU  Anesthesia Type:General  Level of Consciousness: awake, oriented and patient cooperative  Airway & Oxygen Therapy: Patient Spontanous Breathing and Patient connected to face mask oxygen  Post-op Assessment: Report given to PACU RN and Post -op Vital signs reviewed and stable  Post vital signs: Reviewed and stable  Complications: No apparent anesthesia complications

## 2013-03-28 LAB — GLUCOSE, CAPILLARY
Glucose-Capillary: 148 mg/dL — ABNORMAL HIGH (ref 70–99)
Glucose-Capillary: 238 mg/dL — ABNORMAL HIGH (ref 70–99)
Glucose-Capillary: 254 mg/dL — ABNORMAL HIGH (ref 70–99)
Glucose-Capillary: 233 mg/dL — ABNORMAL HIGH (ref 70–99)

## 2013-03-28 LAB — PROTIME-INR: INR: 1.21 (ref 0.00–1.49)

## 2013-03-28 MED ORDER — INSULIN ASPART 100 UNIT/ML ~~LOC~~ SOLN: 0.0000 [IU] | Freq: Three times a day (TID) | SUBCUTANEOUS | Status: DC

## 2013-03-28 MED ORDER — INSULIN ASPART 100 UNIT/ML ~~LOC~~ SOLN
0.0000 [IU] | Freq: Every day | SUBCUTANEOUS | Status: DC
  Administered 2013-03-29: 3 [IU] via SUBCUTANEOUS

## 2013-03-28 MED ORDER — INSULIN ASPART 100 UNIT/ML ~~LOC~~ SOLN
0.0000 [IU] | Freq: Three times a day (TID) | SUBCUTANEOUS | Status: DC
  Administered 2013-03-28: 7 [IU] via SUBCUTANEOUS
  Administered 2013-03-28 – 2013-03-29 (×4): 11 [IU] via SUBCUTANEOUS
  Administered 2013-03-30: 7 [IU] via SUBCUTANEOUS

## 2013-03-28 NOTE — Progress Notes (Signed)
OT Cancellation Note  Patient Details Name: Phillip Meyer MRN: [id number] DOB: 10/30/1940   Cancelled Treatment:    Reason Eval/Treat Not Completed: Fatigue/lethargy limiting ability to participate. Pt very pleasant but declining OT session at this time, states he has been up walking several times today and just got back to bed after using bathroom.  Would like OT to return later.  Will re-attempt later today as schedule allows.  03/28/2013 Cipriano Mile OTR/L Pager [phone] Office [phone]

## 2013-03-28 NOTE — Evaluation (Signed)
Occupational Therapy Evaluation Patient Details Name: Phillip Meyer MRN: [id number] DOB: 03/10/1941 Today's Date: 03/28/2013 Time: 6213-0865 OT Time Calculation (min): 18 min  OT Assessment / Plan / Recommendation History of present illness pt presents for revision of L5-S1 Fusion.     Clinical Impression   Pt admitted with above. Will continue to follow acutely in order to address below problem list in prep for pt's return home with assist from son.   OT Assessment  Patient needs continued OT Services    Follow Up Recommendations  No OT follow up;Supervision/Assistance - 24 hour    Barriers to Discharge      Equipment Recommendations  None recommended by OT    Recommendations for Other Services    Frequency  Min 2X/week    Precautions / Restrictions Precautions Precautions: Back Precaution Booklet Issued: Yes (comment) Precaution Comments: Reviewed 3/3 back precautions. Required Braces or Orthoses: Spinal Brace Spinal Brace: Lumbar corset;Applied in sitting position   Pertinent Vitals/Pain See vitals    ADL  Grooming: Performed;Wash/dry hands;Min guard Where Assessed - Grooming: Unsupported standing Upper Body Bathing: Simulated;Supervision/safety Where Assessed - Upper Body Bathing: Unsupported sitting Lower Body Bathing: Simulated;Minimal assistance Where Assessed - Lower Body Bathing: Unsupported sit to stand Upper Body Dressing: Simulated;Supervision/safety Where Assessed - Upper Body Dressing: Unsupported sitting Lower Body Dressing: Performed;Minimal assistance Where Assessed - Lower Body Dressing: Unsupported sit to stand Toilet Transfer: Performed;Min guard Toilet Transfer Method: Sit to Barista: Comfort height toilet;Grab bars Toileting - Clothing Manipulation and Hygiene: Performed;Min guard Where Assessed - Engineer, mining and Hygiene: Sit to stand from 3-in-1 or toilet Equipment Used: Rolling  walker Transfers/Ambulation Related to ADLs: min guard with RW ADL Comments: Cueing during grooming task to avoid twisting and to turn all the way around when reaching for paper towel or to throw trash away.  Pt states he usually prop his legs up on chair or bed in order to avoid bending but too stiff to use this technique this afternoon.    OT Diagnosis: Generalized weakness  OT Problem List: Decreased knowledge of use of DME or AE;Decreased knowledge of precautions;Decreased strength;Decreased activity tolerance OT Treatment Interventions: Self-care/ADL training;DME and/or AE instruction;Therapeutic activities;Patient/family education   OT Goals(Current goals can be found in the care plan section) Acute Rehab OT Goals Patient Stated Goal: Home OT Goal Formulation: With patient Time For Goal Achievement: 04/04/13 Potential to Achieve Goals: Good  Visit Information  Last OT Received On: 03/28/13 Assistance Needed: +1 Reason Eval/Treat Not Completed: Fatigue/lethargy limiting ability to participate History of Present Illness: pt presents for revision of L5-S1 Fusion.         Prior Functioning     Home Living Family/patient expects to be discharged to:: Private residence Living Arrangements: Alone Available Help at Discharge: Family;Available 24 hours/day Type of Home: House Home Access: Ramped entrance Home Layout: One level Home Equipment: Walker - 2 wheels;Cane - quad;Cane - single point;Grab bars - tub/shower;Grab bars - toilet;Shower seat - built in Prior Function Level of Independence: Independent with assistive device(s) Communication Communication: No difficulties Dominant Hand: Right         Vision/Perception     Cognition  Cognition Arousal/Alertness: Awake/alert Behavior During Therapy: WFL for tasks assessed/performed Overall Cognitive Status: Within Functional Limits for tasks assessed    Extremity/Trunk Assessment Upper Extremity Assessment Upper  Extremity Assessment: Overall WFL for tasks assessed     Mobility Bed Mobility Bed Mobility: Not assessed (pt up in chair) Transfers Transfers:  Sit to Stand;Stand to Sit Sit to Stand: 4: Min guard;From chair/3-in-1;From toilet;With upper extremity assist Stand to Sit: 4: Min guard;To chair/3-in-1;To toilet Details for Transfer Assistance: safe technique     Exercise     Balance     End of Session OT - End of Session Equipment Utilized During Treatment: Gait belt;Rolling walker Activity Tolerance: Patient tolerated treatment well Patient left: in chair;with call bell/phone within reach Nurse Communication: Mobility status  GO   03/28/2013 Cipriano Mile OTR/L Pager [phone] Office [phone]   Cipriano Mile 03/28/2013, 4:18 PM

## 2013-03-28 NOTE — Progress Notes (Signed)
Referred to this CSW today for ?SNF. Chart reviewed and noted PT indicating not for  SNF and plans for d/c home. CSW to sign off- please contact us if SW needs arise. Reece Levy, MSW [phone]

## 2013-03-28 NOTE — Progress Notes (Signed)
Patient ID: Phillip Meyer, male   DOB: January 08, 1941, 72 y.o.   MRN: [id number] Mr. Forbush is doing great postop day 1 from a revision of L5-S1 fusion. He said complete resolution of his preoperative radicular symptoms.  Strength is at its baseline 5 of 5 in the right looks to me left-sided baseline for drop  Wound clean and dry progressive mobilization today with physical therapy

## 2013-03-28 NOTE — Evaluation (Signed)
Physical Therapy Evaluation Patient Details Name: Phillip Meyer MRN: [id number] DOB: 1940/07/25 Today's Date: 03/28/2013 Time: 4098-1191 PT Time Calculation (min): 24 min  PT Assessment / Plan / Recommendation History of Present Illness  pt presents for revision of L5-S1 Fusion.    Clinical Impression  Pt with good technique and recall of back precautions from previous surgery ~86months ago.  Pt's son to stay with him at D/C.  Will continue to follow.      PT Assessment  Patient needs continued PT services    Follow Up Recommendations  No PT follow up;Supervision for mobility/OOB    Does the patient have the potential to tolerate intense rehabilitation      Barriers to Discharge        Equipment Recommendations  None recommended by PT    Recommendations for Other Services     Frequency Min 5X/week    Precautions / Restrictions Precautions Precautions: Back Precaution Booklet Issued: Yes (comment) Required Braces or Orthoses: Spinal Brace Spinal Brace: Lumbar corset;Applied in sitting position Restrictions Weight Bearing Restrictions: No   Pertinent Vitals/Pain "A little sore."        Mobility  Bed Mobility Bed Mobility: Rolling Right;Right Sidelying to Sit;Sitting - Scoot to Edge of Bed;Sit to Sidelying Left Rolling Right: 5: Supervision;With rail Right Sidelying to Sit: 5: Supervision;With rails Sitting - Scoot to Edge of Bed: 5: Supervision Sit to Sidelying Left: 5: Supervision Details for Bed Mobility Assistance: pt demos good technique.   Transfers Transfers: Sit to Stand;Stand to Sit Sit to Stand: 4: Min guard;With upper extremity assist;From bed Stand to Sit: 4: Min guard;With upper extremity assist;To bed Details for Transfer Assistance: Demos good use of UEs.   Ambulation/Gait Ambulation/Gait Assistance: 4: Min guard Ambulation Distance (Feet): 150 Feet Assistive device: Rolling walker Ambulation/Gait Assistance Details: cues for upright posture.   pt notes he stands back from RW as his L foot tends to hit RW wheel if he stands in RW.   Gait Pattern: Step-through pattern;Decreased stride length;Trunk flexed;Decreased dorsiflexion - left Stairs: No Wheelchair Mobility Wheelchair Mobility: No    Exercises     PT Diagnosis: Difficulty walking  PT Problem List: Decreased activity tolerance;Decreased balance;Decreased mobility;Decreased knowledge of use of DME;Decreased knowledge of precautions PT Treatment Interventions: DME instruction;Gait training;Functional mobility training;Therapeutic activities;Therapeutic exercise;Balance training;Patient/family education     PT Goals(Current goals can be found in the care plan section) Acute Rehab PT Goals Patient Stated Goal: Home PT Goal Formulation: With patient Time For Goal Achievement: 04/11/13 Potential to Achieve Goals: Good  Visit Information  Last PT Received On: 03/28/13 Assistance Needed: +1 History of Present Illness: pt presents for revision of L5-S1 Fusion.         Prior Functioning  Home Living Family/patient expects to be discharged to:: Private residence Living Arrangements: Alone Available Help at Discharge: Family;Available 24 hours/day Type of Home: House Home Access: Ramped entrance Home Layout: One level Home Equipment: Walker - 2 wheels;Cane - quad;Cane - single point;Grab bars - tub/shower;Grab bars - toilet;Shower seat - built in Prior Function Level of Independence: Independent with assistive device(s) Communication Communication: No difficulties Dominant Hand: Right    Cognition  Cognition Arousal/Alertness: Awake/alert Behavior During Therapy: WFL for tasks assessed/performed Overall Cognitive Status: Within Functional Limits for tasks assessed    Extremity/Trunk Assessment Upper Extremity Assessment Upper Extremity Assessment: Defer to OT evaluation Lower Extremity Assessment Lower Extremity Assessment: LLE deficits/detail LLE Deficits /  Details: L foot drop from previous back surgery.  Cervical / Trunk Assessment Cervical / Trunk Assessment: Kyphotic   Balance Balance Balance Assessed: Yes Static Standing Balance Static Standing - Balance Support: Bilateral upper extremity supported Static Standing - Level of Assistance: 5: Stand by assistance  End of Session PT - End of Session Equipment Utilized During Treatment: Gait belt;Back brace Activity Tolerance: Patient tolerated treatment well Patient left: in bed;with call bell/phone within reach;with family/visitor present Nurse Communication: Mobility status  GP     Sunny Schlein, [place] [phone] 03/28/2013, 12:08 PM

## 2013-03-29 ENCOUNTER — Encounter (HOSPITAL_COMMUNITY): Payer: Self-pay | Admitting: Neurosurgery

## 2013-03-29 LAB — CBC WITH DIFFERENTIAL/PLATELET
Basophils Absolute: 0 10*3/uL (ref 0.0–0.1)
Basophils Relative: 0 % (ref 0–1)
Eosinophils Absolute: 0.1 10*3/uL (ref 0.0–0.7)
Eosinophils Relative: 1 % (ref 0–5)
HCT: 32.1 % — ABNORMAL LOW (ref 39.0–52.0)
Hemoglobin: 10.8 g/dL — ABNORMAL LOW (ref 13.0–17.0)
Lymphs Abs: 1.2 10*3/uL (ref 0.7–4.0)
MCH: 31.2 pg (ref 26.0–34.0)
MCHC: 33.6 g/dL (ref 30.0–36.0)
MCV: 92.8 fL (ref 78.0–100.0)
Monocytes Absolute: 1 10*3/uL (ref 0.1–1.0)
Platelets: 222 10*3/uL (ref 150–400)
RBC: 3.46 MIL/uL — ABNORMAL LOW (ref 4.22–5.81)
RDW: 14.1 % (ref 11.5–15.5)
WBC: 9.1 10*3/uL (ref 4.0–10.5)

## 2013-03-29 LAB — GLUCOSE, CAPILLARY
Glucose-Capillary: 256 mg/dL — ABNORMAL HIGH (ref 70–99)
Glucose-Capillary: 272 mg/dL — ABNORMAL HIGH (ref 70–99)

## 2013-03-29 LAB — WOUND CULTURE
Culture: NO GROWTH
Gram Stain: NONE SEEN

## 2013-03-29 LAB — PROTIME-INR
INR: 1.25 (ref 0.00–1.49)
Prothrombin Time: 15.4 seconds — ABNORMAL HIGH (ref 11.6–15.2)

## 2013-03-29 MED ORDER — CYCLOBENZAPRINE HCL 10 MG PO TABS: 10.0000 mg | ORAL_TABLET | Freq: Three times a day (TID) | ORAL | Status: DC | PRN

## 2013-03-29 MED ORDER — OXYCODONE-ACETAMINOPHEN 5-325 MG PO TABS: 1.0000 | ORAL_TABLET | ORAL | Status: DC | PRN

## 2013-03-29 NOTE — Discharge Summary (Signed)
Physician Discharge Summary  Patient ID: Phillip Meyer MRN: [id number] DOB/AGE: 72-Jun-1942 72 y.o.  Admit date: 03/27/2013 Discharge date: 03/29/2013  Admission Diagnoses: Lumbar pseudoarthrosis with failed hardware and loose S1 screws with right-sided L5 radiculopathy  Discharge Diagnoses: Same Active Problems:   * No active hospital problems. *   Discharged Condition: good  Hospital Course: Patient hospital underwent a the operating room underwent a revision of his fusion with removal of his S1 screws redo decompressions and foraminotomies and replacement with iliac screws. Patient is numbered well with recovered in the floor on the floor he was convalescing well ambulating and voiding spontaneously tolerating regular diet was stable for discharge, day 2 with significant proven his preoperative radicular symptoms and his back pain.  Consults: Significant Diagnostic Studies: Treatments: Revision of fusion L5-S1 Discharge Exam: Blood pressure 96/44, pulse 94, temperature 98.2 F (36.8 C), temperature source Oral, resp. rate 18, height 5\' 11"  (1.803 m), weight 128.029 kg (282 lb 4 oz), SpO2 99.00%. Strength out of 5 in his right lower extremity left baseline for drop wound dry  Disposition: Home     Medication List         aspirin EC 81 MG tablet  Take 81 mg by mouth daily.     cyclobenzaprine 10 MG tablet  Commonly known as:  FLEXERIL  Take 1 tablet (10 mg total) by mouth 3 (three) times daily as needed for muscle spasms.     diltiazem 120 MG 24 hr capsule  Commonly known as:  CARDIZEM CD  Take 120 mg by mouth daily.     enalapril 20 MG tablet  Commonly known as:  VASOTEC  Take 20 mg by mouth 2 (two) times daily.     ferrous sulfate 325 (65 FE) MG tablet  Take 325 mg by mouth daily with breakfast.     Fish Oil 1200 MG Caps  Take 1 capsule by mouth 2 (two) times daily.     furosemide 40 MG tablet  Commonly known as:  LASIX  Take 40 mg by mouth 2 (two) times  daily.     gabapentin 600 MG tablet  Commonly known as:  NEURONTIN  Take 600 mg by mouth 3 (three) times daily.     insulin glargine 100 UNIT/ML injection  Commonly known as:  LANTUS  Inject 60 Units into the skin at bedtime.     insulin regular 100 units/mL injection  Commonly known as:  NOVOLIN R,HUMULIN R  Inject 30 Units into the skin 3 (three) times daily before meals.     meclizine 25 MG tablet  Commonly known as:  ANTIVERT  Take 25 mg by mouth 3 (three) times daily.     oxyCODONE-acetaminophen 5-325 MG per tablet  Commonly known as:  PERCOCET/ROXICET  Take 1-2 tablets by mouth every 4 (four) hours as needed.     PRESCRIPTION MEDICATION  Place 1 application into both ears daily as needed (ear discomfort).     rOPINIRole 2 MG tablet  Commonly known as:  REQUIP  Take 2 mg by mouth 2 (two) times daily.     simvastatin 10 MG tablet  Commonly known as:  ZOCOR  Take 10 mg by mouth at bedtime.     sotalol 80 MG tablet  Commonly known as:  BETAPACE  Take 80 mg by mouth 2 (two) times daily.     traMADol 50 MG tablet  Commonly known as:  ULTRAM  Take 50 mg by mouth every 6 (six) hours as  needed for pain.     warfarin 5 MG tablet  Commonly known as:  COUMADIN  Take 5-7.5 mg by mouth daily. Take 7.5mg  tablet on Tues., Thurs, and Sat. Take 5mg  tablet all other days.           Follow-up Information   Follow up with [facility], MD.   Specialty:  Neurosurgery   Contact information:   [address] [place] [address] [phone]       Signed: Elexia Friedt P 03/29/2013, 4:20 PM

## 2013-03-29 NOTE — Progress Notes (Signed)
Patient ID: Phillip Meyer, male   DOB: 07/22/1940, 72 y.o.   MRN: [id number] Patient is doing very well pain is well controlled he is having no radicular symptoms plan discharge later today

## 2013-03-29 NOTE — Progress Notes (Signed)
Occupational Therapy Treatment Patient Details Name: ISHMEAL RORIE MRN: [id number] DOB: Oct 19, 1940 Today's Date: 03/29/2013 Time: 5621-3086 OT Time Calculation (min): 23 min  OT Assessment / Plan / Recommendation       OT comments  Awake upon arrival and agreeable to participation in skilled o.t.,   Demonstrates good log roll tech. Out of bed with no cues required.  Donned brace with set up.  Recalled 2/3 precautions. States son has taken several weeks off of work to assist at home.  States son is an Astronomer. And will be able to help him with all LB aspects of care. Pt.    Follow Up Recommendations  No OT follow up;Supervision/Assistance - 24 hour           Equipment Recommendations  None recommended by OT        Frequency Min 2X/week   Progress towards OT Goals Progress towards OT goals: Progressing toward goals  Plan Discharge plan remains appropriate    Precautions / Restrictions Precautions Precautions: Back Precaution Comments: reviewed 3/3 back precautions, able to state 2/3, cues for no arching Spinal Brace: Applied in sitting position;Lumbar corset   Pertinent Vitals/Pain 0/10 per pt.    ADL  Upper Body Dressing: Performed;Set up Lower Body Dressing: Other (comment) Toilet Transfer: Performed;Min guard Toilet Transfer Method: Sit to Barista: Comfort height toilet;Grab bars Toileting - Clothing Manipulation and Hygiene: Performed;Min guard Where Assessed - Engineer, mining and Hygiene: Standing;Sit on 3-in-1 or toilet Tub/Shower Transfer: Simulated;Min guard Tub/Shower Transfer Method: Science writer: Walk in shower;Grab bars Equipment Used: Back brace;Rolling walker Transfers/Ambulation Related to ADLs: min guard with RW ADL Comments: pt. states he has all a/e but does not use any of it except the reacher. reports son has taken several weeks off of work to be with him 24/7 and will assist with LB  B/D.  able to don brace with set up, sim. shower transfer reports no ledge. reviewed that he needs to have someone with him for initial transfer and not to attempt on his own.  verbalized understanding     OT Goals(current goals can now be found in the care plan section)    Visit Information  Last OT Received On: 03/29/13    Subjective Data   "i have had 7 back surgeries so i know all of this stuff but i will do whatever you want me to"          Cognition  Cognition Arousal/Alertness: Awake/alert Behavior During Therapy: WFL for tasks assessed/performed Overall Cognitive Status: Within Functional Limits for tasks assessed    Mobility  Bed Mobility Details for Bed Mobility Assistance: pt. demos good log roll tech. with no cues required, hob flat no rails Transfers Transfers: Sit to Stand;Stand to Sit Sit to Stand: 4: Min guard;From bed Stand to Sit: 4: Min guard;With armrests;To chair/3-in-1               End of Session OT - End of Session Equipment Utilized During Treatment: Rolling walker;Back brace Activity Tolerance: Patient tolerated treatment well Patient left: in chair;with call bell/phone within reach       Robet Leu, COTA/L 03/29/2013, 7:22 AM

## 2013-03-29 NOTE — Progress Notes (Signed)
Physical Therapy Treatment Patient Details Name: Phillip Meyer MRN: [id number] DOB: Apr 10, 1941 Today's Date: 03/29/2013 Time: 2440-1027 PT Time Calculation (min): 13 min  PT Assessment / Plan / Recommendation  History of Present Illness pt presents for revision of L5-S1 Fusion.     PT Comments   Pt moving well and continues to make progress.  Will continue to follow.    Follow Up Recommendations  No PT follow up;Supervision for mobility/OOB     Does the patient have the potential to tolerate intense rehabilitation     Barriers to Discharge        Equipment Recommendations  None recommended by PT    Recommendations for Other Services    Frequency Min 5X/week   Progress towards PT Goals Progress towards PT goals: Progressing toward goals  Plan Current plan remains appropriate    Precautions / Restrictions Precautions Precautions: Back Precaution Comments: pt verbalized 3/3 back precautions.   Required Braces or Orthoses: Spinal Brace Spinal Brace: Applied in sitting position;Lumbar corset Restrictions Weight Bearing Restrictions: No   Pertinent Vitals/Pain Denied pain.      Mobility  Bed Mobility Bed Mobility: Sit to Sidelying Right Sit to Sidelying Right: 5: Supervision Details for Bed Mobility Assistance: pt. demos good log roll tech. with no cues required, hob flat no rails Transfers Transfers: Sit to Stand;Stand to Sit Sit to Stand: 4: Min guard;With upper extremity assist;From chair/3-in-1 Stand to Sit: 4: Min guard;With upper extremity assist;To bed Details for Transfer Assistance: safe technique Ambulation/Gait Ambulation/Gait Assistance: 4: Min guard Ambulation Distance (Feet): 150 Feet Assistive device: Rolling walker Ambulation/Gait Assistance Details: cues to stay closer to RW and upright posture.   Gait Pattern: Step-through pattern;Decreased stride length;Trunk flexed;Decreased dorsiflexion - left Stairs: No Wheelchair Mobility Wheelchair  Mobility: No    Exercises     PT Diagnosis:    PT Problem List:   PT Treatment Interventions:     PT Goals (current goals can now be found in the care plan section) Acute Rehab PT Goals Patient Stated Goal: Home PT Goal Formulation: With patient Time For Goal Achievement: 04/11/13 Potential to Achieve Goals: Good  Visit Information  Last PT Received On: 03/29/13 Assistance Needed: +1 History of Present Illness: pt presents for revision of L5-S1 Fusion.      Subjective Data  Patient Stated Goal: Home   Cognition  Cognition Arousal/Alertness: Awake/alert Behavior During Therapy: WFL for tasks assessed/performed Overall Cognitive Status: Within Functional Limits for tasks assessed    Balance  Balance Balance Assessed: No  End of Session PT - End of Session Equipment Utilized During Treatment: Gait belt;Back brace Activity Tolerance: Patient tolerated treatment well Patient left: in bed;with call bell/phone within reach Nurse Communication: Mobility status   GP     Sunny Schlein, [place] [phone] 03/29/2013, 8:57 AM

## 2013-03-30 LAB — GLUCOSE, CAPILLARY

## 2013-03-30 MED ORDER — INFLUENZA VAC SPLIT QUAD 0.5 ML IM SUSP
0.5000 mL | INTRAMUSCULAR | Status: AC
  Administered 2013-03-30: 0.5 mL via INTRAMUSCULAR
  Filled 2013-03-30: qty 0.5

## 2013-03-30 MED ORDER — INFLUENZA VAC SPLIT QUAD 0.5 ML IM SUSP: 0.5000 mL | INTRAMUSCULAR | Status: DC

## 2013-03-30 NOTE — Progress Notes (Signed)
Pt for discharge home today.  IV D/C.   D/C instructions and Rx given with verbalized understanding.  Family at bedside to assist pt with discharge. Staff brought pt downstairs via wheelchair.  

## 2013-03-30 NOTE — Progress Notes (Signed)
Physical Therapy Treatment Patient Details Name: Phillip Meyer MRN: [id number] DOB: 1940-10-23 Today's Date: 03/30/2013 Time: 1478-2956 PT Time Calculation (min): 11 min  PT Assessment / Plan / Recommendation  History of Present Illness pt presents for revision of L5-S1 Fusion.     PT Comments   Patient moving very well. Planning to DC home this morning. Reviewed precautions with son present. Son will be primary caregiver for a while.   Follow Up Recommendations  No PT follow up;Supervision for mobility/OOB     Does the patient have the potential to tolerate intense rehabilitation     Barriers to Discharge        Equipment Recommendations  None recommended by PT    Recommendations for Other Services    Frequency Min 5X/week   Progress towards PT Goals Progress towards PT goals: Progressing toward goals  Plan Current plan remains appropriate    Precautions / Restrictions Precautions Precautions: Back Precaution Comments: pt verbalized 3/3 back precautions.   Required Braces or Orthoses: Spinal Brace Spinal Brace: Applied in sitting position;Lumbar corset   Pertinent Vitals/Pain no apparent distress    Mobility  Bed Mobility Bed Mobility: Not assessed Transfers Sit to Stand: 6: Modified independent (Device/Increase time) Stand to Sit: 6: Modified independent (Device/Increase time) Ambulation/Gait Ambulation/Gait Assistance: 6: Modified independent (Device/Increase time) Ambulation Distance (Feet): 300 Feet Assistive device: Rolling walker Gait Pattern: Step-through pattern;Decreased stride length;Trunk flexed;Decreased dorsiflexion - left    Exercises     PT Diagnosis:    PT Problem List:   PT Treatment Interventions:     PT Goals (current goals can now be found in the care plan section)    Visit Information  Last PT Received On: 03/30/13 Assistance Needed: +1 History of Present Illness: pt presents for revision of L5-S1 Fusion.      Subjective Data       Cognition  Cognition Arousal/Alertness: Awake/alert Behavior During Therapy: WFL for tasks assessed/performed Overall Cognitive Status: Within Functional Limits for tasks assessed    Balance     End of Session PT - End of Session Equipment Utilized During Treatment: Back brace Activity Tolerance: Patient tolerated treatment well Patient left: in chair;with family/visitor present;with call bell/phone within reach Nurse Communication: Mobility status   GP     Fredrich Birks 03/30/2013, 8:54 AM  03/30/2013 Fredrich Birks PTA [phone] pager [phone] office

## 2013-03-30 NOTE — Progress Notes (Signed)
No issues overnight. Pt doing well with minimal pain, ambulating, voiding, tolerating diet  EXAM:  BP 112/56  Pulse 83  Temp(Src) 98.4 F (36.9 C) (Oral)  Resp 18  Ht 5\' 11"  (1.803 m)  Wt 128.029 kg (282 lb 4 oz)  BMI 39.38 kg/m2  SpO2 95%  Intake/Output Summary (Last 24 hours) at 03/30/13 0734 Last data filed at 03/29/13 2213  Gross per 24 hour  Intake    483 ml  Output    100 ml  Net    383 ml   Drain - 100cc  Awake, alert, oriented  Speech fluent, appropriate  CN grossly intact  5/5 BUE/BLE   IMPRESSION:  72 y.o. male POD# 3 s/p Revision fusion  PLAN: - remove drain this am - D/C home

## 2013-04-01 LAB — ANAEROBIC CULTURE: Gram Stain: NONE SEEN

## 2013-05-30 ENCOUNTER — Other Ambulatory Visit: Payer: Self-pay | Admitting: Neurosurgery

## 2013-05-30 ENCOUNTER — Ambulatory Visit
Admission: RE | Admit: 2013-05-30 | Discharge: 2013-05-30 | Disposition: A | Discharge status: Home / Self Care | Payer: Medicare Other | Source: Ambulatory Visit | Attending: Neurosurgery | Admitting: Neurosurgery

## 2013-05-30 DIAGNOSIS — M549 Dorsalgia, unspecified: Secondary | ICD-10-CM

## 2013-05-30 DIAGNOSIS — I739 Peripheral vascular disease, unspecified: Secondary | ICD-10-CM

## 2013-08-21 ENCOUNTER — Other Ambulatory Visit: Payer: Self-pay | Admitting: Neurosurgery

## 2013-08-21 DIAGNOSIS — S32009K Unspecified fracture of unspecified lumbar vertebra, subsequent encounter for fracture with nonunion: Secondary | ICD-10-CM

## 2013-09-16 ENCOUNTER — Other Ambulatory Visit: Payer: Medicare Other

## 2013-09-17 ENCOUNTER — Ambulatory Visit
Admission: RE | Admit: 2013-09-17 | Discharge: 2013-09-17 | Disposition: A | Discharge status: Home / Self Care | Payer: Medicare Other | Source: Ambulatory Visit | Attending: Neurosurgery | Admitting: Neurosurgery

## 2013-09-17 DIAGNOSIS — S32009K Unspecified fracture of unspecified lumbar vertebra, subsequent encounter for fracture with nonunion: Secondary | ICD-10-CM

## 2013-10-09 ENCOUNTER — Encounter (HOSPITAL_COMMUNITY): Payer: Self-pay

## 2013-10-10 ENCOUNTER — Other Ambulatory Visit: Payer: Self-pay | Admitting: Neurosurgery

## 2013-10-10 NOTE — Pre-Procedure Instructions (Addendum)
Phillip Meyer  10/10/2013   Your procedure is scheduled on: Wednesday, October 16, 2013 at 4:33 PM  Report to [facility] Short Stay (use Main Entrance "A'') at 2:30 PM.  Call this number if you have problems the morning of surgery: [phone]   Remember:   Do not eat food or drink liquids after midnight. Ask dr Phillip Meyer if can have light breakfast  Take these medicines the morning of surgery with A SIP OF WATER: diltiazem (CARDIZEM CD gabapentin (NEURONTIN), rOPINIRole (REQUIP), sotalol (BETAPACE), if needed: traMADol (ULTRAM) for pain         Take all meds as ordered until day of surgery except as inst below or per dr   Phillip Meyer all herbel meds, nsaids (aleve,naproxen,advil,ibuprofen) now including vitamins           Aspirin and coumadin per dr   Phillip Meyer not wear jewelry, make-up or nail polish.  Do not wear lotions, powders, or perfumes. You may wear deodorant.  Do not shave 48 hours prior to surgery. Men may shave face and neck.  Do not bring valuables to the hospital.  [facility] is not responsible for any belongings or valuables.               Contacts, dentures or bridgework may not be worn into surgery.  Leave suitcase in the car. After surgery it may be brought to your room.  For patients admitted to the hospital, discharge time is determined by your treatment team.               Patients discharged the day of surgery will not be allowed to drive home.  Name and phone number of your driver:  Special Instructions:  Special Instructions:Special Instructions: [facility] - Preparing for Surgery  Before surgery, you can play an important role.  Because skin is not sterile, your skin needs to be as free of germs as possible.  You can reduce the number of germs on you skin by washing with CHG (chlorahexidine gluconate) soap before surgery.  CHG is an antiseptic cleaner which kills germs and bonds with the skin to continue killing germs even after washing.  Please DO NOT use if you have an  allergy to CHG or antibacterial soaps.  If your skin becomes reddened/irritated stop using the CHG and inform your nurse when you arrive at Short Stay.  Do not shave (including legs and underarms) for at least 48 hours prior to the first CHG shower.  You may shave your face.  Please follow these instructions carefully:   1.  Shower with CHG Soap the night before surgery and the morning of Surgery.  2.  If you choose to wash your hair, wash your hair first as usual with your normal shampoo.  3.  After you shampoo, rinse your hair and body thoroughly to remove the Shampoo.  4.  Use CHG as you would any other liquid soap.  You can apply chg directly  to the skin and wash gently with scrungie or a clean washcloth.  5.  Apply the CHG Soap to your body ONLY FROM THE NECK DOWN.  Do not use on open wounds or open sores.  Avoid contact with your eyes, ears, mouth and genitals (private parts).  Wash genitals (private parts) with your normal soap.  6.  Wash thoroughly, paying special attention to the area where your surgery will be performed.  7.  Thoroughly rinse your body with warm water from the neck down.  8.  DO NOT shower/wash with your normal soap after using and rinsing off the CHG Soap.  9.  Pat yourself dry with a clean towel.            10.  Wear clean pajamas.            11.  Place clean sheets on your bed the night of your first shower and do not sleep with pets.  Day of Surgery  Do not apply any lotions the morning of surgery.  Please wear clean clothes to the hospital/surgery center.   Please read over the following fact sheets that you were given: Pain Booklet, Coughing and Deep Breathing, MRSA Information and Surgical Site Infection Prevention

## 2013-10-11 ENCOUNTER — Encounter (HOSPITAL_COMMUNITY)
Admission: RE | Admit: 2013-10-11 | Discharge: 2013-10-11 | Disposition: A | Discharge status: Home / Self Care | Payer: Medicare Other | Source: Ambulatory Visit | Attending: Neurosurgery | Admitting: Neurosurgery

## 2013-10-11 ENCOUNTER — Encounter (HOSPITAL_COMMUNITY): Payer: Self-pay

## 2013-10-11 ENCOUNTER — Encounter (HOSPITAL_COMMUNITY)
Admission: RE | Admit: 2013-10-11 | Discharge: 2013-10-11 | Disposition: A | Discharge status: Home / Self Care | Payer: Medicare Other | Source: Ambulatory Visit | Attending: Anesthesiology | Admitting: Anesthesiology

## 2013-10-11 DIAGNOSIS — Z01818 Encounter for other preprocedural examination: Secondary | ICD-10-CM | POA: Insufficient documentation

## 2013-10-11 DIAGNOSIS — Z01810 Encounter for preprocedural cardiovascular examination: Secondary | ICD-10-CM | POA: Insufficient documentation

## 2013-10-11 DIAGNOSIS — Z01812 Encounter for preprocedural laboratory examination: Secondary | ICD-10-CM | POA: Insufficient documentation

## 2013-10-11 LAB — BASIC METABOLIC PANEL
BUN: 18 mg/dL (ref 6–23)
CALCIUM: 9.4 mg/dL (ref 8.4–10.5)
CO2: 29 meq/L (ref 19–32)
CREATININE: 0.63 mg/dL (ref 0.50–1.35)
Chloride: 99 mEq/L (ref 96–112)
GFR calc Af Amer: >90 mL/min (ref >90)
GFR calc non Af Amer: >90 mL/min (ref >90)
GLUCOSE: 178 mg/dL — AB (ref 70–99)
Potassium: 4 mEq/L (ref 3.7–5.3)
Sodium: 139 mEq/L (ref 137–147)

## 2013-10-11 LAB — CBC
HCT: 41.7 % (ref 39.0–52.0)
Hemoglobin: 14.3 g/dL (ref 13.0–17.0)
MCH: 32.5 pg (ref 26.0–34.0)
MCHC: 34.3 g/dL (ref 30.0–36.0)
MCV: 94.8 fL (ref 78.0–100.0)
PLATELETS: 244 10*3/uL (ref 150–400)
RBC: 4.4 MIL/uL (ref 4.22–5.81)
RDW: 13.8 % (ref 11.5–15.5)
WBC: 10.5 10*3/uL (ref 4.0–10.5)

## 2013-10-11 LAB — SURGICAL PCR SCREEN
MRSA, PCR: NEGATIVE
Staphylococcus aureus: NEGATIVE

## 2013-10-11 NOTE — Progress Notes (Addendum)
Dr Marcie Bal called re: insulin am of surgery.   Patient to ask dr Saintclair Halsted if may have light breakfast am of surgery because of afternoon  surgery time.. Per dr Marcie Bal if patient allowed light breakfast may take 1/2(15 units) dose of insulin. If no food do not take any insulin.food needs to be 8 hours prior to anesthesia. Per dr hodierne anesthesia.  Office called to release orders spoke with vanessa  req'd clearance note from [facility] dr Quentin Cornwall.      Dr  Hulen Skains asking for specifics as in how extensive surgery to be. Chart given to [facility] zelenak pa to follow up.

## 2013-10-14 NOTE — Progress Notes (Signed)
Anesthesiology Chart Review:  73 year old male scheduled for lumbar hardware removal on 4/29. He has a h/o nonischemic cardiomyopathy with persistent afib, h/o CHF EF 35-40% by Echo 2010. 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 He also has htn, Type 2 DM, L. Foot drop, and obesity.  On coumadin for afib held since 4/23.   He has tolerated two previous extensive back surgeries at [facility] on 03/27/13 and 12/26/12 without anesthetic complications.  Labs OK, CXR: mild chronic bibasilar atelectasis, no airspace disease, no pleural effusions.  Will check PT/INR on AM of surgery.  Roberts Gaudy

## 2013-10-16 ENCOUNTER — Encounter (HOSPITAL_COMMUNITY): Payer: Medicare Other | Admitting: Vascular Surgery

## 2013-10-16 ENCOUNTER — Encounter (HOSPITAL_COMMUNITY): Payer: Self-pay | Admitting: Certified Registered Nurse Anesthetist

## 2013-10-16 ENCOUNTER — Encounter (HOSPITAL_COMMUNITY)
Admission: RE | Disposition: A | Discharge status: Home / Self Care | Payer: Self-pay | Source: Ambulatory Visit | Attending: Neurosurgery

## 2013-10-16 ENCOUNTER — Ambulatory Visit (HOSPITAL_COMMUNITY)
Admission: RE | Admit: 2013-10-16 | Discharge: 2013-10-17 | Disposition: A | Discharge status: Home / Self Care | Payer: Medicare Other | Source: Ambulatory Visit | Attending: Neurosurgery | Admitting: Neurosurgery

## 2013-10-16 ENCOUNTER — Inpatient Hospital Stay (HOSPITAL_COMMUNITY): Payer: Medicare Other | Admitting: Anesthesiology

## 2013-10-16 DIAGNOSIS — Z794 Long term (current) use of insulin: Secondary | ICD-10-CM | POA: Insufficient documentation

## 2013-10-16 DIAGNOSIS — M549 Dorsalgia, unspecified: Secondary | ICD-10-CM | POA: Insufficient documentation

## 2013-10-16 DIAGNOSIS — T8489XA Other specified complication of internal orthopedic prosthetic devices, implants and grafts, initial encounter: Principal | ICD-10-CM | POA: Insufficient documentation

## 2013-10-16 DIAGNOSIS — M216X9 Other acquired deformities of unspecified foot: Secondary | ICD-10-CM | POA: Insufficient documentation

## 2013-10-16 DIAGNOSIS — R0609 Other forms of dyspnea: Secondary | ICD-10-CM | POA: Insufficient documentation

## 2013-10-16 DIAGNOSIS — E669 Obesity, unspecified: Secondary | ICD-10-CM | POA: Insufficient documentation

## 2013-10-16 DIAGNOSIS — E785 Hyperlipidemia, unspecified: Secondary | ICD-10-CM | POA: Insufficient documentation

## 2013-10-16 DIAGNOSIS — E119 Type 2 diabetes mellitus without complications: Secondary | ICD-10-CM | POA: Insufficient documentation

## 2013-10-16 DIAGNOSIS — I428 Other cardiomyopathies: Secondary | ICD-10-CM | POA: Insufficient documentation

## 2013-10-16 DIAGNOSIS — I10 Essential (primary) hypertension: Secondary | ICD-10-CM | POA: Insufficient documentation

## 2013-10-16 DIAGNOSIS — T8484XA Pain due to internal orthopedic prosthetic devices, implants and grafts, initial encounter: Secondary | ICD-10-CM | POA: Diagnosis present

## 2013-10-16 DIAGNOSIS — Y838 Other surgical procedures as the cause of abnormal reaction of the patient, or of later complication, without mention of misadventure at the time of the procedure: Secondary | ICD-10-CM | POA: Insufficient documentation

## 2013-10-16 DIAGNOSIS — I509 Heart failure, unspecified: Secondary | ICD-10-CM | POA: Insufficient documentation

## 2013-10-16 DIAGNOSIS — Z7901 Long term (current) use of anticoagulants: Secondary | ICD-10-CM | POA: Insufficient documentation

## 2013-10-16 DIAGNOSIS — Z7982 Long term (current) use of aspirin: Secondary | ICD-10-CM | POA: Insufficient documentation

## 2013-10-16 DIAGNOSIS — R0989 Other specified symptoms and signs involving the circulatory and respiratory systems: Secondary | ICD-10-CM | POA: Insufficient documentation

## 2013-10-16 DIAGNOSIS — I4891 Unspecified atrial fibrillation: Secondary | ICD-10-CM | POA: Insufficient documentation

## 2013-10-16 HISTORY — PX: HARDWARE REMOVAL: SHX979

## 2013-10-16 LAB — GLUCOSE, CAPILLARY
Glucose-Capillary: 75 mg/dL (ref 70–99)
Glucose-Capillary: 58 mg/dL — ABNORMAL LOW (ref 70–99)
Glucose-Capillary: 95 mg/dL (ref 70–99)
Glucose-Capillary: 83 mg/dL (ref 70–99)
Glucose-Capillary: 91 mg/dL (ref 70–99)
Glucose-Capillary: 73 mg/dL (ref 70–99)
Glucose-Capillary: 140 mg/dL — ABNORMAL HIGH (ref 70–99)

## 2013-10-16 LAB — PROTIME-INR
INR: 1.1 (ref 0.00–1.49)
Prothrombin Time: 14 seconds (ref 11.6–15.2)

## 2013-10-16 LAB — APTT: APTT: 28 s (ref 24–37)

## 2013-10-16 SURGERY — REMOVAL, HARDWARE
Anesthesia: General | Site: Back

## 2013-10-16 MED ORDER — ONDANSETRON HCL 4 MG/2ML IJ SOLN
INTRAMUSCULAR | Status: AC
  Filled 2013-10-16: qty 2

## 2013-10-16 MED ORDER — PROPOFOL 10 MG/ML IV BOLUS
INTRAVENOUS | Status: DC | PRN
  Administered 2013-10-16: 180 mg via INTRAVENOUS

## 2013-10-16 MED ORDER — HYDROMORPHONE HCL PF 1 MG/ML IJ SOLN: 0.5000 mg | INTRAMUSCULAR | Status: DC | PRN

## 2013-10-16 MED ORDER — FENTANYL CITRATE 0.05 MG/ML IJ SOLN
INTRAMUSCULAR | Status: DC | PRN
  Administered 2013-10-16: 100 ug via INTRAVENOUS

## 2013-10-16 MED ORDER — ACETAMINOPHEN 325 MG PO TABS: 650.0000 mg | ORAL_TABLET | ORAL | Status: DC | PRN

## 2013-10-16 MED ORDER — INSULIN GLARGINE 100 UNIT/ML ~~LOC~~ SOLN
60.0000 [IU] | Freq: Every day | SUBCUTANEOUS | Status: DC
  Administered 2013-10-16: 50 [IU] via SUBCUTANEOUS
  Filled 2013-10-16 (×2): qty 0.6

## 2013-10-16 MED ORDER — GLYCOPYRROLATE 0.2 MG/ML IJ SOLN
INTRAMUSCULAR | Status: AC
  Filled 2013-10-16: qty 5

## 2013-10-16 MED ORDER — FENTANYL CITRATE 0.05 MG/ML IJ SOLN
INTRAMUSCULAR | Status: AC
  Filled 2013-10-16: qty 5

## 2013-10-16 MED ORDER — SODIUM CHLORIDE 0.9 % IV SOLN: 250.0000 mL | INTRAVENOUS | Status: DC

## 2013-10-16 MED ORDER — OXYCODONE HCL 5 MG/5ML PO SOLN: 5.0000 mg | Freq: Once | ORAL | Status: DC | PRN

## 2013-10-16 MED ORDER — NEOSTIGMINE METHYLSULFATE 1 MG/ML IJ SOLN
INTRAMUSCULAR | Status: DC | PRN
  Administered 2013-10-16: 5 mg via INTRAVENOUS

## 2013-10-16 MED ORDER — PHENOL 1.4 % MT LIQD: 1.0000 | OROMUCOSAL | Status: DC | PRN

## 2013-10-16 MED ORDER — FERROUS SULFATE 325 (65 FE) MG PO TABS
325.0000 mg | ORAL_TABLET | Freq: Every day | ORAL | Status: DC
  Administered 2013-10-17: 325 mg via ORAL
  Filled 2013-10-16 (×2): qty 1

## 2013-10-16 MED ORDER — PHENYLEPHRINE HCL 10 MG/ML IJ SOLN
INTRAMUSCULAR | Status: DC | PRN
  Administered 2013-10-16 (×2): 40 ug via INTRAVENOUS

## 2013-10-16 MED ORDER — HEPARIN SODIUM (PORCINE) 5000 UNIT/ML IJ SOLN
INTRAMUSCULAR | Status: AC
  Administered 2013-10-16: 5000 [IU]
  Filled 2013-10-16: qty 1

## 2013-10-16 MED ORDER — LACTATED RINGERS IV SOLN
INTRAVENOUS | Status: DC
  Administered 2013-10-16: 14:00:00 via INTRAVENOUS

## 2013-10-16 MED ORDER — HEMOSTATIC AGENTS (NO CHARGE) OPTIME
TOPICAL | Status: DC | PRN
Start: 2013-10-16 — End: 2013-10-16
  Administered 2013-10-16: 1 via TOPICAL

## 2013-10-16 MED ORDER — ONDANSETRON HCL 4 MG/2ML IJ SOLN
INTRAMUSCULAR | Status: DC | PRN
  Administered 2013-10-16: 4 mg via INTRAVENOUS

## 2013-10-16 MED ORDER — ENALAPRIL MALEATE 20 MG PO TABS
20.0000 mg | ORAL_TABLET | Freq: Every day | ORAL | Status: DC
  Administered 2013-10-17: 20 mg via ORAL
  Filled 2013-10-16: qty 1

## 2013-10-16 MED ORDER — SUCCINYLCHOLINE CHLORIDE 20 MG/ML IJ SOLN
INTRAMUSCULAR | Status: AC
  Filled 2013-10-16: qty 1

## 2013-10-16 MED ORDER — SOTALOL HCL 80 MG PO TABS
80.0000 mg | ORAL_TABLET | Freq: Two times a day (BID) | ORAL | Status: DC
  Administered 2013-10-16 – 2013-10-17 (×2): 80 mg via ORAL
  Filled 2013-10-16 (×4): qty 1

## 2013-10-16 MED ORDER — THROMBIN 5000 UNITS EX SOLR
CUTANEOUS | Status: DC | PRN
  Administered 2013-10-16 (×2): 5000 [IU] via TOPICAL

## 2013-10-16 MED ORDER — DOCUSATE SODIUM 100 MG PO CAPS
100.0000 mg | ORAL_CAPSULE | Freq: Two times a day (BID) | ORAL | Status: DC
  Administered 2013-10-16 – 2013-10-17 (×2): 100 mg via ORAL
  Filled 2013-10-16 (×3): qty 1

## 2013-10-16 MED ORDER — VANCOMYCIN HCL 10 G IV SOLR
1250.0000 mg | Freq: Two times a day (BID) | INTRAVENOUS | Status: DC
  Administered 2013-10-17: 1250 mg via INTRAVENOUS
  Filled 2013-10-16 (×2): qty 1250

## 2013-10-16 MED ORDER — NEOSTIGMINE METHYLSULFATE 10 MG/10ML IV SOLN
INTRAVENOUS | Status: AC
  Filled 2013-10-16: qty 1

## 2013-10-16 MED ORDER — INSULIN ASPART 100 UNIT/ML ~~LOC~~ SOLN: 0.0000 [IU] | Freq: Every day | SUBCUTANEOUS | Status: DC

## 2013-10-16 MED ORDER — CYCLOBENZAPRINE HCL 10 MG PO TABS: 10.0000 mg | ORAL_TABLET | Freq: Three times a day (TID) | ORAL | Status: DC | PRN

## 2013-10-16 MED ORDER — DEXTROSE 50 % IV SOLN
INTRAVENOUS | Status: AC
  Administered 2013-10-16: 25 mL
  Filled 2013-10-16: qty 50

## 2013-10-16 MED ORDER — DILTIAZEM HCL ER COATED BEADS 120 MG PO CP24
120.0000 mg | ORAL_CAPSULE | Freq: Every day | ORAL | Status: DC
  Administered 2013-10-17: 120 mg via ORAL
  Filled 2013-10-16: qty 1

## 2013-10-16 MED ORDER — PROPOFOL 10 MG/ML IV BOLUS
INTRAVENOUS | Status: AC
  Filled 2013-10-16: qty 20

## 2013-10-16 MED ORDER — FUROSEMIDE 40 MG PO TABS
40.0000 mg | ORAL_TABLET | Freq: Two times a day (BID) | ORAL | Status: DC
  Administered 2013-10-16 – 2013-10-17 (×2): 40 mg via ORAL
  Filled 2013-10-16 (×4): qty 1

## 2013-10-16 MED ORDER — PHENYLEPHRINE HCL 10 MG/ML IJ SOLN
INTRAMUSCULAR | Status: AC
  Filled 2013-10-16: qty 1

## 2013-10-16 MED ORDER — SODIUM CHLORIDE 0.9 % IJ SOLN: 3.0000 mL | INTRAMUSCULAR | Status: DC | PRN

## 2013-10-16 MED ORDER — PHENYLEPHRINE HCL 10 MG/ML IJ SOLN
20.0000 mg | INTRAMUSCULAR | Status: DC | PRN
  Administered 2013-10-16: 30 ug/min via INTRAVENOUS

## 2013-10-16 MED ORDER — OXYCODONE HCL 5 MG PO TABS: 5.0000 mg | ORAL_TABLET | Freq: Once | ORAL | Status: DC | PRN

## 2013-10-16 MED ORDER — ROPINIROLE HCL 1 MG PO TABS
2.0000 mg | ORAL_TABLET | Freq: Two times a day (BID) | ORAL | Status: DC
  Administered 2013-10-16 – 2013-10-17 (×2): 2 mg via ORAL
  Filled 2013-10-16 (×4): qty 2

## 2013-10-16 MED ORDER — DEXAMETHASONE SODIUM PHOSPHATE 10 MG/ML IJ SOLN
INTRAMUSCULAR | Status: AC
Start: 2013-10-16 — End: 2013-10-17
  Filled 2013-10-16: qty 1

## 2013-10-16 MED ORDER — SIMVASTATIN 10 MG PO TABS
10.0000 mg | ORAL_TABLET | Freq: Every day | ORAL | Status: DC
  Administered 2013-10-16: 10 mg via ORAL
  Filled 2013-10-16 (×3): qty 1

## 2013-10-16 MED ORDER — MECLIZINE HCL 25 MG PO TABS
25.0000 mg | ORAL_TABLET | Freq: Four times a day (QID) | ORAL | Status: DC
  Administered 2013-10-16 – 2013-10-17 (×2): 25 mg via ORAL
  Filled 2013-10-16 (×7): qty 1

## 2013-10-16 MED ORDER — INSULIN ASPART 100 UNIT/ML ~~LOC~~ SOLN
0.0000 [IU] | Freq: Three times a day (TID) | SUBCUTANEOUS | Status: DC
  Administered 2013-10-17: 4 [IU] via SUBCUTANEOUS

## 2013-10-16 MED ORDER — DEXAMETHASONE SODIUM PHOSPHATE 10 MG/ML IJ SOLN: 10.0000 mg | INTRAMUSCULAR | Status: DC

## 2013-10-16 MED ORDER — GLYCOPYRROLATE 0.2 MG/ML IJ SOLN
INTRAMUSCULAR | Status: AC
  Filled 2013-10-16: qty 2

## 2013-10-16 MED ORDER — VANCOMYCIN HCL 10 G IV SOLR
1500.0000 mg | INTRAVENOUS | Status: DC
  Filled 2013-10-16 (×2): qty 1500

## 2013-10-16 MED ORDER — ACETAMINOPHEN 650 MG RE SUPP: 650.0000 mg | RECTAL | Status: DC | PRN

## 2013-10-16 MED ORDER — WARFARIN - PHYSICIAN DOSING INPATIENT: Freq: Every day | Status: DC

## 2013-10-16 MED ORDER — LIDOCAINE-EPINEPHRINE 1 %-1:100000 IJ SOLN
INTRAMUSCULAR | Status: DC | PRN
  Administered 2013-10-16: 10 mL

## 2013-10-16 MED ORDER — BACITRACIN 50000 UNITS IM SOLR
INTRAMUSCULAR | Status: DC | PRN
  Administered 2013-10-16: 17:00:00

## 2013-10-16 MED ORDER — SODIUM CHLORIDE 0.9 % IJ SOLN: 3.0000 mL | Freq: Two times a day (BID) | INTRAMUSCULAR | Status: DC

## 2013-10-16 MED ORDER — HEPARIN SODIUM (PORCINE) 5000 UNIT/ML IJ SOLN: 5000.0000 [IU] | Freq: Once | INTRAMUSCULAR | Status: DC

## 2013-10-16 MED ORDER — ASPIRIN EC 81 MG PO TBEC
81.0000 mg | DELAYED_RELEASE_TABLET | Freq: Every day | ORAL | Status: DC
  Administered 2013-10-16 – 2013-10-17 (×2): 81 mg via ORAL
  Filled 2013-10-16 (×3): qty 1

## 2013-10-16 MED ORDER — DEXTROSE 5 % IV SOLN
3.0000 g | INTRAVENOUS | Status: AC
  Administered 2013-10-16: 3 g via INTRAVENOUS
  Filled 2013-10-16: qty 3000

## 2013-10-16 MED ORDER — 0.9 % SODIUM CHLORIDE (POUR BTL) OPTIME
TOPICAL | Status: DC | PRN
Start: 2013-10-16 — End: 2013-10-16
  Administered 2013-10-16: 1000 mL

## 2013-10-16 MED ORDER — MENTHOL 3 MG MT LOZG: 1.0000 | LOZENGE | OROMUCOSAL | Status: DC | PRN

## 2013-10-16 MED ORDER — INSULIN ASPART 100 UNIT/ML ~~LOC~~ SOLN
6.0000 [IU] | Freq: Three times a day (TID) | SUBCUTANEOUS | Status: DC
  Administered 2013-10-17: 6 [IU] via SUBCUTANEOUS

## 2013-10-16 MED ORDER — ROCURONIUM BROMIDE 50 MG/5ML IV SOLN
INTRAVENOUS | Status: AC
  Filled 2013-10-16: qty 1

## 2013-10-16 MED ORDER — GABAPENTIN 600 MG PO TABS
600.0000 mg | ORAL_TABLET | Freq: Three times a day (TID) | ORAL | Status: DC
  Administered 2013-10-16 – 2013-10-17 (×2): 600 mg via ORAL
  Filled 2013-10-16 (×6): qty 1

## 2013-10-16 MED ORDER — VANCOMYCIN HCL 1000 MG IV SOLR
1000.0000 mg | INTRAVENOUS | Status: DC | PRN
  Administered 2013-10-16: 1500 mg via INTRAVENOUS

## 2013-10-16 MED ORDER — HYDROMORPHONE HCL PF 1 MG/ML IJ SOLN: 0.2500 mg | INTRAMUSCULAR | Status: DC | PRN

## 2013-10-16 MED ORDER — TRAMADOL HCL 50 MG PO TABS: 100.0000 mg | ORAL_TABLET | Freq: Four times a day (QID) | ORAL | Status: DC | PRN

## 2013-10-16 MED ORDER — PHENYLEPHRINE 40 MCG/ML (10ML) SYRINGE FOR IV PUSH (FOR BLOOD PRESSURE SUPPORT)
PREFILLED_SYRINGE | INTRAVENOUS | Status: AC
  Filled 2013-10-16: qty 10

## 2013-10-16 MED ORDER — GLYCOPYRROLATE 0.2 MG/ML IJ SOLN
INTRAMUSCULAR | Status: DC | PRN
Start: 2013-10-16 — End: 2013-10-16
  Administered 2013-10-16: .8 mg via INTRAVENOUS

## 2013-10-16 MED ORDER — LIDOCAINE HCL (CARDIAC) 20 MG/ML IV SOLN
INTRAVENOUS | Status: DC | PRN
  Administered 2013-10-16: 70 mg via INTRAVENOUS

## 2013-10-16 MED ORDER — MIDAZOLAM HCL 2 MG/2ML IJ SOLN
INTRAMUSCULAR | Status: AC
  Filled 2013-10-16: qty 2

## 2013-10-16 MED ORDER — WARFARIN SODIUM 5 MG PO TABS
5.0000 mg | ORAL_TABLET | ORAL | Status: DC
  Administered 2013-10-16: 5 mg via ORAL
  Filled 2013-10-16 (×2): qty 1

## 2013-10-16 MED ORDER — LIDOCAINE HCL (CARDIAC) 20 MG/ML IV SOLN
INTRAVENOUS | Status: AC
  Filled 2013-10-16: qty 5

## 2013-10-16 MED ORDER — ONDANSETRON HCL 4 MG/2ML IJ SOLN: 4.0000 mg | INTRAMUSCULAR | Status: DC | PRN

## 2013-10-16 MED ORDER — PROMETHAZINE HCL 25 MG/ML IJ SOLN: 6.2500 mg | INTRAMUSCULAR | Status: DC | PRN

## 2013-10-16 MED ORDER — LACTATED RINGERS IV SOLN
INTRAVENOUS | Status: DC | PRN
  Administered 2013-10-16: 14:00:00 via INTRAVENOUS

## 2013-10-16 MED ORDER — WARFARIN SODIUM 7.5 MG PO TABS
7.5000 mg | ORAL_TABLET | ORAL | Status: DC
  Filled 2013-10-16: qty 1

## 2013-10-16 MED ORDER — ROCURONIUM BROMIDE 100 MG/10ML IV SOLN
INTRAVENOUS | Status: DC | PRN
  Administered 2013-10-16: 50 mg via INTRAVENOUS

## 2013-10-16 SURGICAL SUPPLY — 68 items
ADH SKN CLS APL DERMABOND .7 (GAUZE/BANDAGES/DRESSINGS)
ADH SKN CLS LQ APL DERMABOND (GAUZE/BANDAGES/DRESSINGS) ×1
APL SKNCLS STERI-STRIP NONHPOA (GAUZE/BANDAGES/DRESSINGS) ×1
BAG DECANTER FOR FLEXI CONT (MISCELLANEOUS) ×3 IMPLANT
BENZOIN TINCTURE PRP APPL 2/3 (GAUZE/BANDAGES/DRESSINGS) ×3 IMPLANT
BLADE 10 SAFETY STRL DISP (BLADE) ×4 IMPLANT
BLADE SURG ROTATE 9660 (MISCELLANEOUS) IMPLANT
BRUSH SCRUB EZ PLAIN DRY (MISCELLANEOUS) ×3 IMPLANT
BUR MATCHSTICK NEURO 3.0 LAGG (BURR) ×1 IMPLANT
BUR PRECISION FLUTE 6.0 (BURR) IMPLANT
CANISTER SUCT 3000ML (MISCELLANEOUS) IMPLANT
CLOSURE WOUND 1/2 X4 (GAUZE/BANDAGES/DRESSINGS) ×2
CONT SPEC 4OZ CLIKSEAL STRL BL (MISCELLANEOUS) ×5 IMPLANT
DECANTER SPIKE VIAL GLASS SM (MISCELLANEOUS) ×3 IMPLANT
DERMABOND ADHESIVE PROPEN (GAUZE/BANDAGES/DRESSINGS) ×2
DERMABOND ADVANCED (GAUZE/BANDAGES/DRESSINGS)
DERMABOND ADVANCED .7 DNX12 (GAUZE/BANDAGES/DRESSINGS) IMPLANT
DERMABOND ADVANCED .7 DNX6 (GAUZE/BANDAGES/DRESSINGS) IMPLANT
DRAPE LAPAROTOMY 100X72X124 (DRAPES) ×3 IMPLANT
DRAPE POUCH INSTRU U-SHP 10X18 (DRAPES) ×3 IMPLANT
DRAPE PROXIMA HALF (DRAPES) ×2 IMPLANT
DRAPE SURG 17X23 STRL (DRAPES) ×3 IMPLANT
DRSG OPSITE 4X5.5 SM (GAUZE/BANDAGES/DRESSINGS) ×3 IMPLANT
DRSG OPSITE POSTOP 4X6 (GAUZE/BANDAGES/DRESSINGS) ×2 IMPLANT
ELECT REM PT RETURN 9FT ADLT (ELECTROSURGICAL) ×3
ELECTRODE REM PT RTRN 9FT ADLT (ELECTROSURGICAL) ×1 IMPLANT
EVACUATOR 1/8 PVC DRAIN (DRAIN) ×2 IMPLANT
GAUZE SPONGE 4X4 16PLY XRAY LF (GAUZE/BANDAGES/DRESSINGS) IMPLANT
GLOVE BIO SURGEON STRL SZ8 (GLOVE) ×3 IMPLANT
GLOVE BIOGEL PI IND STRL 7.0 (GLOVE) ×1 IMPLANT
GLOVE BIOGEL PI IND STRL 7.5 (GLOVE) IMPLANT
GLOVE BIOGEL PI INDICATOR 7.0 (GLOVE) ×2
GLOVE BIOGEL PI INDICATOR 7.5 (GLOVE) ×4
GLOVE ECLIPSE 7.5 STRL STRAW (GLOVE) ×6 IMPLANT
GLOVE EXAM NITRILE LRG STRL (GLOVE) IMPLANT
GLOVE EXAM NITRILE MD LF STRL (GLOVE) IMPLANT
GLOVE EXAM NITRILE XL STR (GLOVE) IMPLANT
GLOVE EXAM NITRILE XS STR PU (GLOVE) IMPLANT
GLOVE INDICATOR 8.5 STRL (GLOVE) ×3 IMPLANT
GLOVE SURG SS PI 6.5 STRL IVOR (GLOVE) ×2 IMPLANT
GOWN BRE IMP SLV AUR LG STRL (GOWN DISPOSABLE) ×1 IMPLANT
GOWN BRE IMP SLV AUR XL STRL (GOWN DISPOSABLE) ×2 IMPLANT
GOWN STRL REIN 2XL LVL4 (GOWN DISPOSABLE) IMPLANT
GOWN STRL REUS W/ TWL LRG LVL3 (GOWN DISPOSABLE) IMPLANT
GOWN STRL REUS W/ TWL XL LVL3 (GOWN DISPOSABLE) IMPLANT
GOWN STRL REUS W/TWL 2XL LVL3 (GOWN DISPOSABLE) ×2 IMPLANT
GOWN STRL REUS W/TWL LRG LVL3 (GOWN DISPOSABLE) ×3
GOWN STRL REUS W/TWL XL LVL3 (GOWN DISPOSABLE) ×9
KIT BASIN OR (CUSTOM PROCEDURE TRAY) ×3 IMPLANT
KIT ROOM TURNOVER OR (KITS) ×3 IMPLANT
NDL SPNL 22GX3.5 QUINCKE BK (NEEDLE) ×1 IMPLANT
NEEDLE HYPO 22GX1.5 SAFETY (NEEDLE) ×3 IMPLANT
NEEDLE SPNL 22GX3.5 QUINCKE BK (NEEDLE) ×3 IMPLANT
NS IRRIG 1000ML POUR BTL (IV SOLUTION) ×3 IMPLANT
PACK LAMINECTOMY NEURO (CUSTOM PROCEDURE TRAY) ×3 IMPLANT
RASP 3.0MM (RASP) ×3 IMPLANT
SPONGE GAUZE 4X4 12PLY (GAUZE/BANDAGES/DRESSINGS) ×3 IMPLANT
SPONGE SURGIFOAM ABS GEL SZ50 (HEMOSTASIS) ×3 IMPLANT
STRIP CLOSURE SKIN 1/2X4 (GAUZE/BANDAGES/DRESSINGS) ×3 IMPLANT
SUT BONE WAX W31G (SUTURE) ×2 IMPLANT
SUT VIC AB 0 CT1 18XCR BRD8 (SUTURE) ×1 IMPLANT
SUT VIC AB 0 CT1 8-18 (SUTURE) ×3
SUT VIC AB 2-0 CT1 18 (SUTURE) ×3 IMPLANT
SUT VICRYL 4-0 PS2 18IN ABS (SUTURE) ×3 IMPLANT
SYR 20ML ECCENTRIC (SYRINGE) ×3 IMPLANT
TOWEL OR 17X24 6PK STRL BLUE (TOWEL DISPOSABLE) ×3 IMPLANT
TOWEL OR 17X26 10 PK STRL BLUE (TOWEL DISPOSABLE) ×3 IMPLANT
WATER STERILE IRR 1000ML POUR (IV SOLUTION) ×3 IMPLANT

## 2013-10-16 NOTE — Anesthesia Procedure Notes (Signed)
Procedure Name: Intubation Date/Time: 10/16/2013 4:36 PM Performed by: Eligha Bridegroom Pre-anesthesia Checklist: Patient identified, Timeout performed, Emergency Drugs available, Suction available and Patient being monitored Patient Re-evaluated:Patient Re-evaluated prior to inductionOxygen Delivery Method: Circle system utilized Preoxygenation: Pre-oxygenation with 100% oxygen Intubation Type: IV induction Ventilation: Mask ventilation without difficulty Laryngoscope Size: Mac and 4 Grade View: Grade I Tube type: Oral Tube size: 7.5 mm Number of attempts: 1 Airway Equipment and Method: Stylet Placement Confirmation: ETT inserted through vocal cords under direct vision,  breath sounds checked- equal and bilateral and positive ETCO2 Secured at: 23 cm Tube secured with: Tape Dental Injury: Teeth and Oropharynx as per pre-operative assessment

## 2013-10-16 NOTE — Anesthesia Preprocedure Evaluation (Signed)
Anesthesia Evaluation  Patient identified by MRN, date of birth, ID band Patient awake    Reviewed: Allergy & Precautions, H&P , NPO status , Patient's Chart, lab work & pertinent test results  Airway Mallampati: I TM Distance: >3 FB Neck ROM: Full    Dental   Pulmonary  breath sounds clear to auscultation        Cardiovascular hypertension, Pt. on medications +CHF + dysrhythmias Atrial Fibrillation Rhythm:Regular Rate:Normal     Neuro/Psych    GI/Hepatic   Endo/Other  diabetes, Well Controlled, Type 2, Insulin Dependent  Renal/GU      Musculoskeletal   Abdominal   Peds  Hematology   Anesthesia Other Findings   Reproductive/Obstetrics                           Anesthesia Physical Anesthesia Plan  ASA: III  Anesthesia Plan: General   Post-op Pain Management:    Induction: Intravenous  Airway Management Planned: Oral ETT  Additional Equipment:   Intra-op Plan:   Post-operative Plan: Extubation in OR  Informed Consent: I have reviewed the patients History and Physical, chart, labs and discussed the procedure including the risks, benefits and alternatives for the proposed anesthesia with the patient or authorized representative who has indicated his/her understanding and acceptance.   Dental advisory given  Plan Discussed with:   Anesthesia Plan Comments:         Anesthesia Quick Evaluation

## 2013-10-16 NOTE — Op Note (Signed)
Preoperative diagnosis: Painful hardware  Postoperative diagnosis: Same  Procedure: Reexploration of fusion and removal of bilateral iliac screws and right L5 pedicle screw  Surgeon: Dominica Severin Denna Fryberger  Anesthesia: Gen.  EBL: Minimal  History of present illness: Patient is a very pleasant 42 a gentleman who presented on a pseudarthrosis operation L5-S1 with placement of bilateral iliac screws. However in the relatively acute postoperative. Patient experienced recurrence of his back pain workup revealed lysis and loosening of his iliac screws. Patient was having severe pain whenever he was up and a milling of is all back pain it was not radicular in nature. 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 And due to the fact he now failed 2 L5-S1 fusion operations and he had no additional sacral options and failed iliac screws I recommended removal of hardware without replacement. X-rays were reviewed the risks and benefits of the operation with patient as well as perioperative course and potential outcome and alternatives surgery and he understood and agreed to proceed forward. Patient brought into the or was induced a general anesthesia positioned prone the Wilson frame his back was prepped and draped in routine sterile fashion the inferior aspect of his lower lumbar incision was opened up after infiltration of 10 cc lidocaine with epi the ileus was immediate identified and dissected the scar tissue identify the L5 screw in the right identify the rod the left I then used a titanium cutting drill dura but cut the rod inferior the L5 screw there was extensive amount of bony overgrowth on just inferior aspect of the L5 screw on the patient's left side as well as an extensive amount of heterotopic bone formation overlying the iliac prescription however after cutting the rod the rod piece was easily removed and she came out  and was completely loose. Then a similar fashion I removed the L5 and iliac screws on the patient's right side and I copiously irrigated and placed a drain and closed with interrupted Vicryl. Patient was wounds dressed it with recovered in stable condition at the end of case on it counts sponge counts were correct.

## 2013-10-16 NOTE — Progress Notes (Signed)
ANTIBIOTIC CONSULT NOTE - INITIAL  Pharmacy Consult for Vancomycin Indication: Post-surgical prophylaxis  Allergies  Allergen Reactions  . Elavil [Amitriptyline] Other (See Comments)    Passes out    Patient Measurements: Height: 5\' 10"  (177.8 cm) IBW/kg (Calculated) : 73 Wt 133kg Adjusted Body Weight:   Vital Signs: Temp: 97.8 F (36.6 C) (04/29 2006) Temp src: Oral (04/29 1316) BP: 133/63 mmHg (04/29 2006) Pulse Rate: 84 (04/29 2006) Intake/Output from previous day:   Intake/Output from this shift:    Labs: No results found for this basename: WBC, HGB, PLT, LABCREA, CREATININE,  in the last 72 hours The CrCl is unknown because both a height and weight (above a minimum accepted value) are required for this calculation. No results found for this basename: VANCOTROUGH, Corlis Leak, VANCORANDOM, GENTTROUGH, GENTPEAK, GENTRANDOM, TOBRATROUGH, TOBRAPEAK, TOBRARND, AMIKACINPEAK, AMIKACINTROU, AMIKACIN,  in the last 72 hours   Microbiology: Recent Results (from the past 720 hour(s))  SURGICAL PCR SCREEN     Status: None   Collection Time    10/11/13  8:37 AM      Result Value Ref Range Status   MRSA, PCR NEGATIVE  NEGATIVE Final   Staphylococcus aureus NEGATIVE  NEGATIVE Final   Comment:            The Xpert SA Assay (FDA     approved for NASAL specimens     in patients over 32 years of age),     is one component of     a comprehensive surveillance     program.  Test performance has     been validated by [employer] for patients greater     than or equal to 7 year old.     It is not intended     to diagnose infection nor to     guide or monitor treatment.    Medical History: Past Medical History  Diagnosis Date  . Hypertension   . Diabetes mellitus without complication   . CHF (congestive heart failure)   . Arthritis   . Dysrhythmia     a fib  . Left foot drop     wears brace  . Hyperlipidemia   . History of snoring     pt had sleep study but does not  have sleep apnea    Medications:  Prescriptions prior to admission  Medication Sig Dispense Refill  . APPLE CIDER VINEGAR PO Take 200 mg by mouth 2 (two) times daily.      Marland Kitchen aspirin EC 81 MG tablet Take 81 mg by mouth daily.      Marland Kitchen diltiazem (CARDIZEM CD) 120 MG 24 hr capsule Take 120 mg by mouth daily.      . enalapril (VASOTEC) 20 MG tablet Take 20 mg by mouth daily.       . ferrous sulfate 325 (65 FE) MG tablet Take 325 mg by mouth daily with breakfast.      . furosemide (LASIX) 40 MG tablet Take 40 mg by mouth 2 (two) times daily.      Marland Kitchen gabapentin (NEURONTIN) 600 MG tablet Take 600 mg by mouth 4 (four) times daily - after meals and at bedtime.       . insulin glargine (LANTUS) 100 UNIT/ML injection Inject 60 Units into the skin at bedtime.      . insulin regular (NOVOLIN R,HUMULIN R) 100 units/mL injection Inject 30 Units into the skin 3 (three) times daily before meals.      Marland Kitchen  meclizine (ANTIVERT) 25 MG tablet Take 25 mg by mouth 4 (four) times daily.       . Omega-3 Fatty Acids (FISH OIL) 1200 MG CAPS Take 1 capsule by mouth 2 (two) times daily.      Marland Kitchen OVER THE COUNTER MEDICATION Take 2-3 tablets by mouth daily. Equate brand stool softener.  Take 2 tablets one day and 3 tablets the next day.      Marland Kitchen rOPINIRole (REQUIP) 2 MG tablet Take 2 mg by mouth 2 (two) times daily.      . simvastatin (ZOCOR) 10 MG tablet Take 10 mg by mouth at bedtime.      . sotalol (BETAPACE) 80 MG tablet Take 80 mg by mouth 2 (two) times daily.      . traMADol (ULTRAM) 50 MG tablet Take 100 mg by mouth every 6 (six) hours as needed for pain.       Marland Kitchen warfarin (COUMADIN) 5 MG tablet Take 5-7.5 mg by mouth daily. Take 7.5mg  tablet on Tues., Thurs, and Sat. Take 5mg  tablet all other days.       Assessment: 73yom s/p spinal hardware removal to receive Vancomycin for post-surgical prophylaxis. Per OR notes, MD placed drain so Vancomycin will be continued per protocol until drain is removed or MD discontinues. Patient  received Vancomycin 1.5g in OR ~1640. - Wt 133kg - SCr 0.63, CrCl >60 ml/min  Goal of Therapy:  Vancomycin trough level 10-15 mcg/ml  Plan:  1. Vancomycin 1.25g IV q12h - continue until drain is removed or MD discontinues 2. Monitor renal function, cultures, clinical course and order Vancomycin trough at SS 3. MD restarted patient's Coumadin - will order daily INR x 3 per protocol  Patsey Berthold [facility] [phone] 10/16/2013,8:13 PM

## 2013-10-16 NOTE — Transfer of Care (Signed)
Immediate Anesthesia Transfer of Care Note  [name]  Procedure(s) Performed: Procedure(s): Lumbar Hardware Removal (N/A)  Patient Location: PACU  Anesthesia Type:General  Level of Consciousness: awake, alert  and oriented  Airway & Oxygen Therapy: Patient Spontanous Breathing and Patient connected to nasal cannula oxygen  Post-op Assessment: Report given to PACU RN and Post -op Vital signs reviewed and stable  Post vital signs: Reviewed and stable  Complications: No apparent anesthesia complications

## 2013-10-16 NOTE — H&P (Signed)
Phillip Meyer is an 73 y.o. male.   Chief Complaint: Back and right leg pain HPI: Patient is a 73 year old some is a multiple operations in his back underwent a pseudarthrosis revision with placement of iliac screws several months ago and developed progressive worsening back pain workup revealed loosening of his iliac screws and loosening of his L5 screws and fell at these were toggling whenever he was standing up and walking and causing his pain due to the fact there was no other good options for screw placement we have recommended hardware removal only. Have extensively gone over the risks and benefits of the operation with her as well as perioperative course expectations of outcome and alternatives surgery and he understands and agrees to proceed forward.  Past Medical History  Diagnosis Date  . Hypertension   . Diabetes mellitus without complication   . CHF (congestive heart failure)   . Arthritis   . Dysrhythmia     a fib  . Left foot drop     wears brace  . Hyperlipidemia   . History of snoring     pt had sleep study but does not have sleep apnea    Past Surgical History  Procedure Laterality Date  . Back surgery      x6  . Cardiac catheterization      [facility]  . Laminectomy with posterior lateral arthrodesis level 1 N/A 03/27/2013    Procedure: REEXPLORATION OF LUMBAR FUSION WITH REPLACEMENT OF SACRAL SCREWS AND ILIAC CREST BONE GRAFT REDO DECOMPRESSION LAMINECTOMY LUMBAR FIVE SACRAL ONE;  Surgeon: Elaina Hoops, MD;  Location: [facility];  Service: Neurosurgery;  Laterality: N/A;    No family history on file. Social History:  reports that he has never smoked. He does not have any smokeless tobacco history on file. He reports that he does not drink alcohol or use illicit drugs.  Allergies:  Allergies  Allergen Reactions  . Elavil [Amitriptyline] Other (See Comments)    Passes out    Medications Prior to Admission  Medication Sig Dispense Refill  . APPLE CIDER VINEGAR  PO Take 200 mg by mouth 2 (two) times daily.      Marland Kitchen aspirin EC 81 MG tablet Take 81 mg by mouth daily.      Marland Kitchen diltiazem (CARDIZEM CD) 120 MG 24 hr capsule Take 120 mg by mouth daily.      . enalapril (VASOTEC) 20 MG tablet Take 20 mg by mouth daily.       . ferrous sulfate 325 (65 FE) MG tablet Take 325 mg by mouth daily with breakfast.      . furosemide (LASIX) 40 MG tablet Take 40 mg by mouth 2 (two) times daily.      Marland Kitchen gabapentin (NEURONTIN) 600 MG tablet Take 600 mg by mouth 4 (four) times daily - after meals and at bedtime.       . insulin glargine (LANTUS) 100 UNIT/ML injection Inject 60 Units into the skin at bedtime.      . insulin regular (NOVOLIN R,HUMULIN R) 100 units/mL injection Inject 30 Units into the skin 3 (three) times daily before meals.      . meclizine (ANTIVERT) 25 MG tablet Take 25 mg by mouth 4 (four) times daily.       . Omega-3 Fatty Acids (FISH OIL) 1200 MG CAPS Take 1 capsule by mouth 2 (two) times daily.      Marland Kitchen OVER THE COUNTER MEDICATION Take 2-3 tablets by mouth daily. Equate  brand stool softener.  Take 2 tablets one day and 3 tablets the next day.      Marland Kitchen rOPINIRole (REQUIP) 2 MG tablet Take 2 mg by mouth 2 (two) times daily.      . simvastatin (ZOCOR) 10 MG tablet Take 10 mg by mouth at bedtime.      . sotalol (BETAPACE) 80 MG tablet Take 80 mg by mouth 2 (two) times daily.      . traMADol (ULTRAM) 50 MG tablet Take 100 mg by mouth every 6 (six) hours as needed for pain.       Marland Kitchen warfarin (COUMADIN) 5 MG tablet Take 5-7.5 mg by mouth daily. Take 7.5mg  tablet on Tues., Thurs, and Sat. Take 5mg  tablet all other days.        Results for orders placed during the hospital encounter of 10/16/13 (from the past 48 hour(s))  PROTIME-INR     Status: None   Collection Time    10/16/13  1:18 PM      Result Value Ref Range   Prothrombin Time 14.0  11.6 - 15.2 seconds   INR 1.10  0.00 - 1.49  APTT     Status: None   Collection Time    10/16/13  1:18 PM      Result Value  Ref Range   aPTT 28  24 - 37 seconds  GLUCOSE, CAPILLARY     Status: None   Collection Time    10/16/13  1:19 PM      Result Value Ref Range   Glucose-Capillary 75  70 - 99 mg/dL  GLUCOSE, CAPILLARY     Status: Abnormal   Collection Time    10/16/13  2:28 PM      Result Value Ref Range   Glucose-Capillary 58 (*) 70 - 99 mg/dL  GLUCOSE, CAPILLARY     Status: None   Collection Time    10/16/13  3:09 PM      Result Value Ref Range   Glucose-Capillary 95  70 - 99 mg/dL   Comment 1 Documented in Chart     No results found.  Review of Systems  Constitutional: Negative.   HENT: Negative.   Eyes: Negative.   Respiratory: Negative.   Cardiovascular: Negative.   Gastrointestinal: Negative.   Genitourinary: Negative.   Musculoskeletal: Positive for back pain and myalgias.  Skin: Negative.   Neurological: Positive for sensory change.  Endo/Heme/Allergies: Negative.   Psychiatric/Behavioral: Negative.     Blood pressure 120/57, pulse 88, temperature 97.6 F (36.4 C), temperature source Oral, height 5\' 10"  (1.778 m), SpO2 100.00%. Physical Exam  Constitutional: He is oriented to person, place, and time. He appears well-developed.  HENT:  Head: Normocephalic.  Eyes: Pupils are equal, round, and reactive to light.  Neck: Normal range of motion.  Respiratory: Effort normal and breath sounds normal.  GI: Soft. Bowel sounds are normal.  Musculoskeletal: Normal range of motion.  Neurological: He is alert and oriented to person, place, and time. GCS eye subscore is 4. GCS verbal subscore is 5. GCS motor subscore is 6.  Reflex Scores:      Patellar reflexes are 0 on the right side and 0 on the left side.      Achilles reflexes are 0 on the right side and 0 on the left side. Strength the right lower extremity 5 out of 5 iliopsoas, quads, hip she's, gastrocs him into tibialis, and EHL. Left lower extremity has a baseline left foot drop  Assessment/Plan 73 year old gentleman and  presents for hardware removal L5-S1  Elaina Hoops 10/16/2013, 4:16 PM

## 2013-10-17 LAB — GLUCOSE, CAPILLARY: GLUCOSE-CAPILLARY: 172 mg/dL — AB (ref 70–99)

## 2013-10-17 LAB — PROTIME-INR
INR: 1.04 (ref 0.00–1.49)
PROTHROMBIN TIME: 13.4 s (ref 11.6–15.2)

## 2013-10-17 NOTE — Progress Notes (Signed)
Pt. Alert and oriented,follows simple instructions, denies pain. Incision area without swelling, redness or S/S of infection. Voiding adequate clear yellow urine. Moving all extremities well and vitals stable and documented. Patient discharged home with family.  Lumbar surgery notes instructions given to patient and family member for home safety and precautions. Pt. and family stated understanding of instructions given 

## 2013-10-17 NOTE — Discharge Instructions (Signed)

## 2013-10-17 NOTE — Discharge Summary (Signed)
Physician Discharge Summary  Patient ID: Phillip Meyer MRN: [id number] DOB/AGE: 1941/05/22 72 y.o.  Admit date: 10/16/2013 Discharge date: 10/17/2013  Admission Diagnoses: Painful hardware  Discharge Diagnoses: Same Active Problems:   Painful orthopaedic hardware   Discharged Condition: good  Hospital Course: Patient was admitted underwent exploration of. first of fusion removal of hardware postoperative are well recovered in the floor on the floor was angling and voiding spontaneously tolerating regular diet was stable for discharge  Consults: Significant Diagnostic Studies: Treatments: Removal of hardware Discharge Exam: Blood pressure 120/74, pulse 89, temperature 98.4 F (36.9 C), temperature source Oral, resp. rate 18, height 5\' 10"  (1.778 m), SpO2 94.00%. Neurologically baseline  Disposition: Home     Medication List         APPLE CIDER VINEGAR PO  Take 200 mg by mouth 2 (two) times daily.     aspirin EC 81 MG tablet  Take 81 mg by mouth daily.     diltiazem 120 MG 24 hr capsule  Commonly known as:  CARDIZEM CD  Take 120 mg by mouth daily.     enalapril 20 MG tablet  Commonly known as:  VASOTEC  Take 20 mg by mouth daily.     ferrous sulfate 325 (65 FE) MG tablet  Take 325 mg by mouth daily with breakfast.     Fish Oil 1200 MG Caps  Take 1 capsule by mouth 2 (two) times daily.     furosemide 40 MG tablet  Commonly known as:  LASIX  Take 40 mg by mouth 2 (two) times daily.     gabapentin 600 MG tablet  Commonly known as:  NEURONTIN  Take 600 mg by mouth 4 (four) times daily - after meals and at bedtime.     insulin glargine 100 UNIT/ML injection  Commonly known as:  LANTUS  Inject 60 Units into the skin at bedtime.     insulin regular 100 units/mL injection  Commonly known as:  NOVOLIN R,HUMULIN R  Inject 30 Units into the skin 3 (three) times daily before meals.     meclizine 25 MG tablet  Commonly known as:  ANTIVERT  Take 25 mg by  mouth 4 (four) times daily.     OVER THE COUNTER MEDICATION  Take 2-3 tablets by mouth daily. Equate brand stool softener.  Take 2 tablets one day and 3 tablets the next day.     rOPINIRole 2 MG tablet  Commonly known as:  REQUIP  Take 2 mg by mouth 2 (two) times daily.     simvastatin 10 MG tablet  Commonly known as:  ZOCOR  Take 10 mg by mouth at bedtime.     sotalol 80 MG tablet  Commonly known as:  BETAPACE  Take 80 mg by mouth 2 (two) times daily.     traMADol 50 MG tablet  Commonly known as:  ULTRAM  Take 100 mg by mouth every 6 (six) hours as needed for pain.     warfarin 5 MG tablet  Commonly known as:  COUMADIN  Take 5-7.5 mg by mouth daily. Take 7.5mg  tablet on Tues., Thurs, and Sat. Take 5mg  tablet all other days.           Follow-up Information   Follow up with [facility], MD.   Specialty:  Neurosurgery   Contact information:   [address] [address] [phone]       Signed: Elaina Hoops 10/17/2013, 10:30 AM

## 2013-10-17 NOTE — Progress Notes (Signed)
Patient ID: Phillip Meyer, male   DOB: 04/03/1941, 73 y.o.   MRN: [id number] Doing great discharge home

## 2013-10-17 NOTE — Anesthesia Postprocedure Evaluation (Signed)
  Anesthesia Post-op Note  [name]  Procedure(s) Performed: Procedure(s): Lumbar Hardware Removal (N/A)  Patient Location: PACU  Anesthesia Type:General  Level of Consciousness: awake  Airway and Oxygen Therapy: Patient Spontanous Breathing  Post-op Pain: mild  Post-op Assessment: Post-op Vital signs reviewed  Post-op Vital Signs: Reviewed  Last Vitals:  Filed Vitals:   10/17/13 0827  BP: 120/74  Pulse: 89  Temp: 36.9 C  Resp: 18    Complications: No apparent anesthesia complications

## 2013-10-18 ENCOUNTER — Encounter (HOSPITAL_COMMUNITY): Payer: Self-pay | Admitting: Neurosurgery

## 2013-12-26 ENCOUNTER — Other Ambulatory Visit: Payer: Self-pay | Admitting: Neurosurgery

## 2013-12-26 DIAGNOSIS — IMO0002 Reserved for concepts with insufficient information to code with codable children: Secondary | ICD-10-CM

## 2014-02-18 ENCOUNTER — Other Ambulatory Visit: Payer: Medicare Other

## 2014-02-18 ENCOUNTER — Ambulatory Visit
Admission: RE | Admit: 2014-02-18 | Discharge: 2014-02-18 | Disposition: A | Discharge status: Home / Self Care | Payer: Medicare Other | Source: Ambulatory Visit | Attending: Neurosurgery | Admitting: Neurosurgery

## 2014-02-18 DIAGNOSIS — IMO0002 Reserved for concepts with insufficient information to code with codable children: Secondary | ICD-10-CM

## 2014-02-27 ENCOUNTER — Other Ambulatory Visit (HOSPITAL_COMMUNITY): Payer: Self-pay | Admitting: Neurosurgery

## 2014-02-27 ENCOUNTER — Ambulatory Visit (HOSPITAL_COMMUNITY)
Admission: RE | Admit: 2014-02-27 | Discharge: 2014-02-27 | Disposition: A | Discharge status: Home / Self Care | Payer: Medicare Other | Source: Ambulatory Visit | Attending: Vascular Surgery | Admitting: Vascular Surgery

## 2014-02-27 DIAGNOSIS — M7989 Other specified soft tissue disorders: Secondary | ICD-10-CM | POA: Diagnosis not present

## 2014-02-27 DIAGNOSIS — I739 Peripheral vascular disease, unspecified: Secondary | ICD-10-CM | POA: Insufficient documentation

## 2014-02-27 DIAGNOSIS — R0989 Other specified symptoms and signs involving the circulatory and respiratory systems: Secondary | ICD-10-CM

## 2014-02-27 DIAGNOSIS — M79609 Pain in unspecified limb: Secondary | ICD-10-CM | POA: Diagnosis not present

## 2014-05-12 ENCOUNTER — Encounter (HOSPITAL_COMMUNITY): Payer: Self-pay | Admitting: Family Medicine

## 2014-05-12 ENCOUNTER — Inpatient Hospital Stay (HOSPITAL_COMMUNITY)
Admission: EM | Admit: 2014-05-12 | Discharge: 2014-05-14 | DRG: 300 | Disposition: A | Discharge status: Home / Self Care | Payer: Medicare Other | Attending: Internal Medicine | Admitting: Internal Medicine

## 2014-05-12 DIAGNOSIS — E785 Hyperlipidemia, unspecified: Secondary | ICD-10-CM | POA: Diagnosis present

## 2014-05-12 DIAGNOSIS — M1990 Unspecified osteoarthritis, unspecified site: Secondary | ICD-10-CM | POA: Diagnosis present

## 2014-05-12 DIAGNOSIS — M21372 Foot drop, left foot: Secondary | ICD-10-CM | POA: Diagnosis present

## 2014-05-12 DIAGNOSIS — I10 Essential (primary) hypertension: Secondary | ICD-10-CM | POA: Diagnosis present

## 2014-05-12 DIAGNOSIS — L0390 Cellulitis, unspecified: Secondary | ICD-10-CM | POA: Diagnosis present

## 2014-05-12 DIAGNOSIS — E669 Obesity, unspecified: Secondary | ICD-10-CM | POA: Diagnosis present

## 2014-05-12 DIAGNOSIS — E876 Hypokalemia: Secondary | ICD-10-CM | POA: Diagnosis not present

## 2014-05-12 DIAGNOSIS — I4891 Unspecified atrial fibrillation: Secondary | ICD-10-CM | POA: Diagnosis present

## 2014-05-12 DIAGNOSIS — I82401 Acute embolism and thrombosis of unspecified deep veins of right lower extremity: Secondary | ICD-10-CM

## 2014-05-12 DIAGNOSIS — L03115 Cellulitis of right lower limb: Secondary | ICD-10-CM

## 2014-05-12 DIAGNOSIS — E119 Type 2 diabetes mellitus without complications: Secondary | ICD-10-CM | POA: Diagnosis present

## 2014-05-12 DIAGNOSIS — Z7901 Long term (current) use of anticoagulants: Secondary | ICD-10-CM | POA: Diagnosis not present

## 2014-05-12 DIAGNOSIS — I509 Heart failure, unspecified: Secondary | ICD-10-CM | POA: Diagnosis present

## 2014-05-12 DIAGNOSIS — Z7982 Long term (current) use of aspirin: Secondary | ICD-10-CM | POA: Diagnosis not present

## 2014-05-12 DIAGNOSIS — Z888 Allergy status to other drugs, medicaments and biological substances status: Secondary | ICD-10-CM

## 2014-05-12 DIAGNOSIS — Z794 Long term (current) use of insulin: Secondary | ICD-10-CM | POA: Diagnosis not present

## 2014-05-12 DIAGNOSIS — Z6839 Body mass index (BMI) 39.0-39.9, adult: Secondary | ICD-10-CM

## 2014-05-12 DIAGNOSIS — IMO0001 Reserved for inherently not codable concepts without codable children: Secondary | ICD-10-CM

## 2014-05-12 DIAGNOSIS — I82409 Acute embolism and thrombosis of unspecified deep veins of unspecified lower extremity: Secondary | ICD-10-CM | POA: Diagnosis present

## 2014-05-12 LAB — CBC WITH DIFFERENTIAL/PLATELET
BASOS ABS: 0 10*3/uL (ref 0.0–0.1)
Basophils Relative: 0 % (ref 0–1)
EOS PCT: 2 % (ref 0–5)
Eosinophils Absolute: 0.2 10*3/uL (ref 0.0–0.7)
HEMATOCRIT: 41.5 % (ref 39.0–52.0)
HEMOGLOBIN: 14 g/dL (ref 13.0–17.0)
LYMPHS ABS: 2.1 10*3/uL (ref 0.7–4.0)
Lymphocytes Relative: 19 % (ref 12–46)
MCH: 32.1 pg (ref 26.0–34.0)
MCHC: 33.7 g/dL (ref 30.0–36.0)
MCV: 95.2 fL (ref 78.0–100.0)
MONO ABS: 1 10*3/uL (ref 0.1–1.0)
MONOS PCT: 9 % (ref 3–12)
Neutro Abs: 8 10*3/uL — ABNORMAL HIGH (ref 1.7–7.7)
Neutrophils Relative %: 70 % (ref 43–77)
Platelets: 175 10*3/uL (ref 150–400)
RBC: 4.36 MIL/uL (ref 4.22–5.81)
RDW: 13 % (ref 11.5–15.5)
WBC: 11.4 10*3/uL — AB (ref 4.0–10.5)

## 2014-05-12 LAB — COMPREHENSIVE METABOLIC PANEL
ALT: 16 U/L (ref 0–53)
AST: 28 U/L (ref 0–37)
Albumin: 3.5 g/dL (ref 3.5–5.2)
Alkaline Phosphatase: 118 U/L — ABNORMAL HIGH (ref 39–117)
Anion gap: 12 (ref 5–15)
BUN: 19 mg/dL (ref 6–23)
CALCIUM: 9.2 mg/dL (ref 8.4–10.5)
CHLORIDE: 98 meq/L (ref 96–112)
CO2: 30 meq/L (ref 19–32)
CREATININE: 0.61 mg/dL (ref 0.50–1.35)
GLUCOSE: 145 mg/dL — AB (ref 70–99)
Potassium: 3.8 mEq/L (ref 3.7–5.3)
Sodium: 140 mEq/L (ref 137–147)
Total Bilirubin: 0.6 mg/dL (ref 0.3–1.2)
Total Protein: 7.6 g/dL (ref 6.0–8.3)

## 2014-05-12 LAB — PROTIME-INR
INR: 1.17 (ref 0.00–1.49)
PROTHROMBIN TIME: 15 s (ref 11.6–15.2)

## 2014-05-12 LAB — LACTIC ACID, PLASMA: Lactic Acid, Venous: 1.4 mmol/L (ref 0.5–2.2)

## 2014-05-12 MED ORDER — TRAMADOL HCL 50 MG PO TABS
100.0000 mg | ORAL_TABLET | Freq: Four times a day (QID) | ORAL | Status: DC | PRN
  Administered 2014-05-12 – 2014-05-13 (×3): 100 mg via ORAL
  Filled 2014-05-12 (×3): qty 2

## 2014-05-12 MED ORDER — HEPARIN BOLUS VIA INFUSION
4000.0000 [IU] | Freq: Once | INTRAVENOUS | Status: AC
  Administered 2014-05-12: 4000 [IU] via INTRAVENOUS
  Filled 2014-05-12: qty 4000

## 2014-05-12 MED ORDER — ENALAPRIL MALEATE 20 MG PO TABS
20.0000 mg | ORAL_TABLET | Freq: Every day | ORAL | Status: DC
  Administered 2014-05-12: 20 mg via ORAL
  Filled 2014-05-12 (×2): qty 1

## 2014-05-12 MED ORDER — ONDANSETRON HCL 4 MG/2ML IJ SOLN
4.0000 mg | Freq: Once | INTRAMUSCULAR | Status: AC
  Administered 2014-05-12: 4 mg via INTRAVENOUS
  Filled 2014-05-12: qty 2

## 2014-05-12 MED ORDER — WARFARIN VIDEO: 1.0000 | Freq: Once | Status: DC

## 2014-05-12 MED ORDER — FUROSEMIDE 40 MG PO TABS
40.0000 mg | ORAL_TABLET | Freq: Two times a day (BID) | ORAL | Status: DC
  Administered 2014-05-13 – 2014-05-14 (×3): 40 mg via ORAL
  Filled 2014-05-12 (×6): qty 1

## 2014-05-12 MED ORDER — INSULIN ASPART 100 UNIT/ML ~~LOC~~ SOLN
20.0000 [IU] | Freq: Three times a day (TID) | SUBCUTANEOUS | Status: DC
  Administered 2014-05-13 – 2014-05-14 (×4): 20 [IU] via SUBCUTANEOUS

## 2014-05-12 MED ORDER — DILTIAZEM HCL ER COATED BEADS 120 MG PO CP24
120.0000 mg | ORAL_CAPSULE | Freq: Every day | ORAL | Status: DC
  Administered 2014-05-13 – 2014-05-14 (×2): 120 mg via ORAL
  Filled 2014-05-12 (×2): qty 1

## 2014-05-12 MED ORDER — SIMVASTATIN 10 MG PO TABS
10.0000 mg | ORAL_TABLET | Freq: Every day | ORAL | Status: DC
  Administered 2014-05-12 – 2014-05-13 (×2): 10 mg via ORAL
  Filled 2014-05-12 (×3): qty 1

## 2014-05-12 MED ORDER — INSULIN GLARGINE 100 UNIT/ML ~~LOC~~ SOLN
40.0000 [IU] | Freq: Every day | SUBCUTANEOUS | Status: DC
  Administered 2014-05-13 (×2): 40 [IU] via SUBCUTANEOUS
  Filled 2014-05-12 (×5): qty 0.4

## 2014-05-12 MED ORDER — WARFARIN - PHARMACIST DOSING INPATIENT: Freq: Every day | Status: DC

## 2014-05-12 MED ORDER — ROPINIROLE HCL 1 MG PO TABS: 2.0000 mg | ORAL_TABLET | Freq: Two times a day (BID) | ORAL | Status: DC

## 2014-05-12 MED ORDER — SOTALOL HCL 80 MG PO TABS
80.0000 mg | ORAL_TABLET | Freq: Two times a day (BID) | ORAL | Status: DC
  Administered 2014-05-12 – 2014-05-14 (×4): 80 mg via ORAL
  Filled 2014-05-12 (×5): qty 1

## 2014-05-12 MED ORDER — HYDROMORPHONE HCL 1 MG/ML IJ SOLN
1.0000 mg | Freq: Once | INTRAMUSCULAR | Status: AC
  Administered 2014-05-12: 1 mg via INTRAVENOUS
  Filled 2014-05-12: qty 1

## 2014-05-12 MED ORDER — METOCLOPRAMIDE HCL 5 MG PO TABS
5.0000 mg | ORAL_TABLET | Freq: Three times a day (TID) | ORAL | Status: DC
  Administered 2014-05-13 – 2014-05-14 (×4): 5 mg via ORAL
  Filled 2014-05-12 (×7): qty 1

## 2014-05-12 MED ORDER — ASPIRIN EC 81 MG PO TBEC
81.0000 mg | DELAYED_RELEASE_TABLET | Freq: Every day | ORAL | Status: DC
  Administered 2014-05-13 – 2014-05-14 (×2): 81 mg via ORAL
  Filled 2014-05-12 (×2): qty 1

## 2014-05-12 MED ORDER — WARFARIN SODIUM 7.5 MG PO TABS
7.5000 mg | ORAL_TABLET | ORAL | Status: AC
  Administered 2014-05-12: 7.5 mg via ORAL
  Filled 2014-05-12: qty 1

## 2014-05-12 MED ORDER — ROPINIROLE HCL 1 MG PO TABS
2.0000 mg | ORAL_TABLET | Freq: Two times a day (BID) | ORAL | Status: DC
  Administered 2014-05-12 – 2014-05-14 (×4): 2 mg via ORAL
  Filled 2014-05-12 (×5): qty 2

## 2014-05-12 MED ORDER — VANCOMYCIN HCL 10 G IV SOLR
1250.0000 mg | Freq: Two times a day (BID) | INTRAVENOUS | Status: DC
  Filled 2014-05-12: qty 1250

## 2014-05-12 MED ORDER — SODIUM CHLORIDE 0.9 % IV SOLN
INTRAVENOUS | Status: DC
  Administered 2014-05-12: 15 mL/h via INTRAVENOUS

## 2014-05-12 MED ORDER — HEPARIN (PORCINE) IN NACL 100-0.45 UNIT/ML-% IJ SOLN
1650.0000 [IU]/h | INTRAMUSCULAR | Status: DC
  Administered 2014-05-12: 1750 [IU]/h via INTRAVENOUS
  Administered 2014-05-13: 1650 [IU]/h via INTRAVENOUS
  Filled 2014-05-12 (×3): qty 250

## 2014-05-12 MED ORDER — VANCOMYCIN HCL IN DEXTROSE 1-5 GM/200ML-% IV SOLN
1000.0000 mg | Freq: Once | INTRAVENOUS | Status: DC
Start: 2014-05-12 — End: 2014-05-12
  Filled 2014-05-12: qty 200

## 2014-05-12 MED ORDER — PIPERACILLIN-TAZOBACTAM 3.375 G IVPB
3.3750 g | Freq: Once | INTRAVENOUS | Status: AC
  Administered 2014-05-12: 3.375 g via INTRAVENOUS
  Filled 2014-05-12: qty 50

## 2014-05-12 MED ORDER — COUMADIN BOOK
1.0000 | Freq: Once | Status: AC
  Administered 2014-05-12: 1
  Filled 2014-05-12: qty 1

## 2014-05-12 MED ORDER — GABAPENTIN 600 MG PO TABS
600.0000 mg | ORAL_TABLET | Freq: Three times a day (TID) | ORAL | Status: DC
  Administered 2014-05-12 – 2014-05-13 (×5): 600 mg via ORAL
  Filled 2014-05-12 (×6): qty 1

## 2014-05-12 NOTE — ED Provider Notes (Signed)
CSN: [id number]     Arrival date & time 05/12/14  1605 History   First MD Initiated Contact with Patient 05/12/14 1638     Chief Complaint  Patient presents with  . DVT     (Consider location/radiation/quality/duration/timing/severity/associated sxs/prior Treatment) The history is provided by the patient.  pt with hx iddm, afib, c/o right foot and lower leg swelling for the past week. Acute onset and progressive since onset.  Pt also states foot and lower leg very painful, warm, and red. Denies hx same. No hx cellulitis or mrsa. No hx dvt or pe. Denies cp or discomfort. Is on coumadin, hx afib, states coumadin was stopped 1 week ago in plan for epidural injection low back for pain relief.  Pt denies fever or chills. No trauma to leg.  Had outpatient vascular doppler today positive for lower leg dvt.     Past Medical History  Diagnosis Date  . Hypertension   . Diabetes mellitus without complication   . CHF (congestive heart failure)   . Arthritis   . Dysrhythmia     a fib  . Left foot drop     wears brace  . Hyperlipidemia   . History of snoring     pt had sleep study but does not have sleep apnea   Past Surgical History  Procedure Laterality Date  . Back surgery      x6  . Cardiac catheterization      [facility]  . Laminectomy with posterior lateral arthrodesis level 1 N/A 03/27/2013    Procedure: REEXPLORATION OF LUMBAR FUSION WITH REPLACEMENT OF SACRAL SCREWS AND ILIAC CREST BONE GRAFT REDO DECOMPRESSION LAMINECTOMY LUMBAR FIVE SACRAL ONE;  Surgeon: Elaina Hoops, MD;  Location: [place] [facility];  Service: Neurosurgery;  Laterality: N/A;  . Hardware removal N/A 10/16/2013    Procedure: Lumbar Hardware Removal;  Surgeon: Elaina Hoops, MD;  Location: [place] [facility];  Service: Neurosurgery;  Laterality: N/A;   History reviewed. No pertinent family history. History  Substance Use Topics  . Smoking status: Never Smoker   . Smokeless tobacco: Not on file  . Alcohol Use: No    Review  of Systems  Constitutional: Negative for fever.  HENT: Negative for sore throat.   Eyes: Negative for redness.  Respiratory: Negative for shortness of breath.   Cardiovascular: Negative for chest pain.  Gastrointestinal: Negative for vomiting, abdominal pain and diarrhea.  Genitourinary: Negative for flank pain.  Musculoskeletal: Negative for back pain and neck pain.  Skin: Negative for rash.  Neurological: Negative for headaches.  Hematological: Does not bruise/bleed easily.  Psychiatric/Behavioral: Negative for confusion.      Allergies  Elavil  Home Medications   Prior to Admission medications   Medication Sig Start Date End Date Taking? Authorizing Provider  APPLE CIDER VINEGAR PO Take 200 mg by mouth 2 (two) times daily.   Yes Historical Provider, MD  aspirin EC 81 MG tablet Take 81 mg by mouth daily.   Yes Historical Provider, MD  colchicine 0.6 MG tablet Take 0.6 mg by mouth 4 (four) times daily.   Yes Historical Provider, MD  diltiazem (CARDIZEM CD) 120 MG 24 hr capsule Take 120 mg by mouth daily.   Yes Historical Provider, MD  enalapril (VASOTEC) 20 MG tablet Take 20 mg by mouth daily.    Yes Historical Provider, MD  furosemide (LASIX) 40 MG tablet Take 40 mg by mouth 2 (two) times daily. 3 in the morning and 2 tablets at dinner  Yes Historical Provider, MD  insulin glargine (LANTUS) 100 UNIT/ML injection Inject 60 Units into the skin at bedtime.   Yes Historical Provider, MD  insulin regular (NOVOLIN R,HUMULIN R) 100 units/mL injection Inject 30 Units into the skin 3 (three) times daily before meals.   Yes Historical Provider, MD  meclizine (ANTIVERT) 25 MG tablet Take 25 mg by mouth 4 (four) times daily.    Yes Historical Provider, MD  metoCLOPramide (REGLAN) 5 MG tablet Take 5 mg by mouth 3 (three) times daily before meals.   Yes Historical Provider, MD  Omega-3 Fatty Acids (FISH OIL) 1200 MG CAPS Take 1 capsule by mouth 2 (two) times daily.   Yes Historical Provider,  MD  OVER THE COUNTER MEDICATION Take 2-3 tablets by mouth daily. Equate brand stool softener.  Take 2 tablets one day and 3 tablets the next day.   Yes Historical Provider, MD  rOPINIRole (REQUIP) 2 MG tablet Take 2 mg by mouth 2 (two) times daily.   Yes Historical Provider, MD  simvastatin (ZOCOR) 10 MG tablet Take 10 mg by mouth at bedtime.   Yes Historical Provider, MD  sotalol (BETAPACE) 80 MG tablet Take 80 mg by mouth 2 (two) times daily.   Yes Historical Provider, MD  traMADol (ULTRAM) 50 MG tablet Take 100 mg by mouth every 6 (six) hours as needed for pain.    Yes Historical Provider, MD  ferrous sulfate 325 (65 FE) MG tablet Take 325 mg by mouth daily with breakfast.    Historical Provider, MD  gabapentin (NEURONTIN) 600 MG tablet Take 600 mg by mouth 4 (four) times daily - after meals and at bedtime.     Historical Provider, MD  warfarin (COUMADIN) 5 MG tablet Take 5-7.5 mg by mouth daily. Take 7.5mg  tablet on Wednesday  And 5 mg all other days. Patient has not taken in the last week due to an injection.    Historical Provider, MD   BP 124/62 mmHg  Pulse 97  Temp(Src) 97.9 F (36.6 C)  Resp 18  Ht 5\' 11"  (1.803 m)  Wt 275 lb (124.739 kg)  BMI 38.37 kg/m2  SpO2 97% Physical Exam  Constitutional: He is oriented to person, place, and time. He appears well-developed and well-nourished. No distress.  HENT:  Head: Atraumatic.  Eyes: Pupils are equal, round, and reactive to light.  Neck: Neck supple. No tracheal deviation present.  Cardiovascular: Normal rate, regular rhythm, normal heart sounds and intact distal pulses.   Pulmonary/Chest: Effort normal and breath sounds normal. No accessory muscle usage. No respiratory distress.  Abdominal: Soft. Bowel sounds are normal. He exhibits no distension and no mass. There is no tenderness. There is no rebound and no guarding.  Obese   Musculoskeletal: Normal range of motion.  Marked right lower leg and foot edema. Skin of right lower leg  and foot very erythematous, warm, and tender. No necrotic or devitalized tissue. Distal pulses palp.   Neurological: He is alert and oriented to person, place, and time.  Skin: Skin is warm and dry. No rash noted.  Psychiatric: He has a normal mood and affect.  Nursing note and vitals reviewed.   ED Course  Procedures (including critical care time) Labs Review  Results for orders placed or performed during the hospital encounter of 05/12/14  CBC with Differential  Result Value Ref Range   WBC 11.4 (H) 4.0 - 10.5 K/uL   RBC 4.36 4.22 - 5.81 MIL/uL   Hemoglobin 14.0 13.0 - 17.0  g/dL   HCT 41.5 39.0 - 52.0 %   MCV 95.2 78.0 - 100.0 fL   MCH 32.1 26.0 - 34.0 pg   MCHC 33.7 30.0 - 36.0 g/dL   RDW 13.0 11.5 - 15.5 %   Platelets 175 150 - 400 K/uL   Neutrophils Relative % 70 43 - 77 %   Neutro Abs 8.0 (H) 1.7 - 7.7 K/uL   Lymphocytes Relative 19 12 - 46 %   Lymphs Abs 2.1 0.7 - 4.0 K/uL   Monocytes Relative 9 3 - 12 %   Monocytes Absolute 1.0 0.1 - 1.0 K/uL   Eosinophils Relative 2 0 - 5 %   Eosinophils Absolute 0.2 0.0 - 0.7 K/uL   Basophils Relative 0 0 - 1 %   Basophils Absolute 0.0 0.0 - 0.1 K/uL  Comprehensive metabolic panel  Result Value Ref Range   Sodium 140 137 - 147 mEq/L   Potassium 3.8 3.7 - 5.3 mEq/L   Chloride 98 96 - 112 mEq/L   CO2 30 19 - 32 mEq/L   Glucose, Bld 145 (H) 70 - 99 mg/dL   BUN 19 6 - 23 mg/dL   Creatinine, Ser 0.61 0.50 - 1.35 mg/dL   Calcium 9.2 8.4 - 10.5 mg/dL   Total Protein 7.6 6.0 - 8.3 g/dL   Albumin 3.5 3.5 - 5.2 g/dL   AST 28 0 - 37 U/L   ALT 16 0 - 53 U/L   Alkaline Phosphatase 118 (H) 39 - 117 U/L   Total Bilirubin 0.6 0.3 - 1.2 mg/dL   GFR calc non Af Amer >90 >90 mL/min   GFR calc Af Amer >90 >90 mL/min   Anion gap 12 5 - 15  Protime-INR  Result Value Ref Range   Prothrombin Time 15.0 11.6 - 15.2 seconds   INR 1.17 0.00 - 1.49      MDM  Iv ns. Labs.  Reviewed nursing notes and prior charts for additional history.    Reviewed outside vascular ultrasound report from [facility] c/w dvt.  Exam also c/w cellulitis, iv abx given.  Discussed anticoag options w pt, who has hx afib, and was on coumadin up until 6 days ago, held for possible epidural injection.  Pt indicates newer anticoag had been discussed w him, however due to cost concerns, he has elected to continue on coumadin tx.  As such, will order heparin per pharmacy, and plan for restart coumadin.  Discussed pt with teaching service resident, they will admit, request temp orders.  Pt requests pain rx. Dilaudid 1 mg iv.  Recheck pain improved.     Mirna Mires, MD 05/12/14 [phone]

## 2014-05-12 NOTE — H&P (Signed)
Date: 05/12/2014               Patient Name:  Phillip Meyer MRN: [id number]  DOB: 1940-10-19 Age / Sex: 73 y.o., male   PCP: Zorita Pang, MD         Medical Service: Internal Medicine Teaching Service         Attending Physician: Dr. Aldine Contes, MD    First Contact: Dr. Charlott Rakes Pager: [phone]  Second Contact: Dr. Bing Neighbors Pager: [phone]       After Hours (After 5p/  First Contact Pager: [phone]  weekends / holidays): Second Contact Pager: [phone]   Chief Complaint: painful R leg swelling, erythema  History of Present Illness: [name] is a 73 year old male with DM2, a-fib, CHF, HTN who presents with a painful, erythematous, swollen R leg. His sister Phillip Meyer) and neighbor were present at bedside during the time of interview.  Last Tuesday (11/14), he discontinued taking his coumadin in preparation for an epidural injection. On Friday (11/17), he went to his PCP's office after he noted R leg swelling. His PCP felt it was due to fluid and increased his diuretic dose. Swelling and pain worsened over the next few days to the point that he went to Urgent Care today. Dopplers done at [facility] showed a DVT, so he came to this hospital.  He takes coumadin for his atrial fibrillation. Most of his day is spent lying around and sitting outside on his porch as his gait limits his ability to ambulate. He follows with Dr. Saintclair Halsted for his back surgery with most recent one being back in April 2015 for removal of painful hardware. Otherwise, he denies any tobacco and EtOH use, recent leg trauma, previous cancer, previous DVT, family history of DVT, fever, shortness of breath, chest pain, dizziness.  In the ED, he was started on vancomycin and Zosyn as well as heparin.   Meds: Current Facility-Administered Medications  Medication Dose Route Frequency Provider Last Rate Last Dose  . 0.9 %  sodium chloride infusion   Intravenous Continuous Mirna Mires, MD 15 mL/hr at  05/12/14 1713 15 mL/hr at 05/12/14 1713  . piperacillin-tazobactam (ZOSYN) IVPB 3.375 g  3.375 g Intravenous Once Mirna Mires, MD 12.5 mL/hr at 05/12/14 1751 3.375 g at 05/12/14 1751  . vancomycin (VANCOCIN) IVPB 1000 mg/200 mL premix  1,000 mg Intravenous Once Mirna Mires, MD       Current Outpatient Prescriptions  Medication Sig Dispense Refill  . APPLE CIDER VINEGAR PO Take 200 mg by mouth 2 (two) times daily.    Marland Kitchen aspirin EC 81 MG tablet Take 81 mg by mouth daily.    . colchicine 0.6 MG tablet Take 0.6 mg by mouth 4 (four) times daily.    Marland Kitchen diltiazem (CARDIZEM CD) 120 MG 24 hr capsule Take 120 mg by mouth daily.    . enalapril (VASOTEC) 20 MG tablet Take 20 mg by mouth daily.     . furosemide (LASIX) 40 MG tablet Take 40 mg by mouth 2 (two) times daily. 3 in the morning and 2 tablets at dinner    . insulin glargine (LANTUS) 100 UNIT/ML injection Inject 60 Units into the skin at bedtime.    . insulin regular (NOVOLIN R,HUMULIN R) 100 units/mL injection Inject 30 Units into the skin 3 (three) times daily before meals.    . meclizine (ANTIVERT) 25 MG tablet Take 25 mg by mouth 4 (four) times daily.     Marland Kitchen  metoCLOPramide (REGLAN) 5 MG tablet Take 5 mg by mouth 3 (three) times daily before meals.    . Omega-3 Fatty Acids (FISH OIL) 1200 MG CAPS Take 1 capsule by mouth 2 (two) times daily.    Marland Kitchen OVER THE COUNTER MEDICATION Take 2-3 tablets by mouth daily. Equate brand stool softener.  Take 2 tablets one day and 3 tablets the next day.    Marland Kitchen rOPINIRole (REQUIP) 2 MG tablet Take 2 mg by mouth 2 (two) times daily.    . simvastatin (ZOCOR) 10 MG tablet Take 10 mg by mouth at bedtime.    . sotalol (BETAPACE) 80 MG tablet Take 80 mg by mouth 2 (two) times daily.    . traMADol (ULTRAM) 50 MG tablet Take 100 mg by mouth every 6 (six) hours as needed for pain.     . ferrous sulfate 325 (65 FE) MG tablet Take 325 mg by mouth daily with breakfast.    . gabapentin (NEURONTIN) 600 MG tablet Take 600 mg by  mouth 4 (four) times daily - after meals and at bedtime.     Marland Kitchen warfarin (COUMADIN) 5 MG tablet Take 5-7.5 mg by mouth daily. Take 7.5mg  tablet on Wednesday  And 5 mg all other days. Patient has not taken in the last week due to an injection.      Allergies: Allergies as of 05/12/2014 - Review Complete 05/12/2014  Allergen Reaction Noted  . Elavil [amitriptyline] Other (See Comments) 12/17/2012   Past Medical History  Diagnosis Date  . Hypertension   . Diabetes mellitus without complication   . CHF (congestive heart failure)   . Arthritis   . Dysrhythmia     a fib  . Left foot drop     wears brace  . Hyperlipidemia   . History of snoring     pt had sleep study but does not have sleep apnea   Past Surgical History  Procedure Laterality Date  . Back surgery      x6  . Cardiac catheterization      [facility]  . Laminectomy with posterior lateral arthrodesis level 1 N/A 03/27/2013    Procedure: REEXPLORATION OF LUMBAR FUSION WITH REPLACEMENT OF SACRAL SCREWS AND ILIAC CREST BONE GRAFT REDO DECOMPRESSION LAMINECTOMY LUMBAR FIVE SACRAL ONE;  Surgeon: Elaina Hoops, MD;  Location: [place] [facility];  Service: Neurosurgery;  Laterality: N/A;  . Hardware removal N/A 10/16/2013    Procedure: Lumbar Hardware Removal;  Surgeon: Elaina Hoops, MD;  Location: [place] [facility];  Service: Neurosurgery;  Laterality: N/A;   History reviewed. No pertinent family history. History   Social History  . Marital Status: Widowed    Spouse Name: N/A    Number of Children: N/A  . Years of Education: N/A   Occupational History  . Not on file.   Social History Main Topics  . Smoking status: Never Smoker   . Smokeless tobacco: Not on file  . Alcohol Use: No  . Drug Use: No  . Sexual Activity: Not on file   Other Topics Concern  . Not on file   Social History Narrative    Review of Systems: Review of Systems  Constitutional: Negative for fever and chills.  Respiratory: Negative for shortness of breath.     Cardiovascular: Positive for leg swelling. Negative for chest pain.  Musculoskeletal: Positive for back pain.       Chronic  Neurological: Negative for dizziness.     Physical Exam: Blood pressure 122/68, pulse 92,  temperature 97.9 F (36.6 C), resp. rate 27, height 5\' 11"  (1.803 m), weight 275 lb (124.739 kg), SpO2 94 %.  General: resting in bed, NAD HEENT: PERRL, EOMI, no scleral icterus, oropharynx clear Cardiac: RRR, no rubs, murmurs or gallops Pulm: exam limited to the anterior fields but clear to auscultation bilaterally Abd: soft, nontender, nondistended, BS present Ext: pitting edema bilaterally, R leg swollen and warm to touch with erythema though not tender to touch, DP/PT pulses difficult to palpate though limited by edema Neuro: responds to questions appropriately; moving all extremities freely   Lab results: Basic Metabolic Panel:  Recent Labs  05/12/14 1705  NA 140  K 3.8  CL 98  CO2 30  GLUCOSE 145*  BUN 19  CREATININE 0.61  CALCIUM 9.2   Liver Function Tests:  Recent Labs  05/12/14 1705  AST 28  ALT 16  ALKPHOS 118*  BILITOT 0.6  PROT 7.6  ALBUMIN 3.5   CBC:  Recent Labs  05/12/14 1705  WBC 11.4*  NEUTROABS 8.0*  HGB 14.0  HCT 41.5  MCV 95.2  PLT 175   Coagulation:  Recent Labs  05/12/14 1705  LABPROT 15.0  INR 1.17    Assessment & Plan by Problem:  Mr. Carton is a 73 year old male with DM2, a-fib, CHF, HTN, HLD who presents with DVT and possible sepsis.  DVT and possible sepsis: Likely in the setting of discontinuing his warfarin. INR 1.17 today which is fairly consistent with prior values though all subtherapeutic. SIRS criteria 3/4 though leg does not appear to be draining pus.  -Start heparin IV with plan to bridge with coumadin -Check INR/PTT -Check lactate -Check blood cultures x 2 to r/o infection -Continue vancomycin & Zosyn (Abx Day 1)  DM2: A1c not on file though previous CBGs trend mainly in 100s. -Reduce  home Lantus 60u->40u QHS -Reduce home Novolog 30u->20u TID -Continue home Reglan 5mg  & Requip 2mg  BID -Continue home gabapentin 600mg  QID  A-fib: Continue home sotalol 80mg  BID. -Monitor on telemetry  HTN: Continue home enalapril 20mg , Lasix 40mg  BID, diltiazem 120mg .  CHF: Per self-report. No echo on file. -Lasix as noted above -Monitor daily weights, strict I&O  HLD: Continue home Zocor 10mg .   #FEN:  -Diet: Carb Modified  #DVT prophylaxis: heparin  #CODE STATUS: FULL CODE -Would like a trial of CPR and intubation for 72 hours before considering supportive measures -Per him, son Harrie Jeans is HCPOA though sister Phillip Meyer may make decisions on his behalf while here in the hospital   Dispo: Disposition is deferred at this time, awaiting improvement of current medical problems.   The patient does have a current PCP (Zorita Pang, MD) and does not need an [facility] hospital follow-up appointment after discharge.  The patient does have transportation limitations that hinder transportation to clinic appointments.  Signed: Charlott Rakes, MD 05/12/2014, 6:10 PM

## 2014-05-12 NOTE — ED Notes (Signed)
Pt placed on 2L o2 [place] d/t o2 sats dropping into 80's.

## 2014-05-12 NOTE — ED Notes (Signed)
Phlebotomy at bedside to collect blood cultures

## 2014-05-12 NOTE — Progress Notes (Signed)
  Pt admitted to the unit. Pt is stable, alert and oriented per baseline. Oriented to room, staff, and call bell. Educated to call for any assistance. Bed in lowest position, call bell within reach- will continue to monitor. 

## 2014-05-12 NOTE — ED Notes (Signed)
admitting MD at bedside. They do want blood cultures. Vancomycin held until cultures are completed.

## 2014-05-12 NOTE — ED Notes (Signed)
Pt sts sent here for positive DVT in right leg. Pt sig redness and swelling.

## 2014-05-12 NOTE — Progress Notes (Addendum)
ANTICOAGULATION CONSULT NOTE - Initial Consult  Pharmacy Consult for Heparin Indication: DVT  Allergies  Allergen Reactions  . Elavil [Amitriptyline] Other (See Comments)    Passes out    Patient Measurements: Height: 5\' 11"  (180.3 cm) Weight: 275 lb (124.739 kg) IBW/kg (Calculated) : 75.3 Heparin Dosing Weight: 103.3 kg  Vital Signs: Temp: 97.9 F (36.6 C) (11/23 1615) BP: 112/64 mmHg (11/23 1800) Pulse Rate: 84 (11/23 1800)  Labs:  Recent Labs  05/12/14 1705  HGB 14.0  HCT 41.5  PLT 175  LABPROT 15.0  INR 1.17  CREATININE 0.61    Estimated Creatinine Clearance: 110.6 mL/min (by C-G formula based on Cr of 0.61).   Medical History: Past Medical History  Diagnosis Date  . Hypertension   . Diabetes mellitus without complication   . CHF (congestive heart failure)   . Arthritis   . Dysrhythmia     a fib  . Left foot drop     wears brace  . Hyperlipidemia   . History of snoring     pt had sleep study but does not have sleep apnea    Assessment: 73 yo m who presented to the ED on 11/23 for positive DVT in right leg.  Patient is on warfarin PTA 7.5 mg on Wednesday and 5 mg all other days of the week.  Patient has not taken warfarin in the last week due to receiving a certain injection.  INR on admission is 1.17.  Pharmacy is consulted to begin heparin for new DVT. Per MD, he will restart patient's warfarin. Hgb 14, plts 175, no bleeding noted.  Goal of Therapy:  Heparin level 0.3-0.7 units/ml Monitor platelets by anticoagulation protocol: Yes   Plan:  Heparin bolus 4,000 units x 1  Heparin infusion at 1750 units/hr (~17 units/kg/hr) 8-hr HL @ 0230 Daily HL and CBC F/u warfarin restart Monitor hgb/plts, s/s of bleeding, clinical course  Cassie L. Nicole Kindred, PharmD Clinical Pharmacy Resident Pager: [phone] 05/12/2014 6:26 PM   ADDENDUM:   Consult:  Coumadin Indication:  DVT  Lab Results  Component Value Date   INR 1.17 05/12/2014   INR 1.04  10/17/2013   INR 1.10 10/16/2013    Medication PTA: Prescriptions prior to admission  Medication Sig Last Dose  . warfarin (COUMADIN) 5 MG tablet Take 5-7.5 mg by mouth daily. Take 7.5mg  tablet on Wednesday  And 5 mg all other days.  05/07/2014 at 6p    PLAN:  1. Coumadin 7.5 mg now. Daily Heparin Levels, INR, Platelet counts, CBC.  Monitor for bleeding complications.  Discharge education pending  Selena Batten.D. ,  05/12/2014, 9:41 PM

## 2014-05-12 NOTE — Consult Note (Signed)
PHARMACY CONSULT NOTE - INITIAL  Pharmacy Consult for :   Vancomycin Indication:  Cellulitis  Hospital Problems: Active Problems:   IDDM (insulin dependent diabetes mellitus)   DVT (deep venous thrombosis)   Allergies: Allergies  Allergen Reactions  . Elavil [Amitriptyline] Other (See Comments)    Passes out    Patient Measurements: Height: 5\' 11"  (180.3 cm) Weight: 281 lb (127.461 kg) IBW/kg (Calculated) : 75.3  Vital Signs: BP 117/82 mmHg  Pulse 101  Temp(Src) 98.5 F (36.9 C) (Oral)  Resp 21  Ht 5\' 11"  (1.803 m)  Wt 281 lb (127.461 kg)  BMI 39.21 kg/m2  SpO2 93%  Labs:  Recent Labs  05/12/14 1705  WBC 11.4*  HGB 14.0  PLT 175  CREATININE 0.61   Estimated Creatinine Clearance: 111.9 mL/min (by C-G formula based on Cr of 0.61).   Microbiology: No results found for this or any previous visit (from the past 720 hour(s)).  Medical/Surgical History: Past Medical History  Diagnosis Date  . Hypertension   . Diabetes mellitus without complication   . CHF (congestive heart failure)   . Arthritis   . Dysrhythmia     a fib  . Left foot drop     wears brace  . Hyperlipidemia   . History of snoring     pt had sleep study but does not have sleep apnea   Past Surgical History  Procedure Laterality Date  . Back surgery      x6  . Cardiac catheterization      [facility]  . Laminectomy with posterior lateral arthrodesis level 1 N/A 03/27/2013    Procedure: REEXPLORATION OF LUMBAR FUSION WITH REPLACEMENT OF SACRAL SCREWS AND ILIAC CREST BONE GRAFT REDO DECOMPRESSION LAMINECTOMY LUMBAR FIVE SACRAL ONE;  Surgeon: Elaina Hoops, MD;  Location: [place] [facility];  Service: Neurosurgery;  Laterality: N/A;  . Hardware removal N/A 10/16/2013    Procedure: Lumbar Hardware Removal;  Surgeon: Elaina Hoops, MD;  Location: [place] [facility];  Service: Neurosurgery;  Laterality: N/A;    Current Medication[s] Include: Medication PTA: Prescriptions prior to admission  Medication Sig  Dispense Refill Last Dose  . APPLE CIDER VINEGAR PO Take 200 mg by mouth 2 (two) times daily.   05/12/2014 at Unknown time  . aspirin EC 81 MG tablet Take 81 mg by mouth daily.   05/12/2014 at Unknown time  . colchicine 0.6 MG tablet Take 0.6 mg by mouth 4 (four) times daily.   05/12/2014 at Unknown time  . diltiazem (CARDIZEM CD) 120 MG 24 hr capsule Take 120 mg by mouth daily.   05/12/2014 at Unknown time  . enalapril (VASOTEC) 20 MG tablet Take 20 mg by mouth daily.    05/11/2014 at Unknown time  . furosemide (LASIX) 40 MG tablet Take 40 mg by mouth 2 (two) times daily. 3 in the morning and 2 tablets at dinner   05/12/2014 at Unknown time  . insulin glargine (LANTUS) 100 UNIT/ML injection Inject 60 Units into the skin at bedtime.   05/11/2014 at Unknown time  . insulin regular (NOVOLIN R,HUMULIN R) 100 units/mL injection Inject 30 Units into the skin 3 (three) times daily before meals.   05/12/2014 at Unknown time  . meclizine (ANTIVERT) 25 MG tablet Take 25 mg by mouth 4 (four) times daily.    05/12/2014 at Unknown time  . metoCLOPramide (REGLAN) 5 MG tablet Take 5 mg by mouth 3 (three) times daily before meals.   05/11/2014 at Unknown time  .  Omega-3 Fatty Acids (FISH OIL) 1200 MG CAPS Take 1 capsule by mouth 2 (two) times daily.   05/12/2014 at Unknown time  . OVER THE COUNTER MEDICATION Take 2-3 tablets by mouth daily. Equate brand stool softener.  Take 2 tablets one day and 3 tablets the next day.   05/12/2014 at Unknown time  . rOPINIRole (REQUIP) 2 MG tablet Take 2 mg by mouth 2 (two) times daily.   05/12/2014 at Unknown time  . simvastatin (ZOCOR) 10 MG tablet Take 10 mg by mouth at bedtime.   05/11/2014 at Unknown time  . sotalol (BETAPACE) 80 MG tablet Take 80 mg by mouth 2 (two) times daily.   05/12/2014 at Unknown time  . traMADol (ULTRAM) 50 MG tablet Take 100 mg by mouth every 6 (six) hours as needed for pain.    05/12/2014 at Unknown time  . ferrous sulfate 325 (65 FE) MG tablet  Take 325 mg by mouth daily with breakfast.   Completed Course at Unknown time  . gabapentin (NEURONTIN) 600 MG tablet Take 600 mg by mouth 4 (four) times daily - after meals and at bedtime.    10/16/2013 at Unknown time  . warfarin (COUMADIN) 5 MG tablet Take 5-7.5 mg by mouth daily. Take 7.5mg  tablet on Wednesday  And 5 mg all other days. Patient has not taken in the last week due to an injection.   05/07/2014 at 6p     Scheduled:  . [START ON 05/13/2014] aspirin EC  81 mg Oral Daily  . [START ON 05/13/2014] diltiazem  120 mg Oral Daily  . enalapril  20 mg Oral Daily  . furosemide  40 mg Oral BID  . gabapentin  600 mg Oral TID PC & HS  . [START ON 05/13/2014] insulin aspart  20 Units Subcutaneous TID WC  . insulin glargine  40 Units Subcutaneous QHS  . [START ON 05/13/2014] metoCLOPramide  5 mg Oral TID AC  . rOPINIRole  2 mg Oral BID  . simvastatin  10 mg Oral QHS  . sotalol  80 mg Oral BID    Infusion[s]: Infusions:  . heparin 1,750 Units/hr (05/12/14 1844)    Antibiotic[s]: Anti-infectives    Start     Dose/Rate Route Frequency Ordered Stop   05/12/14 1745  piperacillin-tazobactam (ZOSYN) IVPB 3.375 g     3.375 g12.5 mL/hr over 240 Minutes Intravenous  Once 05/12/14 1741 05/12/14 1848   05/12/14 1745  vancomycin (VANCOCIN) IVPB 1000 mg/200 mL premix  Status:  Discontinued     1,000 mg200 mL/hr over 60 Minutes Intravenous  Once 05/12/14 1741 05/12/14 1950      Assessment:  73 y/o male admitted with DVT and who has received Zosyn and Vancomycin doses in the ED for Cellulitis.  Vancomycin is to be continued per Pharmacy consult.  Estimated CrCl ~ 120 ml/min.  WBC 11.4,  Afebrile  Goal of Therapy:   Vancomycin trough level 10-15 mcg/ml Monitor renal function, WBC, fever curve, any cultures/sensitivities, and clinical progression.  Plan:  1. Vancomycin 1250 mg IV q 12 hours. 2. Follow up SCr, UOP, cultures, Levels, clinical course and adjust as clinically  indicated.  Harrie Cazarez, Craig Guess,  Pharm.D,    11/23/20157:59 PM

## 2014-05-12 NOTE — Plan of Care (Signed)
Problem: Phase I Progression Outcomes Goal: Voiding-avoid urinary catheter unless indicated Outcome: Completed/Met Date Met:  05/12/14

## 2014-05-13 DIAGNOSIS — I509 Heart failure, unspecified: Secondary | ICD-10-CM

## 2014-05-13 DIAGNOSIS — I10 Essential (primary) hypertension: Secondary | ICD-10-CM

## 2014-05-13 DIAGNOSIS — I82409 Acute embolism and thrombosis of unspecified deep veins of unspecified lower extremity: Secondary | ICD-10-CM

## 2014-05-13 DIAGNOSIS — E785 Hyperlipidemia, unspecified: Secondary | ICD-10-CM

## 2014-05-13 DIAGNOSIS — I4891 Unspecified atrial fibrillation: Secondary | ICD-10-CM

## 2014-05-13 DIAGNOSIS — E119 Type 2 diabetes mellitus without complications: Secondary | ICD-10-CM

## 2014-05-13 LAB — CBC
HCT: 39.5 % (ref 39.0–52.0)
HEMOGLOBIN: 13.3 g/dL (ref 13.0–17.0)
MCH: 32.8 pg (ref 26.0–34.0)
MCHC: 33.7 g/dL (ref 30.0–36.0)
MCV: 97.3 fL (ref 78.0–100.0)
Platelets: 182 10*3/uL (ref 150–400)
RBC: 4.06 MIL/uL — ABNORMAL LOW (ref 4.22–5.81)
RDW: 13 % (ref 11.5–15.5)
WBC: 10.4 10*3/uL (ref 4.0–10.5)

## 2014-05-13 LAB — BASIC METABOLIC PANEL
Anion gap: 12 (ref 5–15)
BUN: 20 mg/dL (ref 6–23)
CO2: 30 mEq/L (ref 19–32)
CREATININE: 0.8 mg/dL (ref 0.50–1.35)
Calcium: 8.8 mg/dL (ref 8.4–10.5)
Chloride: 100 mEq/L (ref 96–112)
GFR calc Af Amer: >90 mL/min (ref >90)
GFR, EST NON AFRICAN AMERICAN: 87 mL/min — AB (ref >90)
GLUCOSE: 178 mg/dL — AB (ref 70–99)
POTASSIUM: 3.4 meq/L — AB (ref 3.7–5.3)
Sodium: 142 mEq/L (ref 137–147)

## 2014-05-13 LAB — GLUCOSE, CAPILLARY
GLUCOSE-CAPILLARY: 183 mg/dL — AB (ref 70–99)
Glucose-Capillary: 158 mg/dL — ABNORMAL HIGH (ref 70–99)
Glucose-Capillary: 103 mg/dL — ABNORMAL HIGH (ref 70–99)
Glucose-Capillary: 114 mg/dL — ABNORMAL HIGH (ref 70–99)
Glucose-Capillary: 102 mg/dL — ABNORMAL HIGH (ref 70–99)

## 2014-05-13 LAB — PROTIME-INR
INR: 1.22 (ref 0.00–1.49)
PROTHROMBIN TIME: 15.5 s — AB (ref 11.6–15.2)

## 2014-05-13 LAB — HEPARIN LEVEL (UNFRACTIONATED)
HEPARIN UNFRACTIONATED: 0.7 [IU]/mL (ref 0.30–0.70)
Heparin Unfractionated: 0.56 IU/mL (ref 0.30–0.70)

## 2014-05-13 MED ORDER — RIVAROXABAN 15 MG PO TABS
15.0000 mg | ORAL_TABLET | Freq: Two times a day (BID) | ORAL | Status: DC
  Administered 2014-05-13 – 2014-05-14 (×3): 15 mg via ORAL
  Filled 2014-05-13 (×5): qty 1

## 2014-05-13 MED ORDER — RIVAROXABAN 20 MG PO TABS: 20.0000 mg | ORAL_TABLET | Freq: Every day | ORAL | Status: DC

## 2014-05-13 NOTE — Care Management Note (Addendum)
    Page 1 of 2   05/14/2014     3:43:30 PM CARE MANAGEMENT NOTE 05/14/2014  Patient:  SYLVIA, KONDRACKI   Account Number:  [id number]  Date Initiated:  05/13/2014  Documentation initiated by:  Tomi Bamberger  Subjective/Objective Assessment:   dx dvt  admit- lives alone.     Action/Plan:   pt eval-hhpt   Anticipated DC Date:  05/14/2014   Anticipated DC Plan:  [place]  CM consult      [facility] Choice  HOME HEALTH   Choice offered to / List presented to:  C-1 Patient        [place] arranged  [facility]      [place].   Status of service:  Completed, signed off Medicare Important Message given?  YES (If response is "NO", the following Medicare IM given date fields will be blank) Date Medicare IM given:  05/13/2014 Medicare IM given by:  Tomi Bamberger Date Additional Medicare IM given:   Additional Medicare IM given by:    Discharge Disposition:  [place]  Per UR Regulation:  Reviewed for med. necessity/level of care/duration of stay  If discussed at [place] of Stay Meetings, dates discussed:    Comments:  05/14/14 [place], BSN [phone] patient chose, [facility], referral made to [facility] for hhpt, [place] notified. Soc will begin 24-48 hrs post dc.  Patient for dc today.  05/12/14 [place], BSN [phone] patient states physical therapy has not seen him yet, he states if he needs hhpt he would like to work with [facility], Miranda with [facility] notified of this iinformation.  Patient states he will be dc tomorrow.

## 2014-05-13 NOTE — Progress Notes (Signed)
Pt seen and examined with Dr. Posey Pronto.  In brief, patient is a 73 y/o male with PMH of DM, afib, HTN who p/w pain and swelling in his R LE. Pt states he stopped taking coumadin 1 week ago as he was due to get an epidural injection this week. Approx 4 days ago he noted swelling in his right leg and went to his PCP. His diuretic dose was increased but over the next couple of days he noted worsening pain and swelling and was seen at [facility] where he was found to have a DVT and came to [facility] for further treatment. No CP, no SOB, no palpitations, no diaphoresis, no lightheadedness, no syncope, no fevers.  Physical Exam: Gen: AAO*3, NAD CVS: RRR, normal heart sounds Pulm: CTA b/l Abd: soft, non tender, BS + Ext: R LE edema, erythema and increased local warmth  Assessment and Plan: 73 y/o male with R LE DVT  DVT: - d.c heparin gtt and start xarelto PO - Recheck CBC in AM - No further w/u for now  DM: - c.w current regimen - BS in the 100s today  Afib: - c/w xarelto and sotalol  Likely d/c home in AM

## 2014-05-13 NOTE — Progress Notes (Signed)
Nutrition Brief Note  Patient identified on the Malnutrition Screening Tool (MST) Report  Wt Readings from Last 15 Encounters:  05/12/14 281 lb (127.461 kg)  10/11/13 293 lb 14.4 oz (133.312 kg)  03/27/13 282 lb 4 oz (128.029 kg)  03/14/13 282 lb 4 oz (128.028 kg)  12/26/12 296 lb 1.6 oz (134.31 kg)  12/17/12 287 lb 4.8 oz (130.318 kg)    Body mass index is 39.21 kg/(m^2). Patient meets criteria for class II obesity based on current BMI. Pt reports a good appetite and eating well currently and PTA at home. Weight has been stable.  Current diet order is carbohydrate modified, patient is consuming approximately 100% of meals at this time. Labs and medications reviewed.   No nutrition interventions warranted at this time. If nutrition issues arise, please consult RD.   Kallie Locks, MS, RD, LDN Pager # [phone] After hours/ weekend pager # [phone]

## 2014-05-13 NOTE — Progress Notes (Signed)
ANTICOAGULATION CONSULT NOTE - Follow Up Consult  Pharmacy Consult for heparin Indication: DVT  Labs:  Recent Labs  05/12/14 1705 05/13/14 0220  HGB 14.0 13.3  HCT 41.5 39.5  PLT 175 182  LABPROT 15.0 15.5*  INR 1.17 1.22  HEPARINUNFRC  --  0.56  CREATININE 0.61 0.80    Assessment/Plan:  73yo male therapeutic on heparin with initial dosing for DVT. Will continue gtt at current rate and confirm stable with additional level.   Wynona Neat, PharmD, BCPS  05/13/2014,3:15 AM

## 2014-05-13 NOTE — Discharge Instructions (Addendum)
Thank you for trusting [place] with your medical care!  You were hospitalized for deep venous thrombosis and treated with Xarelto. Please continue taking this medication per the instructions listed below, and follow-up with Dr. Micheal Likens in 7-10 days as well.   The starting dose is one 15 mg tablet taken TWICE daily with food for the FIRST 21 DAYS then on 06/03/14  the dose is changed to one 20 mg tablet taken ONCE A DAY with your evening meal.  What do you do if you miss a dose? If you are taking Xarelto TWICE DAILY and you miss a dose, take it as soon as you remember. You may take two 15 mg tablets (total 30 mg) at the same time then resume your regularly scheduled 15 mg twice daily the next day.  If you are taking Xarelto ONCE DAILY and you miss a dose, take it as soon as you remember on the same day then continue your regularly scheduled once daily regimen the next day. Do not take two doses of Xarelto at the same time.     Information on my medicine - XARELTO (rivaroxaban)   WHY WAS XARELTO PRESCRIBED FOR YOU? Xarelto was prescribed to treat blood clots that may have been found in the veins of your legs (deep vein thrombosis) or in your lungs (pulmonary embolism) and to reduce the risk of them occurring again.  What do you need to know about Xarelto? DO NOT stop taking Xarelto without talking to the health care provider who prescribed the medication.  Refill your prescription for 20 mg tablets before you run out.  After discharge, you should have regular check-up appointments with your healthcare provider that is prescribing your Xarelto.  In the future your dose may need to be changed if your kidney function changes by a significant amount.  Important Safety Information Xarelto is a blood thinner medicine that can cause bleeding. You should call your healthcare provider right away if you experience any of the following: ? Bleeding from an injury or your nose that does not  stop. ? Unusual colored urine (red or dark brown) or unusual colored stools (red or black). ? Unusual bruising for unknown reasons. ? A serious fall or if you hit your head (even if there is no bleeding).  Some medicines may interact with Xarelto and might increase your risk of bleeding while on Xarelto. To help avoid this, consult your healthcare provider or pharmacist prior to using any new prescription or non-prescription medications, including herbals, vitamins, non-steroidal anti-inflammatory drugs (NSAIDs) and supplements.  This website has more information on Xarelto: [email or web address]

## 2014-05-13 NOTE — Progress Notes (Addendum)
ANTICOAGULATION CONSULT NOTE - Initial Consult  Pharmacy Consult for Heparin  Indication: DVT and AFib  Allergies  Allergen Reactions  . Elavil [Amitriptyline] Other (See Comments)    Passes out    Patient Measurements: Height: 5\' 11"  (180.3 cm) Weight: 281 lb (127.461 kg) IBW/kg (Calculated) : 75.3 Heparin Dosing Weight: 103 kg  Vital Signs: Temp: 97.7 F (36.5 C) (11/24 0616) Temp Source: Oral (11/24 0616) BP: 99/59 mmHg (11/24 0643) Pulse Rate: 72 (11/24 0616)  Labs:  Recent Labs  05/12/14 1705 05/13/14 0220 05/13/14 0955  HGB 14.0 13.3  --   HCT 41.5 39.5  --   PLT 175 182  --   LABPROT 15.0 15.5*  --   INR 1.17 1.22  --   HEPARINUNFRC  --  0.56 0.70  CREATININE 0.61 0.80  --     Estimated Creatinine Clearance: 111.9 mL/min (by C-G formula based on Cr of 0.8).   Medical History: Past Medical History  Diagnosis Date  . Hypertension   . Diabetes mellitus without complication   . CHF (congestive heart failure)   . Arthritis   . Dysrhythmia     a fib  . Left foot drop     wears brace  . Hyperlipidemia   . History of snoring     pt had sleep study but does not have sleep apnea    Medications:  Prescriptions prior to admission  Medication Sig Dispense Refill Last Dose  . APPLE CIDER VINEGAR PO Take 200 mg by mouth 2 (two) times daily.   05/12/2014 at Unknown time  . aspirin EC 81 MG tablet Take 81 mg by mouth daily.   05/12/2014 at Unknown time  . colchicine 0.6 MG tablet Take 0.6 mg by mouth 4 (four) times daily.   05/12/2014 at Unknown time  . diltiazem (CARDIZEM CD) 120 MG 24 hr capsule Take 120 mg by mouth daily.   05/12/2014 at Unknown time  . enalapril (VASOTEC) 20 MG tablet Take 20 mg by mouth daily.    05/11/2014 at Unknown time  . furosemide (LASIX) 40 MG tablet Take 40 mg by mouth 2 (two) times daily. 3 in the morning and 2 tablets at dinner   05/12/2014 at Unknown time  . insulin glargine (LANTUS) 100 UNIT/ML injection Inject 60 Units into  the skin at bedtime.   05/11/2014 at Unknown time  . insulin regular (NOVOLIN R,HUMULIN R) 100 units/mL injection Inject 30 Units into the skin 3 (three) times daily before meals.   05/12/2014 at Unknown time  . meclizine (ANTIVERT) 25 MG tablet Take 25 mg by mouth 4 (four) times daily.    05/12/2014 at Unknown time  . metoCLOPramide (REGLAN) 5 MG tablet Take 5 mg by mouth 3 (three) times daily before meals.   05/11/2014 at Unknown time  . Omega-3 Fatty Acids (FISH OIL) 1200 MG CAPS Take 1 capsule by mouth 2 (two) times daily.   05/12/2014 at Unknown time  . OVER THE COUNTER MEDICATION Take 2-3 tablets by mouth daily. Equate brand stool softener.  Take 2 tablets one day and 3 tablets the next day.   05/12/2014 at Unknown time  . rOPINIRole (REQUIP) 2 MG tablet Take 2 mg by mouth 2 (two) times daily.   05/12/2014 at Unknown time  . simvastatin (ZOCOR) 10 MG tablet Take 10 mg by mouth at bedtime.   05/11/2014 at Unknown time  . sotalol (BETAPACE) 80 MG tablet Take 80 mg by mouth 2 (two) times daily.  05/12/2014 at Unknown time  . traMADol (ULTRAM) 50 MG tablet Take 100 mg by mouth every 6 (six) hours as needed for pain.    05/12/2014 at Unknown time  . ferrous sulfate 325 (65 FE) MG tablet Take 325 mg by mouth daily with breakfast.   Completed Course at Unknown time  . gabapentin (NEURONTIN) 600 MG tablet Take 600 mg by mouth 4 (four) times daily - after meals and at bedtime.    10/16/2013 at Unknown time  . warfarin (COUMADIN) 5 MG tablet Take 5-7.5 mg by mouth daily. Take 7.5mg  tablet on Wednesday  And 5 mg all other days. Patient has not taken in the last week due to an injection.   05/07/2014 at 6p    Assessment: 73 yo male on warfarin prior to admission for AFib.  PTA dose: 7.5 mg qWed, 5 mg on all other days.  Pt has not taken warfarin since 11/18 due to upcoming epidural.  He was admitted with new DVT and was started on heparin gtt. HL this morning was WNL at the top end of therapeutic range.  SCr <1, hgb, plts wnl and stable.  Pt rec'd warfarin 10 mg last night, INR 1.22 today.   Goal of Therapy:  Heparin level 0.3-0.7 units/ml Monitor platelets by anticoagulation protocol: Yes   Plan:  -Reduce heparin gtt to 1650 units/hr -Stop heparin gtt at the same time Xarelto is given -Start Xarelto 15 mg po bid x21 days thru 06/02/14, begin 20 mg po once daily on 06/03/14 -Monitor cbc   Hughes Better, PharmD, BCPS Clinical Pharmacist Pager: [phone] 05/13/2014 11:08 AM

## 2014-05-13 NOTE — Progress Notes (Addendum)
Subjective: Overnight, antibiotics were discontinued as the erythema was not thought to be related to another infection. O2 sats dropped to 88%, so he was put on 2L O2.  This AM, he was setting up in bed. He denies any new pain or worsening dyspnea.   I spoke with his PCP, Dr. Micheal Likens, and he doesn't mind switching to Xarelto. I also spoke with his son Education officer, community) over the phone, and he feels it is appropriate to change his father over to Xarelto despite the monthly co-pay of $30-40 given that it will save his father the frequent INR checks. He said he will be up here tomorrow.  Objective: Vital signs in last 24 hours: Filed Vitals:   05/12/14 2133 05/13/14 0021 05/13/14 0616 05/13/14 0643  BP: 111/60 118/73 96/54 99/59   Pulse:  88 72   Temp:  97.8 F (36.6 C) 97.7 F (36.5 C)   TempSrc:  Oral Oral   Resp:  20 13   Height:      Weight:      SpO2:  98% 98%    Weight change:   Intake/Output Summary (Last 24 hours) at 05/13/14 1115 Last data filed at 05/13/14 1003  Gross per 24 hour  Intake 592.16 ml  Output      0 ml  Net 592.16 ml   General: resting in bed, 2L O2 by [place] HEENT: PERRL, EOMI, no scleral icterus, oropharynx clear Cardiac: RRR, no rubs, murmurs or gallops Pulm: clear to auscultation bilaterally, no wheezes, rales, or rhonchi Abd: soft, nontender, nondistended, BS present Ext: R>L leg, erythematous lesion with less warmth than yesterday Neuro: responds to questions appropriately; moving all extremities freely   Lab Results: Basic Metabolic Panel:  Recent Labs Lab 05/12/14 1705 05/13/14 0220  NA 140 142  K 3.8 3.4*  CL 98 100  CO2 30 30  GLUCOSE 145* 178*  BUN 19 20  CREATININE 0.61 0.80  CALCIUM 9.2 8.8   Liver Function Tests:  Recent Labs Lab 05/12/14 1705  AST 28  ALT 16  ALKPHOS 118*  BILITOT 0.6  PROT 7.6  ALBUMIN 3.5   CBC:  Recent Labs Lab 05/12/14 1705 05/13/14 0220  WBC 11.4* 10.4  NEUTROABS 8.0*  --   HGB 14.0 13.3  HCT 41.5  39.5  MCV 95.2 97.3  PLT 175 182   CBG:  Recent Labs Lab 05/13/14 0025 05/13/14 0806  GLUCAP 183* 158*   Coagulation:  Recent Labs Lab 05/12/14 1705 05/13/14 0220  LABPROT 15.0 15.5*  INR 1.17 1.22   Micro Results: No results found for this or any previous visit (from the past 240 hour(s)). Studies/Results: No results found. Medications: I have reviewed the patient's current medications. Scheduled Meds: . aspirin EC  81 mg Oral Daily  . diltiazem  120 mg Oral Daily  . furosemide  40 mg Oral BID  . gabapentin  600 mg Oral TID PC & HS  . insulin aspart  20 Units Subcutaneous TID WC  . insulin glargine  40 Units Subcutaneous QHS  . metoCLOPramide  5 mg Oral TID AC  . rOPINIRole  2 mg Oral BID  . simvastatin  10 mg Oral QHS  . sotalol  80 mg Oral BID   Continuous Infusions: . heparin 1,750 Units/hr (05/12/14 1844)   PRN Meds:.traMADol Assessment/Plan:  Phillip Meyer is a 73 year old male with DM2, a-fib, CHF, HTN, HLD hospitalized for DVT with ambulation issues.  DVT: Currently on IV heparin with plan to transition to  Xarelto today.  -Recheck CBC tomorrow AM -Consult PT/OT to see if he needs Home Health PT given how much his back pain limits his range of motion.  DM2: CBGs trending 100s. Continue current regimen. -Continue Lantus 40u QHS & Novolog 20u TID -Continue home Reglan 5mg  & Requip 2mg  BID -Continue home gabapentin 600mg  QID  A-fib: Continue home sotalol.   HTN: BP trending 90-110/60-80s.  -Continue home enalapril, Lasix, diltiazem.  CHF: No signs of hypervolemia on exam. -Continue Lasix as noted above  HLD: Continue home Zocor.   #FEN:  -Diet: Carb Modified  #DVT prophylaxis: Xarelto  #CODE STATUS: FULL CODE -Would like a trial of CPR and intubation for 72 hours before considering supportive measures -Per him, son Phillip Meyer is HCPOA though sister Phillip Meyer may make decisions on his behalf while here in the hospital   Dispo: Disposition is deferred  at this time, awaiting improvement of current medical problems.  Anticipated discharge in approximately 1-2 day(s).   The patient does have a current PCP (Phillip Pang, MD) and does not need an [facility] hospital follow-up appointment after discharge.  The patient does have transportation limitations that hinder transportation to clinic appointments.  .Services Needed at time of discharge: Y = Yes, Blank = No PT:   OT:   RN:   Equipment:   Other:     LOS: 1 day   Charlott Rakes, MD 05/13/2014, 11:15 AM

## 2014-05-14 LAB — BASIC METABOLIC PANEL
Anion gap: 12 (ref 5–15)
BUN: 22 mg/dL (ref 6–23)
CO2: 26 meq/L (ref 19–32)
CREATININE: 0.79 mg/dL (ref 0.50–1.35)
Calcium: 8.8 mg/dL (ref 8.4–10.5)
Chloride: 98 mEq/L (ref 96–112)
GFR calc Af Amer: >90 mL/min (ref >90)
GFR calc non Af Amer: 87 mL/min — ABNORMAL LOW (ref >90)
Glucose, Bld: 148 mg/dL — ABNORMAL HIGH (ref 70–99)
Potassium: 4.6 mEq/L (ref 3.7–5.3)
Sodium: 136 mEq/L — ABNORMAL LOW (ref 137–147)

## 2014-05-14 LAB — CBC
HCT: 39.9 % (ref 39.0–52.0)
HCT: 41.5 % (ref 39.0–52.0)
HEMOGLOBIN: 13.6 g/dL (ref 13.0–17.0)
Hemoglobin: 12.7 g/dL — ABNORMAL LOW (ref 13.0–17.0)
MCH: 31.1 pg (ref 26.0–34.0)
MCH: 31.9 pg (ref 26.0–34.0)
MCHC: 31.8 g/dL (ref 30.0–36.0)
MCHC: 32.8 g/dL (ref 30.0–36.0)
MCV: 97.6 fL (ref 78.0–100.0)
MCV: 97.4 fL (ref 78.0–100.0)
Platelets: 209 10*3/uL (ref 150–400)
Platelets: 229 10*3/uL (ref 150–400)
RBC: 4.09 MIL/uL — ABNORMAL LOW (ref 4.22–5.81)
RBC: 4.26 MIL/uL (ref 4.22–5.81)
RDW: 13.3 % (ref 11.5–15.5)
RDW: 13.3 % (ref 11.5–15.5)
WBC: 8.9 10*3/uL (ref 4.0–10.5)
WBC: 8.2 10*3/uL (ref 4.0–10.5)

## 2014-05-14 LAB — GLUCOSE, CAPILLARY: Glucose-Capillary: 173 mg/dL — ABNORMAL HIGH (ref 70–99)

## 2014-05-14 MED ORDER — TRAMADOL HCL 50 MG PO TABS: 100.0000 mg | ORAL_TABLET | Freq: Four times a day (QID) | ORAL | Status: AC | PRN

## 2014-05-14 MED ORDER — RIVAROXABAN 15 MG PO TABS: 15.0000 mg | ORAL_TABLET | Freq: Two times a day (BID) | ORAL | Status: AC

## 2014-05-14 MED ORDER — GABAPENTIN 600 MG PO TABS
600.0000 mg | ORAL_TABLET | Freq: Three times a day (TID) | ORAL | Status: DC
  Administered 2014-05-14: 600 mg via ORAL
  Filled 2014-05-14 (×5): qty 1

## 2014-05-14 MED ORDER — RIVAROXABAN (XARELTO) EDUCATION KIT FOR DVT/PE PATIENTS
PACK | Freq: Once | Status: DC
  Filled 2014-05-14 (×2): qty 1

## 2014-05-14 MED ORDER — RIVAROXABAN 20 MG PO TABS: 20.0000 mg | ORAL_TABLET | Freq: Every day | ORAL | Status: DC

## 2014-05-14 MED ORDER — CETYLPYRIDINIUM CHLORIDE 0.05 % MT LIQD
7.0000 mL | Freq: Two times a day (BID) | OROMUCOSAL | Status: DC
  Administered 2014-05-14: 7 mL via OROMUCOSAL

## 2014-05-14 MED ORDER — POTASSIUM CHLORIDE CRYS ER 20 MEQ PO TBCR
40.0000 meq | EXTENDED_RELEASE_TABLET | Freq: Once | ORAL | Status: AC
  Administered 2014-05-14: 40 meq via ORAL
  Filled 2014-05-14: qty 2

## 2014-05-14 MED ORDER — RIVAROXABAN (XARELTO) EDUCATION KIT FOR DVT/PE PATIENTS: PACK | Status: DC

## 2014-05-14 NOTE — Progress Notes (Addendum)
Subjective: This AM, he reports some minor pain in his R leg that responds well to his Tramadol and feels as though it is improving. Otherwise, he denies any shortness of breath. We explained to him that he would no longer need any INR checks with this new medication.    Objective: Vital signs in last 24 hours: Filed Vitals:   05/13/14 0643 05/13/14 1343 05/13/14 2159 05/13/14 2229  BP: 99/59 96/48 138/84 119/61  Pulse:  72 94 54  Temp:  98.2 F (36.8 C)  98.2 F (36.8 C)  TempSrc:  Oral  Oral  Resp:  24  20  Height:      Weight:      SpO2:  91%  95%   Weight change:   Intake/Output Summary (Last 24 hours) at 05/14/14 0725 Last data filed at 05/13/14 2232  Gross per 24 hour  Intake 1122.33 ml  Output      0 ml  Net 1122.33 ml   General: resting in bed HEENT: PERRL, EOMI, no scleral icterus, oropharynx clear Cardiac: RRR, no rubs, murmurs or gallops Pulm: clear to auscultation bilaterally, no wheezes, rales, or rhonchi Abd: soft, nontender, nondistended, BS present Ext: R>L leg with erythema, trace pitting edema bilaterally Neuro: responds to questions appropriately; moving all extremities freely   Lab Results: Basic Metabolic Panel:  Recent Labs Lab 05/13/14 0220 05/14/14 0545  NA 142 136*  K 3.4* 4.6  CL 100 98  CO2 30 26  GLUCOSE 178* 148*  BUN 20 22  CREATININE 0.80 0.79  CALCIUM 8.8 8.8   Liver Function Tests:  Recent Labs Lab 05/12/14 1705  AST 28  ALT 16  ALKPHOS 118*  BILITOT 0.6  PROT 7.6  ALBUMIN 3.5   CBC:  Recent Labs Lab 05/12/14 1705  05/14/14 0545 05/14/14 1000  WBC 11.4*  < > 8.9 8.2  NEUTROABS 8.0*  --   --   --   HGB 14.0  < > 12.7* 13.6  HCT 41.5  < > 39.9 41.5  MCV 95.2  < > 97.6 97.4  PLT 175  < > 209 229  < > = values in this interval not displayed. CBG:  Recent Labs Lab 05/13/14 0025 05/13/14 0806 05/13/14 1157 05/13/14 1644 05/13/14 2224  GLUCAP 183* 158* 103* 114* 102*   Coagulation:  Recent  Labs Lab 05/12/14 1705 05/13/14 0220  LABPROT 15.0 15.5*  INR 1.17 1.22   Medications: I have reviewed the patient's current medications. Scheduled Meds: . antiseptic oral rinse  7 mL Mouth Rinse BID  . aspirin EC  81 mg Oral Daily  . diltiazem  120 mg Oral Daily  . furosemide  40 mg Oral BID  . gabapentin  600 mg Oral TID PC & HS  . insulin aspart  20 Units Subcutaneous TID WC  . insulin glargine  40 Units Subcutaneous QHS  . metoCLOPramide  5 mg Oral TID AC  . Rivaroxaban  15 mg Oral BID WC  . [START ON 06/03/2014] rivaroxaban  20 mg Oral Daily  . rOPINIRole  2 mg Oral BID  . simvastatin  10 mg Oral QHS  . sotalol  80 mg Oral BID   Continuous Infusions:   PRN Meds:.traMADol Assessment/Plan:  Phillip Meyer is a 73 year old male with DM2, a-fib, CHF, HTN, HLD hospitalized for DVT with ambulation issues found to have hypokalemia.  DVT: Tolerating Xarelto well. Hb stable at 12. Plan to discharge on this medication.  -Arrange for [facility]  PT per PT/OT recs  DM2: CBGs trending 100s. Resume home insulin regimen at discharge.  A-fib: Resume home sotalol on discharge.  HTN: BP trending normotensive. Resume home meds on discharge.   CHF: Some pitting edema on exam but not worse from admission. Resume home Lasix.  HLD: Resume home Zocor.   #FEN:  -Diet: Carb Modified -Replete Kdur 29mEq  #DVT prophylaxis: Xarelto  #CODE STATUS: FULL CODE -Would like a trial of CPR and intubation for 72 hours before considering supportive measures -Per him, son Phillip Meyer is HCPOA though sister [name] may make decisions on his behalf while here in the hospital   Dispo: Disposition is deferred at this time, awaiting improvement of current medical problems.  Anticipated discharge in approximately 1-2 day(s).   The patient does have a current PCP (Zorita Pang, MD) and does not need an [facility] hospital follow-up appointment after discharge.  The patient does have transportation limitations that  hinder transportation to clinic appointments.  .Services Needed at time of discharge: Y = Yes, Blank = No PT: Home Health PT  OT:   RN:   Equipment:   Other:     LOS: 2 days   Charlott Rakes, MD 05/14/2014, 1:51 PM

## 2014-05-14 NOTE — Discharge Summary (Signed)
Name: Phillip Meyer MRN: [id number] DOB: 01/20/1941 73 y.o. PCP: Zorita Pang, MD  Date of Admission: 05/12/2014  4:15 PM Date of Discharge: 05/14/2014 Attending Physician: Aldine Contes, MD  Discharge Diagnosis: Active Problems:   IDDM (insulin dependent diabetes mellitus)   DVT (deep venous thrombosis)  Discharge Medications:   Medication List    STOP taking these medications        warfarin 5 MG tablet  Commonly known as:  COUMADIN      TAKE these medications        APPLE CIDER VINEGAR PO  Take 200 mg by mouth 2 (two) times daily.     aspirin EC 81 MG tablet  Take 81 mg by mouth daily.     colchicine 0.6 MG tablet  Take 0.6 mg by mouth 4 (four) times daily.     diltiazem 120 MG 24 hr capsule  Commonly known as:  CARDIZEM CD  Take 120 mg by mouth daily.     enalapril 20 MG tablet  Commonly known as:  VASOTEC  Take 20 mg by mouth daily.     ferrous sulfate 325 (65 FE) MG tablet  Take 325 mg by mouth daily with breakfast.     Fish Oil 1200 MG Caps  Take 1 capsule by mouth 2 (two) times daily.     furosemide 40 MG tablet  Commonly known as:  LASIX  Take 40 mg by mouth 2 (two) times daily. 3 in the morning and 2 tablets at dinner     gabapentin 600 MG tablet  Commonly known as:  NEURONTIN  Take 600 mg by mouth 4 (four) times daily - after meals and at bedtime.     insulin glargine 100 UNIT/ML injection  Commonly known as:  LANTUS  Inject 60 Units into the skin at bedtime.     insulin regular 100 units/mL injection  Commonly known as:  NOVOLIN R,HUMULIN R  Inject 30 Units into the skin 3 (three) times daily before meals.     meclizine 25 MG tablet  Commonly known as:  ANTIVERT  Take 25 mg by mouth 4 (four) times daily.     metoCLOPramide 5 MG tablet  Commonly known as:  REGLAN  Take 5 mg by mouth 3 (three) times daily before meals.     OVER THE COUNTER MEDICATION  Take 2-3 tablets by mouth daily. Equate brand stool softener.  Take 2  tablets one day and 3 tablets the next day.     Rivaroxaban 15 MG Tabs tablet  Commonly known as:  XARELTO  Take 1 tablet (15 mg total) by mouth 2 (two) times daily with a meal.     rivaroxaban 20 MG Tabs tablet  Commonly known as:  XARELTO  Take 1 tablet (20 mg total) by mouth daily.  Start taking on:  06/03/2014     rivaroxaban Kit  Commonly known as:  XARELTO  Use as directed     rOPINIRole 2 MG tablet  Commonly known as:  REQUIP  Take 2 mg by mouth 2 (two) times daily.     simvastatin 10 MG tablet  Commonly known as:  ZOCOR  Take 10 mg by mouth at bedtime.     sotalol 80 MG tablet  Commonly known as:  BETAPACE  Take 80 mg by mouth 2 (two) times daily.     traMADol 50 MG tablet  Commonly known as:  ULTRAM  Take 100 mg by mouth every 6 (six) hours  as needed for pain.        Disposition and follow-up:   [name] was discharged from [facility] in Good condition.  At the hospital follow up visit please address:  1.  DVT: resolution, adherence of Xarelto  2.  Labs / imaging needed at time of follow-up: none  3.  Pending labs/ test needing follow-up: none  Follow-up Appointments:     Follow-up Information    Follow up with [facility].   Why:  HHPT   Contact information:   [address] [place] [address] [phone]       Follow up with Wende Neighbors, MD. Schedule an appointment as soon as possible for a visit in 1 week.   Specialty:  Family Medicine   Contact information:   [address] [place] [address] [phone]       Discharge Instructions: Discharge Instructions    Call MD for:  extreme fatigue    Complete by:  As directed      Call MD for:  persistant dizziness or light-headedness    Complete by:  As directed      Call MD for:  persistant nausea and vomiting    Complete by:  As directed      Call MD for:  redness, tenderness, or signs of infection (pain, swelling, redness, odor  or green/yellow discharge around incision site)    Complete by:  As directed      Diet - low sodium heart healthy    Complete by:  As directed      Diet - low sodium heart healthy    Complete by:  As directed      Increase activity slowly    Complete by:  As directed      Increase activity slowly    Complete by:  As directed            Consultations:    Procedures Performed:  No results found.  Admission HPI: Mr. Phillip Meyer is a 73 year old male with DM2, a-fib, CHF, HTN who presents with a painful, erythematous, swollen R leg. His sister Phillip Meyer) and neighbor were present at bedside during the time of interview.  Last Tuesday (11/14), he discontinued taking his coumadin in preparation for an epidural injection. On Friday (11/17), he went to his PCP's office after he noted R leg swelling. His PCP felt it was due to fluid and increased his diuretic dose. Swelling and pain worsened over the next few days to the point that he went to Urgent Care today. Dopplers done at [facility] showed a DVT, so he came to this hospital.  He takes coumadin for his atrial fibrillation. Most of his day is spent lying around and sitting outside on his porch as his gait limits his ability to ambulate. He follows with Dr. Saintclair Halsted for his back surgery with most recent one being back in April 2015 for removal of painful hardware. Otherwise, he denies any tobacco and EtOH use, recent leg trauma, previous cancer, previous DVT, family history of DVT, fever, shortness of breath, chest pain, dizziness.  In the ED, he was started on vancomycin and Zosyn as well as heparin.   Hospital Course by problem list:   DVT: He was started on IV heparin before transitioning to Xarelto. His PCP was contacted and was able to provide him with a 30-day supply. PT/OT was consulted for possibility of Home Health in the setting of  his reported gait instability but did not feel he warranted home Physical therapy. At the time of discharge,  he reported improvement in his leg pain and did not require oxygen. He was prescribed Tramadol 100mg  q6h (30 tablets) until he could follow-up with his PCP.  DM2: CBGs trended in the 100s on 2/3 his home regimen (Lantus 40 units QHS & Novolog 20 units TID). He was asked to resume his normal regimen at the time of discharge.  A-fib: Remained stable on home medications.  HTN: Remained stable on home medications.  CHF: Remained stable on home medications.  HLD: Remained stable on home medications.   Discharge Vitals:   BP 126/71 mmHg  Pulse 61  Temp(Src) 98.5 F (36.9 C) (Oral)  Resp 18  Ht 5\' 11"  (1.803 m)  Wt 281 lb (127.461 kg)  BMI 39.21 kg/m2  SpO2 96%  Discharge Labs:  Results for orders placed or performed during the hospital encounter of 05/12/14 (from the past 24 hour(s))  Glucose, capillary     Status: Abnormal   Collection Time: 05/13/14  4:44 PM  Result Value Ref Range   Glucose-Capillary 114 (H) 70 - 99 mg/dL  Glucose, capillary     Status: Abnormal   Collection Time: 05/13/14 10:24 PM  Result Value Ref Range   Glucose-Capillary 102 (H) 70 - 99 mg/dL   Comment 1 Notify RN    Comment 2 Documented in Chart   CBC     Status: Abnormal   Collection Time: 05/14/14  5:45 AM  Result Value Ref Range   WBC 8.9 4.0 - 10.5 K/uL   RBC 4.09 (L) 4.22 - 5.81 MIL/uL   Hemoglobin 12.7 (L) 13.0 - 17.0 g/dL   HCT 39.9 39.0 - 52.0 %   MCV 97.6 78.0 - 100.0 fL   MCH 31.1 26.0 - 34.0 pg   MCHC 31.8 30.0 - 36.0 g/dL   RDW 13.3 11.5 - 15.5 %   Platelets 209 150 - 400 K/uL  Basic metabolic panel     Status: Abnormal   Collection Time: 05/14/14  5:45 AM  Result Value Ref Range   Sodium 136 (L) 137 - 147 mEq/L   Potassium 4.6 3.7 - 5.3 mEq/L   Chloride 98 96 - 112 mEq/L   CO2 26 19 - 32 mEq/L   Glucose, Bld 148 (H) 70 - 99 mg/dL   BUN 22 6 - 23 mg/dL   Creatinine, Ser 0.79 0.50 - 1.35 mg/dL   Calcium 8.8 8.4 - 10.5 mg/dL   GFR calc non Af Amer 87 (L) >90 mL/min   GFR calc Af  Amer >90 >90 mL/min   Anion gap 12 5 - 15  Glucose, capillary     Status: Abnormal   Collection Time: 05/14/14  9:02 AM  Result Value Ref Range   Glucose-Capillary 173 (H) 70 - 99 mg/dL  CBC     Status: None   Collection Time: 05/14/14 10:00 AM  Result Value Ref Range   WBC 8.2 4.0 - 10.5 K/uL   RBC 4.26 4.22 - 5.81 MIL/uL   Hemoglobin 13.6 13.0 - 17.0 g/dL   HCT 41.5 39.0 - 52.0 %   MCV 97.4 78.0 - 100.0 fL   MCH 31.9 26.0 - 34.0 pg   MCHC 32.8 30.0 - 36.0 g/dL   RDW 13.3 11.5 - 15.5 %   Platelets 229 150 - 400 K/uL    Signed: Charlott Rakes, MD 05/20/2014, 10:27 AM    Services Ordered on  Discharge: None Equipment Ordered on Discharge: None

## 2014-05-14 NOTE — Plan of Care (Signed)
Problem: Consults Goal: General Medical Patient Education See Patient Education Module for specific education. Outcome: Completed/Met Date Met:  05/14/14 Goal: Nutrition Consult-if indicated Outcome: Not Applicable Date Met:  47/09/29  Problem: Phase I Progression Outcomes Goal: Pain controlled with appropriate interventions Outcome: Progressing Goal: OOB as tolerated unless otherwise ordered Outcome: Progressing Goal: Voiding-avoid urinary catheter unless indicated Outcome: Not Applicable Date Met:  57/47/34 Goal: Hemodynamically stable Outcome: Completed/Met Date Met:  05/14/14  Problem: Phase II Progression Outcomes Goal: Progress activity as tolerated unless otherwise ordered Outcome: Progressing Goal: Vital signs remain stable Outcome: Completed/Met Date Met:  05/14/14 Goal: IV changed to normal saline lock Outcome: Completed/Met Date Met:  05/14/14 Goal: Obtain order to discontinue catheter if appropriate Outcome: Not Applicable Date Met:  03/70/96  Problem: Phase III Progression Outcomes Goal: Pain controlled on oral analgesia Outcome: Progressing Goal: Foley discontinued Outcome: Not Applicable Date Met:  43/83/81  Problem: Discharge Progression Outcomes Goal: Pain controlled with appropriate interventions Outcome: Progressing Goal: Hemodynamically stable Outcome: Completed/Met Date Met:  05/14/14 Goal: Tolerating diet Outcome: Completed/Met Date Met:  05/14/14

## 2014-05-14 NOTE — Progress Notes (Signed)
Patient was given prescription for tramadol to take to his pharmacy.

## 2014-05-14 NOTE — Plan of Care (Signed)
Problem: Discharge Progression Outcomes Goal: Discharge plan in place and appropriate Outcome: Completed/Met Date Met:  05/14/14 Goal: Pain controlled with appropriate interventions Outcome: Adequate for Discharge Goal: Activity appropriate for discharge plan Outcome: Adequate for Discharge

## 2014-05-14 NOTE — Progress Notes (Signed)
Pt seen and examined with Dr. Posey Pronto.   Pt feels well today. No new complaints. Seen by PT. Decreased pain in R LE  Physical Exam: Gen: AAO*3, NAD CVS: RRR, normal heart sounds Pulm: CTA b/l Abd: soft, non tender, BS + Ext: R LE edema mildly improved  Assessment and Plan: 73 y/o male with R LE DVT  DVT: - c/w xarelto PO - h/h stable on CBC. No further w/u for now - Outpatient f/u with PCP  DM: - c.w current regimen - BS in the 100s   Afib: - c/w xarelto and sotalol   Stable for d/c home today with Home health care

## 2014-05-14 NOTE — Progress Notes (Signed)
AVS discharge instructions were reviewed with patient and his grandson. Patient stated that he did not have any questions. Volunteer will  be called to assist patient to his transportation.

## 2014-05-14 NOTE — Evaluation (Signed)
Physical Therapy Evaluation Patient Details Name: Phillip Meyer MRN: [id number] DOB: [birth year] Today's Date: 05/14/2014   History of Present Illness  pt admitted with R LE pain and swelling found to be due to DVT.  Pt therapeutic on anticoagulant  Clinical Impression  Pt is at or approaching baseline function, but displays some core weaknesses than would warrant HHPT to help pt with postural and gait stability.  Will sign off from acute PT at this time.    Follow Up Recommendations Home health PT    Equipment Recommendations  None recommended by PT    Recommendations for Other Services       Precautions / Restrictions Precautions Precautions:  (minimal fall risk) Restrictions Weight Bearing Restrictions: No      Mobility  Bed Mobility Overal bed mobility: Independent                Transfers Overall transfer level: Modified independent               General transfer comment: safe technique  Ambulation/Gait Ambulation/Gait assistance: Modified independent (Device/Increase time) Ambulation Distance (Feet): 250 Feet Assistive device: Rolling walker (2 wheeled) Gait Pattern/deviations: Step-through pattern Gait velocity: moderate speed Gait velocity interpretation: Below normal speed for age/gender General Gait Details: mildly staggered and steppage gait on L due to drop foot.  Flexed posture.  Generally safe.  Stairs Stairs: Yes Stairs assistance: Modified independent (Device/Increase time) Stair Management: Two rails;Alternating pattern;Step to pattern;Forwards Number of Stairs: 5 General stair comments: safe with rails  Wheelchair Mobility    Modified Rankin (Stroke Patients Only)       Balance Overall balance assessment: No apparent balance deficits (not formally assessed)                                           Pertinent Vitals/Pain Pain Assessment: No/denies pain (with meds on board)    [place] expects to be discharged to:: Private residence Living Arrangements: Alone Available Help at Discharge: Available PRN/intermittently Type of Home: House Home Access: Stairs to enter;Ramped entrance Entrance Stairs-Rails: Right;Left Entrance Stairs-Number of Steps: 2 Home Layout: Two level;Laundry or work area in basement;Able to live on main level with bedroom/bathroom Home Equipment: Environmental consultant - 2 wheels;Cane - quad;Shower seat - built in;Grab bars - toilet;Grab bars - tub/shower (handicapped toilets) Additional Comments: gson in for the holiday season.    Prior Function Level of Independence: Independent with assistive device(s)               Hand Dominance        Extremity/Trunk Assessment               Lower Extremity Assessment: Overall WFL for tasks assessed;LLE deficits/detail   LLE Deficits / Details: foot drop     Communication   Communication: No difficulties  Cognition Arousal/Alertness: Awake/alert Behavior During Therapy: WFL for tasks assessed/performed Overall Cognitive Status: Within Functional Limits for tasks assessed                      General Comments      Exercises        Assessment/Plan    PT Assessment All further PT needs can be met in the next venue of care  PT Diagnosis Abnormality of gait;Other (comment) (Core weakness)   PT Problem List Decreased activity tolerance;Pain;Decreased mobility;Other (comment) (Decrease core  muscle strength)  PT Treatment Interventions     PT Goals (Current goals can be found in the Care Plan section) Acute Rehab PT Goals PT Goal Formulation: All assessment and education complete, DC therapy    Frequency     Barriers to discharge        Co-evaluation               End of Session   Activity Tolerance: Patient tolerated treatment well Patient left: in bed;with call bell/phone within reach (sitting EOB) Nurse Communication: Mobility status         Time:  0159-9689 PT Time Calculation (min) (ACUTE ONLY): 27 min   Charges:   PT Evaluation $Initial PT Evaluation Tier I: 1 Procedure PT Treatments $Gait Training: 8-22 mins   PT G Codes:          Nalin Mazzocco, Tessie Fass 05/14/2014, 10:33 AM 05/14/2014  Donnella Sham, PT [phone]  (pager)

## 2014-05-19 LAB — CULTURE, BLOOD (ROUTINE X 2)
CULTURE: NO GROWTH
Culture: NO GROWTH

## 2014-07-17 ENCOUNTER — Other Ambulatory Visit: Payer: Self-pay | Admitting: Neurosurgery

## 2014-07-17 DIAGNOSIS — M48061 Spinal stenosis, lumbar region without neurogenic claudication: Secondary | ICD-10-CM

## 2014-09-03 ENCOUNTER — Emergency Department (HOSPITAL_COMMUNITY): Payer: Medicare Other

## 2014-09-03 ENCOUNTER — Encounter (HOSPITAL_COMMUNITY): Payer: Self-pay | Admitting: Family Medicine

## 2014-09-03 ENCOUNTER — Inpatient Hospital Stay (HOSPITAL_COMMUNITY)
Admission: EM | Admit: 2014-09-03 | Discharge: 2014-09-19 | DRG: 843 | Disposition: E | Payer: Medicare Other | Attending: Internal Medicine | Admitting: Internal Medicine

## 2014-09-03 DIAGNOSIS — I428 Other cardiomyopathies: Secondary | ICD-10-CM | POA: Diagnosis present

## 2014-09-03 DIAGNOSIS — R0600 Dyspnea, unspecified: Secondary | ICD-10-CM

## 2014-09-03 DIAGNOSIS — Z794 Long term (current) use of insulin: Secondary | ICD-10-CM | POA: Diagnosis not present

## 2014-09-03 DIAGNOSIS — I10 Essential (primary) hypertension: Secondary | ICD-10-CM | POA: Diagnosis present

## 2014-09-03 DIAGNOSIS — I2119 ST elevation (STEMI) myocardial infarction involving other coronary artery of inferior wall: Secondary | ICD-10-CM | POA: Diagnosis not present

## 2014-09-03 DIAGNOSIS — C800 Disseminated malignant neoplasm, unspecified: Principal | ICD-10-CM | POA: Diagnosis present

## 2014-09-03 DIAGNOSIS — R0981 Nasal congestion: Secondary | ICD-10-CM | POA: Diagnosis present

## 2014-09-03 DIAGNOSIS — I8002 Phlebitis and thrombophlebitis of superficial vessels of left lower extremity: Secondary | ICD-10-CM | POA: Diagnosis present

## 2014-09-03 DIAGNOSIS — J90 Pleural effusion, not elsewhere classified: Secondary | ICD-10-CM

## 2014-09-03 DIAGNOSIS — I2699 Other pulmonary embolism without acute cor pulmonale: Secondary | ICD-10-CM | POA: Diagnosis present

## 2014-09-03 DIAGNOSIS — C220 Liver cell carcinoma: Secondary | ICD-10-CM | POA: Diagnosis present

## 2014-09-03 DIAGNOSIS — R230 Cyanosis: Secondary | ICD-10-CM | POA: Diagnosis not present

## 2014-09-03 DIAGNOSIS — E11649 Type 2 diabetes mellitus with hypoglycemia without coma: Secondary | ICD-10-CM | POA: Diagnosis present

## 2014-09-03 DIAGNOSIS — I472 Ventricular tachycardia: Secondary | ICD-10-CM | POA: Diagnosis present

## 2014-09-03 DIAGNOSIS — E119 Type 2 diabetes mellitus without complications: Secondary | ICD-10-CM

## 2014-09-03 DIAGNOSIS — I493 Ventricular premature depolarization: Secondary | ICD-10-CM | POA: Diagnosis not present

## 2014-09-03 DIAGNOSIS — D630 Anemia in neoplastic disease: Secondary | ICD-10-CM | POA: Diagnosis present

## 2014-09-03 DIAGNOSIS — I998 Other disorder of circulatory system: Secondary | ICD-10-CM | POA: Diagnosis not present

## 2014-09-03 DIAGNOSIS — Z888 Allergy status to other drugs, medicaments and biological substances status: Secondary | ICD-10-CM

## 2014-09-03 DIAGNOSIS — J189 Pneumonia, unspecified organism: Secondary | ICD-10-CM

## 2014-09-03 DIAGNOSIS — R200 Anesthesia of skin: Secondary | ICD-10-CM | POA: Diagnosis present

## 2014-09-03 DIAGNOSIS — I4891 Unspecified atrial fibrillation: Secondary | ICD-10-CM | POA: Diagnosis not present

## 2014-09-03 DIAGNOSIS — Z807 Family history of other malignant neoplasms of lymphoid, hematopoietic and related tissues: Secondary | ICD-10-CM | POA: Diagnosis not present

## 2014-09-03 DIAGNOSIS — C9000 Multiple myeloma not having achieved remission: Secondary | ICD-10-CM | POA: Diagnosis present

## 2014-09-03 DIAGNOSIS — C787 Secondary malignant neoplasm of liver and intrahepatic bile duct: Secondary | ICD-10-CM | POA: Diagnosis not present

## 2014-09-03 DIAGNOSIS — I5042 Chronic combined systolic (congestive) and diastolic (congestive) heart failure: Secondary | ICD-10-CM | POA: Diagnosis not present

## 2014-09-03 DIAGNOSIS — M21372 Foot drop, left foot: Secondary | ICD-10-CM | POA: Diagnosis present

## 2014-09-03 DIAGNOSIS — E785 Hyperlipidemia, unspecified: Secondary | ICD-10-CM | POA: Diagnosis present

## 2014-09-03 DIAGNOSIS — I82409 Acute embolism and thrombosis of unspecified deep veins of unspecified lower extremity: Secondary | ICD-10-CM | POA: Diagnosis not present

## 2014-09-03 DIAGNOSIS — R0902 Hypoxemia: Secondary | ICD-10-CM | POA: Diagnosis present

## 2014-09-03 DIAGNOSIS — C801 Malignant (primary) neoplasm, unspecified: Secondary | ICD-10-CM | POA: Diagnosis not present

## 2014-09-03 DIAGNOSIS — I82413 Acute embolism and thrombosis of femoral vein, bilateral: Secondary | ICD-10-CM

## 2014-09-03 DIAGNOSIS — I429 Cardiomyopathy, unspecified: Secondary | ICD-10-CM | POA: Diagnosis present

## 2014-09-03 DIAGNOSIS — I480 Paroxysmal atrial fibrillation: Secondary | ICD-10-CM | POA: Diagnosis present

## 2014-09-03 DIAGNOSIS — Z66 Do not resuscitate: Secondary | ICD-10-CM | POA: Diagnosis present

## 2014-09-03 DIAGNOSIS — Z6841 Body Mass Index (BMI) 40.0 and over, adult: Secondary | ICD-10-CM

## 2014-09-03 DIAGNOSIS — K769 Liver disease, unspecified: Secondary | ICD-10-CM

## 2014-09-03 DIAGNOSIS — R609 Edema, unspecified: Secondary | ICD-10-CM | POA: Diagnosis not present

## 2014-09-03 DIAGNOSIS — M109 Gout, unspecified: Secondary | ICD-10-CM | POA: Diagnosis present

## 2014-09-03 DIAGNOSIS — I999 Unspecified disorder of circulatory system: Secondary | ICD-10-CM | POA: Diagnosis not present

## 2014-09-03 DIAGNOSIS — C799 Secondary malignant neoplasm of unspecified site: Secondary | ICD-10-CM | POA: Diagnosis not present

## 2014-09-03 DIAGNOSIS — I5022 Chronic systolic (congestive) heart failure: Secondary | ICD-10-CM

## 2014-09-03 DIAGNOSIS — R160 Hepatomegaly, not elsewhere classified: Secondary | ICD-10-CM

## 2014-09-03 DIAGNOSIS — Z8249 Family history of ischemic heart disease and other diseases of the circulatory system: Secondary | ICD-10-CM | POA: Diagnosis not present

## 2014-09-03 DIAGNOSIS — I5043 Acute on chronic combined systolic (congestive) and diastolic (congestive) heart failure: Secondary | ICD-10-CM | POA: Diagnosis present

## 2014-09-03 DIAGNOSIS — I5020 Unspecified systolic (congestive) heart failure: Secondary | ICD-10-CM | POA: Diagnosis not present

## 2014-09-03 DIAGNOSIS — E874 Mixed disorder of acid-base balance: Secondary | ICD-10-CM | POA: Diagnosis present

## 2014-09-03 DIAGNOSIS — D509 Iron deficiency anemia, unspecified: Secondary | ICD-10-CM | POA: Diagnosis present

## 2014-09-03 DIAGNOSIS — I509 Heart failure, unspecified: Secondary | ICD-10-CM | POA: Diagnosis not present

## 2014-09-03 DIAGNOSIS — R918 Other nonspecific abnormal finding of lung field: Secondary | ICD-10-CM | POA: Diagnosis not present

## 2014-09-03 DIAGNOSIS — I213 ST elevation (STEMI) myocardial infarction of unspecified site: Secondary | ICD-10-CM | POA: Diagnosis not present

## 2014-09-03 DIAGNOSIS — Z7901 Long term (current) use of anticoagulants: Secondary | ICD-10-CM | POA: Diagnosis not present

## 2014-09-03 DIAGNOSIS — C259 Malignant neoplasm of pancreas, unspecified: Secondary | ICD-10-CM | POA: Diagnosis present

## 2014-09-03 DIAGNOSIS — E873 Alkalosis: Secondary | ICD-10-CM | POA: Diagnosis present

## 2014-09-03 DIAGNOSIS — J9601 Acute respiratory failure with hypoxia: Secondary | ICD-10-CM | POA: Diagnosis present

## 2014-09-03 DIAGNOSIS — IMO0001 Reserved for inherently not codable concepts without codable children: Secondary | ICD-10-CM

## 2014-09-03 DIAGNOSIS — I340 Nonrheumatic mitral (valve) insufficiency: Secondary | ICD-10-CM | POA: Diagnosis present

## 2014-09-03 HISTORY — DX: Unspecified atrial fibrillation: I48.91

## 2014-09-03 HISTORY — DX: Multiple myeloma not having achieved remission: C90.00

## 2014-09-03 HISTORY — DX: Disseminated malignant neoplasm, unspecified: C80.0

## 2014-09-03 HISTORY — DX: Pneumonia, unspecified organism: J18.9

## 2014-09-03 HISTORY — DX: Malignant (primary) neoplasm, unspecified: C80.1

## 2014-09-03 LAB — COMPREHENSIVE METABOLIC PANEL
ALT: 15 U/L (ref 0–53)
AST: 42 U/L — ABNORMAL HIGH (ref 0–37)
Albumin: 2.7 g/dL — ABNORMAL LOW (ref 3.5–5.2)
Alkaline Phosphatase: 101 U/L (ref 39–117)
Anion gap: 9 (ref 5–15)
BILIRUBIN TOTAL: 1.4 mg/dL — AB (ref 0.3–1.2)
BUN: 18 mg/dL (ref 6–23)
CALCIUM: 8.4 mg/dL (ref 8.4–10.5)
CO2: 30 mmol/L (ref 19–32)
CREATININE: 0.9 mg/dL (ref 0.50–1.35)
Chloride: 98 mmol/L (ref 96–112)
GFR calc Af Amer: >90 mL/min (ref >90)
GFR, EST NON AFRICAN AMERICAN: 82 mL/min — AB (ref >90)
Glucose, Bld: 155 mg/dL — ABNORMAL HIGH (ref 70–99)
Potassium: 3.7 mmol/L (ref 3.5–5.1)
Sodium: 137 mmol/L (ref 135–145)
Total Protein: 6.4 g/dL (ref 6.0–8.3)

## 2014-09-03 LAB — CBG MONITORING, ED
GLUCOSE-CAPILLARY: 162 mg/dL — AB (ref 70–99)
Glucose-Capillary: 70 mg/dL (ref 70–99)
Glucose-Capillary: 70 mg/dL (ref 70–99)
Glucose-Capillary: 94 mg/dL (ref 70–99)

## 2014-09-03 LAB — GLUCOSE, CAPILLARY
GLUCOSE-CAPILLARY: 227 mg/dL — AB (ref 70–99)
Glucose-Capillary: 171 mg/dL — ABNORMAL HIGH (ref 70–99)
Glucose-Capillary: 199 mg/dL — ABNORMAL HIGH (ref 70–99)

## 2014-09-03 LAB — STREP PNEUMONIAE URINARY ANTIGEN: Strep Pneumo Urinary Antigen: NEGATIVE

## 2014-09-03 LAB — CBC
HEMATOCRIT: 38.8 % — AB (ref 39.0–52.0)
HEMOGLOBIN: 12.7 g/dL — AB (ref 13.0–17.0)
MCH: 31.1 pg (ref 26.0–34.0)
MCHC: 32.7 g/dL (ref 30.0–36.0)
MCV: 95.1 fL (ref 78.0–100.0)
Platelets: 206 10*3/uL (ref 150–400)
RBC: 4.08 MIL/uL — ABNORMAL LOW (ref 4.22–5.81)
RDW: 13.9 % (ref 11.5–15.5)
WBC: 19.2 10*3/uL — ABNORMAL HIGH (ref 4.0–10.5)

## 2014-09-03 LAB — PROTIME-INR
INR: 2.51 — AB (ref 0.00–1.49)
Prothrombin Time: 27.3 seconds — ABNORMAL HIGH (ref 11.6–15.2)

## 2014-09-03 LAB — TROPONIN I
TROPONIN I: 0.03 ng/mL (ref <0.031)
Troponin I: <0.03 ng/mL (ref <0.031)
Troponin I: 0.03 ng/mL (ref <0.031)

## 2014-09-03 LAB — MAGNESIUM: MAGNESIUM: 2.2 mg/dL (ref 1.5–2.5)

## 2014-09-03 LAB — BRAIN NATRIURETIC PEPTIDE: B Natriuretic Peptide: 270.5 pg/mL — ABNORMAL HIGH (ref 0.0–100.0)

## 2014-09-03 MED ORDER — ADULT MULTIVITAMIN W/MINERALS CH
1.0000 | ORAL_TABLET | Freq: Every day | ORAL | Status: DC
  Administered 2014-09-04 – 2014-09-15 (×12): 1 via ORAL
  Filled 2014-09-03 (×15): qty 1

## 2014-09-03 MED ORDER — INSULIN ASPART 100 UNIT/ML ~~LOC~~ SOLN
0.0000 [IU] | Freq: Every day | SUBCUTANEOUS | Status: DC
  Administered 2014-09-08 – 2014-09-11 (×2): 2 [IU] via SUBCUTANEOUS
  Administered 2014-09-14: 3 [IU] via SUBCUTANEOUS
  Administered 2014-09-15: 2 [IU] via SUBCUTANEOUS

## 2014-09-03 MED ORDER — WARFARIN - PHARMACIST DOSING INPATIENT
Freq: Every day | Status: DC
  Administered 2014-09-03: 19:00:00

## 2014-09-03 MED ORDER — INSULIN GLARGINE 100 UNIT/ML ~~LOC~~ SOLN
45.0000 [IU] | Freq: Every day | SUBCUTANEOUS | Status: DC
  Administered 2014-09-03 – 2014-09-05 (×3): 45 [IU] via SUBCUTANEOUS
  Filled 2014-09-03 (×4): qty 0.45

## 2014-09-03 MED ORDER — ASPIRIN 81 MG PO CHEW
243.0000 mg | CHEWABLE_TABLET | Freq: Once | ORAL | Status: AC
  Administered 2014-09-03: 243 mg via ORAL

## 2014-09-03 MED ORDER — AZITHROMYCIN 500 MG IV SOLR
500.0000 mg | Freq: Once | INTRAVENOUS | Status: AC
  Administered 2014-09-03: 500 mg via INTRAVENOUS
  Filled 2014-09-03: qty 500

## 2014-09-03 MED ORDER — FERROUS SULFATE 325 (65 FE) MG PO TABS
325.0000 mg | ORAL_TABLET | Freq: Every day | ORAL | Status: DC
  Administered 2014-09-04 – 2014-09-12 (×9): 325 mg via ORAL
  Filled 2014-09-03 (×11): qty 1

## 2014-09-03 MED ORDER — ROPINIROLE HCL 1 MG PO TABS
2.0000 mg | ORAL_TABLET | Freq: Two times a day (BID) | ORAL | Status: DC
  Administered 2014-09-03 – 2014-09-15 (×25): 2 mg via ORAL
  Filled 2014-09-03 (×26): qty 2

## 2014-09-03 MED ORDER — MORPHINE SULFATE 2 MG/ML IJ SOLN
2.0000 mg | INTRAMUSCULAR | Status: DC | PRN
  Administered 2014-09-04 – 2014-09-05 (×2): 2 mg via INTRAVENOUS
  Filled 2014-09-03 (×2): qty 1

## 2014-09-03 MED ORDER — GABAPENTIN 600 MG PO TABS
600.0000 mg | ORAL_TABLET | Freq: Two times a day (BID) | ORAL | Status: DC
  Administered 2014-09-03 – 2014-09-15 (×26): 600 mg via ORAL
  Filled 2014-09-03 (×27): qty 1

## 2014-09-03 MED ORDER — WARFARIN SODIUM 5 MG PO TABS
5.0000 mg | ORAL_TABLET | Freq: Once | ORAL | Status: AC
  Filled 2014-09-03: qty 1

## 2014-09-03 MED ORDER — DILTIAZEM HCL ER COATED BEADS 120 MG PO CP24
120.0000 mg | ORAL_CAPSULE | Freq: Every day | ORAL | Status: DC
  Filled 2014-09-03: qty 1

## 2014-09-03 MED ORDER — CEFTRIAXONE SODIUM IN DEXTROSE 20 MG/ML IV SOLN
1.0000 g | INTRAVENOUS | Status: DC
  Administered 2014-09-04 – 2014-09-08 (×5): 1 g via INTRAVENOUS
  Filled 2014-09-03 (×5): qty 50

## 2014-09-03 MED ORDER — INSULIN ASPART 100 UNIT/ML ~~LOC~~ SOLN
15.0000 [IU] | Freq: Three times a day (TID) | SUBCUTANEOUS | Status: DC
  Administered 2014-09-03 – 2014-09-06 (×6): 15 [IU] via SUBCUTANEOUS

## 2014-09-03 MED ORDER — MULTI-VITAMIN/MINERALS PO TABS: 1.0000 | ORAL_TABLET | Freq: Every day | ORAL | Status: DC

## 2014-09-03 MED ORDER — SIMVASTATIN 10 MG PO TABS
10.0000 mg | ORAL_TABLET | Freq: Every day | ORAL | Status: DC
  Administered 2014-09-03 – 2014-09-15 (×13): 10 mg via ORAL
  Filled 2014-09-03 (×13): qty 1

## 2014-09-03 MED ORDER — AZITHROMYCIN 250 MG PO TABS
500.0000 mg | ORAL_TABLET | ORAL | Status: DC
  Administered 2014-09-04 – 2014-09-08 (×5): 500 mg via ORAL
  Filled 2014-09-03 (×5): qty 2

## 2014-09-03 MED ORDER — DEXTROSE 5 % IV SOLN
1.0000 g | Freq: Once | INTRAVENOUS | Status: AC
  Administered 2014-09-03: 1 g via INTRAVENOUS
  Filled 2014-09-03: qty 10

## 2014-09-03 MED ORDER — ASPIRIN 81 MG PO CHEW
324.0000 mg | CHEWABLE_TABLET | Freq: Once | ORAL | Status: DC
  Filled 2014-09-03: qty 4

## 2014-09-03 MED ORDER — INSULIN ASPART 100 UNIT/ML ~~LOC~~ SOLN
0.0000 [IU] | Freq: Three times a day (TID) | SUBCUTANEOUS | Status: DC
  Administered 2014-09-03: 15 [IU] via SUBCUTANEOUS
  Administered 2014-09-04 – 2014-09-05 (×4): 3 [IU] via SUBCUTANEOUS
  Administered 2014-09-05: 2 [IU] via SUBCUTANEOUS
  Administered 2014-09-05: 5 [IU] via SUBCUTANEOUS
  Administered 2014-09-06 (×2): 3 [IU] via SUBCUTANEOUS
  Administered 2014-09-07: 2 [IU] via SUBCUTANEOUS
  Administered 2014-09-07: 5 [IU] via SUBCUTANEOUS
  Administered 2014-09-08 – 2014-09-09 (×2): 3 [IU] via SUBCUTANEOUS
  Administered 2014-09-10: 2 [IU] via SUBCUTANEOUS
  Administered 2014-09-10: 3 [IU] via SUBCUTANEOUS
  Administered 2014-09-11: 5 [IU] via SUBCUTANEOUS
  Administered 2014-09-11 – 2014-09-13 (×2): 2 [IU] via SUBCUTANEOUS
  Administered 2014-09-13 – 2014-09-15 (×5): 3 [IU] via SUBCUTANEOUS
  Administered 2014-09-15: 8 [IU] via SUBCUTANEOUS
  Administered 2014-09-15: 5 [IU] via SUBCUTANEOUS

## 2014-09-03 MED ORDER — DOCUSATE SODIUM 100 MG PO CAPS
200.0000 mg | ORAL_CAPSULE | Freq: Every day | ORAL | Status: DC
  Administered 2014-09-04 – 2014-09-15 (×11): 200 mg via ORAL
  Filled 2014-09-03 (×16): qty 2

## 2014-09-03 MED ORDER — SODIUM CHLORIDE 0.9 % IV SOLN: INTRAVENOUS | Status: DC

## 2014-09-03 MED ORDER — SODIUM CHLORIDE 0.9 % IJ SOLN
3.0000 mL | Freq: Two times a day (BID) | INTRAMUSCULAR | Status: DC
  Administered 2014-09-03 – 2014-09-15 (×17): 3 mL via INTRAVENOUS

## 2014-09-03 MED ORDER — METOCLOPRAMIDE HCL 5 MG PO TABS
5.0000 mg | ORAL_TABLET | Freq: Three times a day (TID) | ORAL | Status: DC
  Administered 2014-09-03 – 2014-09-15 (×36): 5 mg via ORAL
  Filled 2014-09-03 (×38): qty 1

## 2014-09-03 MED ORDER — ALBUTEROL SULFATE (2.5 MG/3ML) 0.083% IN NEBU: 2.5000 mg | INHALATION_SOLUTION | RESPIRATORY_TRACT | Status: AC | PRN

## 2014-09-03 MED ORDER — HYDROMORPHONE HCL 1 MG/ML IJ SOLN: 0.5000 mg | INTRAMUSCULAR | Status: DC | PRN

## 2014-09-03 MED ORDER — COLCHICINE 0.6 MG PO TABS
0.6000 mg | ORAL_TABLET | Freq: Two times a day (BID) | ORAL | Status: DC
  Administered 2014-09-03 (×2): 0.6 mg via ORAL
  Filled 2014-09-03 (×2): qty 1

## 2014-09-03 MED ORDER — HYDROMORPHONE HCL 1 MG/ML IJ SOLN
0.5000 mg | Freq: Once | INTRAMUSCULAR | Status: AC
  Administered 2014-09-03: 0.5 mg via INTRAVENOUS
  Filled 2014-09-03: qty 1

## 2014-09-03 MED ORDER — ASPIRIN EC 81 MG PO TBEC
81.0000 mg | DELAYED_RELEASE_TABLET | Freq: Every day | ORAL | Status: DC
  Administered 2014-09-04: 81 mg via ORAL
  Filled 2014-09-03 (×2): qty 1

## 2014-09-03 NOTE — H&P (Signed)
Date: 09/02/2014               Patient Name:  Phillip Meyer MRN: [id number]  DOB: [birth year] Age / Sex: 74 y.o., male   PCP: Zorita Pang, MD         Medical Service: Internal Medicine Teaching Service         Attending Physician: Dr. Madilyn Fireman, MD    First Contact: Dr. Raelene Bott Pager: [phone]  Second Contact: Dr. Heber Isola Pager: [phone]       After Hours (After 5p/  First Contact Pager: [phone]  weekends / holidays): Second Contact Pager: [phone]   Chief Complaint: Chest Pain  History of Present Illness:   Patient is a 74 year old male with a history of type 2 diabetes, hypertension, congestive heart failure, hyperlipidemia, gout who presents to the hospital for chest and abdominal pain made worse with breathing.  Patient reports chest and abdominal pain with deep breathing that started around 2 to 3 days ago. He reports having a cough that has improved over interval. He states that his cough had been productive of yellow phlegm. He does report associated dypsnea as well as some nausea with no vomiting. He also reports associated rhinorrhea and loss of appetite. He reports runny stools though he takes a stool softener at home. He denies any fevers, chills, sick contacts.   Overall, patient has not felt right in the last two weeks. His blood sugars have been labile during this period. Yesterday, he had an episode of hypoglycemia down to the 29s yesterday and his sister stated that there was a period when patient lost consciousness. EMS was able to reportedly administer glucagon in the field. She reports that the blood glucose was in the 20s at that time. Patient refused transportation to the hospital at that time.    Meds:  (Not in a hospital admission) Current Facility-Administered Medications  Medication Dose Route Frequency Provider Last Rate Last Dose  . 0.9 %  sodium chloride infusion   Intravenous STAT Carmin Muskrat, MD      . albuterol (PROVENTIL) (2.5 MG/3ML) 0.083%  nebulizer solution 2.5 mg  2.5 mg Nebulization Q4H PRN Carmin Muskrat, MD      . HYDROmorphone (DILAUDID) injection 0.5 mg  0.5 mg Intravenous Q4H PRN Carmin Muskrat, MD       Current Outpatient Prescriptions  Medication Sig Dispense Refill  . APPLE CIDER VINEGAR PO Take 200 mg by mouth 2 (two) times daily.    Marland Kitchen aspirin EC 81 MG tablet Take 81 mg by mouth daily.    . colchicine 0.6 MG tablet Take 0.6 mg by mouth 4 (four) times daily as needed (gout).     Marland Kitchen diltiazem (CARDIZEM CD) 120 MG 24 hr capsule Take 120 mg by mouth daily.    . enalapril (VASOTEC) 20 MG tablet Take 20 mg by mouth daily.     . ferrous sulfate 325 (65 FE) MG tablet Take 325 mg by mouth daily with breakfast.    . furosemide (LASIX) 40 MG tablet Take 80-120 mg by mouth 2 (two) times daily. 3 in the morning and 2 tablets at dinner    . gabapentin (NEURONTIN) 600 MG tablet Take 600 mg by mouth 2 (two) times daily.     . insulin glargine (LANTUS) 100 UNIT/ML injection Inject 60 Units into the skin at bedtime.    . insulin regular (NOVOLIN R,HUMULIN R) 100 units/mL injection Inject 30 Units into the skin 3 (three) times  daily before meals.    . meclizine (ANTIVERT) 25 MG tablet Take 25 mg by mouth 2 (two) times daily.     . metoCLOPramide (REGLAN) 5 MG tablet Take 5 mg by mouth 3 (three) times daily before meals.    . Multiple Vitamins-Minerals (MULTIVITAMIN WITH MINERALS) tablet Take 1 tablet by mouth daily.    . Omega-3 Fatty Acids (FISH OIL) 1200 MG CAPS Take 1 capsule by mouth 2 (two) times daily.    Marland Kitchen OVER THE COUNTER MEDICATION Take 2-3 tablets by mouth daily. Equate brand stool softener.  Take 2 tablets one day and 3 tablets the next day.    . Oxycodone HCl 10 MG TABS Take 10 mg by mouth daily as needed (pain).     . predniSONE (STERAPRED UNI-PAK) 10 MG tablet 4 tabs x 2 days, 3 tabs x 2 days, 2 tabs x 2 days, then 1 tab x 2 days    . rOPINIRole (REQUIP) 2 MG tablet Take 2 mg by mouth 2 (two) times daily.    .  simvastatin (ZOCOR) 10 MG tablet Take 10 mg by mouth at bedtime.    . sotalol (BETAPACE) 80 MG tablet Take 80 mg by mouth 2 (two) times daily.    . traMADol (ULTRAM) 50 MG tablet Take 2 tablets (100 mg total) by mouth every 6 (six) hours as needed for moderate pain or severe pain. 30 tablet 0  . warfarin (COUMADIN) 5 MG tablet Take 2.5-5 mg by mouth daily. 2.5 mg on T, Thu, and Sat and 5 mg on the other days    . Rivaroxaban (XARELTO) 15 MG TABS tablet Take 1 tablet (15 mg total) by mouth 2 (two) times daily with a meal. 39 tablet 0    Past Medical History  Diagnosis Date  . Hypertension   . Diabetes mellitus without complication   . CHF (congestive heart failure)   . Arthritis   . Dysrhythmia     a fib  . Left foot drop     wears brace  . Hyperlipidemia   . History of snoring     pt had sleep study but does not have sleep apnea  . Atrial fibrillation     Past Surgical History  Procedure Laterality Date  . Back surgery      x6  . Cardiac catheterization      [facility]  . Laminectomy with posterior lateral arthrodesis level 1 N/A 03/27/2013    Procedure: REEXPLORATION OF LUMBAR FUSION WITH REPLACEMENT OF SACRAL SCREWS AND ILIAC CREST BONE GRAFT REDO DECOMPRESSION LAMINECTOMY LUMBAR FIVE SACRAL ONE;  Surgeon: Elaina Hoops, MD;  Location: [place] [facility];  Service: Neurosurgery;  Laterality: N/A;  . Hardware removal N/A 10/16/2013    Procedure: Lumbar Hardware Removal;  Surgeon: Elaina Hoops, MD;  Location: [place] [facility]UR[facility];  Service: Neurosurgery;  Laterality: N/A;     Allergies: Allergies as of 08/20/2014 - Review Complete 09/15/2014  Allergen Reaction Noted  . Elavil [amitriptyline] Other (See Comments) 12/17/2012    History reviewed. No pertinent family history.  History   Social History  . Marital Status: Widowed    Spouse Name: N/A  . Number of Children: N/A  . Years of Education: N/A   Occupational History  . Not on file.   Social History Main Topics  . Smoking status:  Never Smoker   . Smokeless tobacco: Not on file  . Alcohol Use: No  . Drug Use: No  . Sexual Activity: Not on  file   Other Topics Concern  . Not on file   Social History Narrative     Review of Systems: All pertinent ROS as stated in HPI.   Physical Exam: Blood pressure 96/54, pulse 91, temperature 98.9 F (37.2 C), temperature source Oral, resp. rate 26, SpO2 95 %.   General: resting in bed, nasal cannula in place HEENT: PERRL, EOMI, no scleral icterus Cardiac: Irregularly irregular, no rubs, murmurs or gallops Pulm: Bibasilar rales and diffuse rhonchi, moving normal volumes of air Abd: soft, nontender, nondistended, BS present Ext: warm and well perfused, 2+ bilateral lower extremity edema Neuro: alert and oriented X3, cranial nerves II-XII grossly intact Skin: no rashes or lesions noted Psych: appropriate affect and cognition  Lab results: Basic Metabolic Panel:  Recent Labs  08/20/2014 1029  NA 137  K 3.7  CL 98  CO2 30  GLUCOSE 155*  BUN 18  CREATININE 0.90  CALCIUM 8.4  MG 2.2   Liver Function Tests:  Recent Labs  09/06/2014 1029  AST 42*  ALT 15  ALKPHOS 101  BILITOT 1.4*  PROT 6.4  ALBUMIN 2.7*   No results for input(s): LIPASE, AMYLASE in the last 72 hours. No results for input(s): AMMONIA in the last 72 hours. CBC:  Recent Labs  08/26/2014 1029  WBC 19.2*  HGB 12.7*  HCT 38.8*  MCV 95.1  PLT 206   Cardiac Enzymes:  Recent Labs  08/24/2014 1029  TROPONINI 0.03   BNP: No results for input(s): PROBNP in the last 72 hours. D-Dimer: No results for input(s): DDIMER in the last 72 hours. CBG:  Recent Labs  09/07/2014 0938 09/08/2014 1344 09/05/2014 1404  GLUCAP 162* 70 70   Hemoglobin A1C: No results for input(s): HGBA1C in the last 72 hours. Fasting Lipid Panel: No results for input(s): CHOL, HDL, LDLCALC, TRIG, CHOLHDL, LDLDIRECT in the last 72 hours. Thyroid Function Tests: No results for input(s): TSH, T4TOTAL, FREET4, T3FREE,  THYROIDAB in the last 72 hours. Anemia Panel: No results for input(s): VITAMINB12, FOLATE, FERRITIN, TIBC, IRON, RETICCTPCT in the last 72 hours. Coagulation:  Recent Labs  09/04/2014 1029  LABPROT 27.3*  INR 2.51*   Urine Drug Screen: Drugs of Abuse  No results found for: LABOPIA, COCAINSCRNUR, LABBENZ, AMPHETMU, THCU, LABBARB  Alcohol Level: No results for input(s): ETH in the last 72 hours. Urinalysis: No results for input(s): COLORURINE, LABSPEC, PHURINE, GLUCOSEU, HGBUR, BILIRUBINUR, KETONESUR, PROTEINUR, UROBILINOGEN, NITRITE, LEUKOCYTESUR in the last 72 hours.  Invalid input(s): APPERANCEUR Imaging results:  Dg Chest 2 View  08/19/2014   CLINICAL DATA:  Bilateral chest pain.  Shortness of breath.  EXAM: CHEST - 2 VIEW  COMPARISON:  Two-view chest x-ray 10/11/2013.  FINDINGS: The heart is enlarged. Lung volumes are low. Bilateral middle lobe and lingular airspace disease is present. No definite effusions are present. There is likely some disease in the left lower lobe as well.  IMPRESSION: 1. New bilateral airspace disease, predominately within the lingula and right middle lobe. This is most concerning for pneumonia. 2. Mild pulmonary vascular congestion is likely reactive. 3. Mild lower lobe airspace disease is present as well.   Electronically Signed   By: San Morelle M.D.   On: 08/29/2014 11:05   Patient is a 74 year old male with a history of type 2 diabetes, hypertension, congestive heart failure, hyperlipidemia, gout who presents with a productive cough consistent with community-acquired pneumonia.  Community Acquired Pneumonia: Patient with a two to three day history of productive cough  accompanied by pleuritic chest pain. Patient has a leukocytosis of 19 though patient has been afebrile. Patient has not had any recent contact with the medical system to confer higher risk for resistance. There is also some evidence of fluid overload as well as rales on exam with a  history of congestive heart failure that may be superimposed on the pneumonia. Chest x-ray showing bilateral airspace disease predominantly within the lingula and right middle lobe concerning for pneumonia. -ceftriaxone and azithromycin for 5 days (planned end date of 09/07/2014) -urinary strep and legionella antigens -Supplemental oxygen  -check BNP to evaluate for concurrent heart failure exacerbation -Will hold off on diuresis given the low blood pressures at this point -Trend troponins given chest pain -Continue tramadol chest and abdominal discomfort  Systolic congestive heart failure: Would question concurrent exacerbation leading to pulmonary edema and contribution of this to patient's respiratory status at this point. Echocardiogram from 02/21/2014 in care everywhere from [facility] indicating an EF of 40-45% with mild global hypokinesis more pronounced in the distal inferior wall and apical septum. Patient is currently on Lasix and is reportedly on Lasix 40 mg tablets 3 in the morning and 2 tablets at dinner totaling a 200 mg dosage daily. Patient is also on diltiazem 120 mg daily, enalapril 20 mg daily, sotalol 80 mg twice a day. -BNP -Hold Lasix at this point given low blood pressures.  Paroxysmal atrial fibrillation: Patient had also been anticoagulated for a DVT from November 2015. Patient previously switched from [place] because of issues with bleeding and affordability. Patient is on diltiazem 120 mg daily as well for rate control in addition to sotalol 80 mg BID. Patient is currently in atrial fibrillation. -Continue home Coumadin and diltiazem.  -We'll continue home aspirin while trending troponins. Will consider discontinuation given concurrent Coumadin therapy.  Hypertension: Patient's blood pressure have been low in the 90s/50s since initial evaluation. Patient is on diltiazem 120 mg daily, enalapril 20 mg daily, Lasix reportedly up to 200 mg daily, sotalol 80 mg twice a  day. -Hold home enalapril, Lasix, sotalol given low blood pressures upon initial evaluation. -Continue home diltiazem 120 mg daily.  Type two diabetes: Home regimen is lantus 60 QHS and novolog 30 unit TIDAC. Patient has been normoglycemic though on the lower limit of normal at 70 occasionally. Will titrate down patient's home dosing as an inpatient. -Continue with Lantus 45 units daily at bedtime -NovoLog 15 3 times a day before meals -Moderate sliding scale insulin with nighttime correction  Gout: Patient states that he's having a current acute gout flare in his left ankle. Patient currently on colchicine 0.6 mg 4 times a day. -Continue home colchicine at a lower dose of 0.6 mg twice a day.  Iron deficiency anemia: Patient is on 325 mg ferrous sulfate daily. -Continue home ferrous sulfate.  Neuropathic pain: Patient is on gabapentin 600 mg twice a day. -Continue home gabapentin  Diet: Carbohydrate modified Prophylaxis: Patient currently on Coumadin and therapeutic Code status: DNR/DNI, POA is son, Jalan Fariss cell [phone])  Dispo: Disposition is deferred at this time, awaiting improvement of current medical problems. Anticipated discharge in approximately 3 day(s).   The patient does have a current PCP Zorita Pang, MD) and does need an [facility] hospital follow-up appointment after discharge.  The patient does not have transportation limitations that hinder transportation to clinic appointments.  Signed: Luan Moore, M.D., Ph.D. Internal Medicine Teaching Service, PGY-1 08/22/2014, 2:15 PM

## 2014-09-03 NOTE — ED Notes (Signed)
MD at bedside. 

## 2014-09-03 NOTE — ED Notes (Signed)
CBG 94. Pt is alert and oriented. RN on 6N made aware.

## 2014-09-03 NOTE — Progress Notes (Signed)
ANTICOAGULATION CONSULT NOTE - Initial Consult  Pharmacy Consult for warfarin Indication: atrial fibrillation  Allergies  Allergen Reactions  . Elavil [Amitriptyline] Other (See Comments)    Passes out    Patient Measurements:    Vital Signs: Temp: 98 F (36.7 C) (03/16 1426) Temp Source: Oral (03/16 1426) BP: 97/45 mmHg (03/16 1426) Pulse Rate: 94 (03/16 1426)  Labs:  Recent Labs  09/02/2014 1029  HGB 12.7*  HCT 38.8*  PLT 206  LABPROT 27.3*  INR 2.51*  CREATININE 0.90  TROPONINI 0.03    CrCl cannot be calculated (Unknown ideal weight.).   Medical History: Past Medical History  Diagnosis Date  . Hypertension   . Diabetes mellitus without complication   . CHF (congestive heart failure)   . Arthritis   . Dysrhythmia     a fib  . Left foot drop     wears brace  . Hyperlipidemia   . History of snoring     pt had sleep study but does not have sleep apnea  . Atrial fibrillation     Medications:  Prescriptions prior to admission  Medication Sig Dispense Refill Last Dose  . APPLE CIDER VINEGAR PO Take 200 mg by mouth 2 (two) times daily.   09/05/2014 at Unknown time  . aspirin EC 81 MG tablet Take 81 mg by mouth daily.   09/09/2014 at Unknown time  . colchicine 0.6 MG tablet Take 0.6 mg by mouth 4 (four) times daily as needed (gout).    09/02/2014 at Unknown time  . diltiazem (CARDIZEM CD) 120 MG 24 hr capsule Take 120 mg by mouth daily.   08/25/2014 at Unknown time  . enalapril (VASOTEC) 20 MG tablet Take 20 mg by mouth daily.    09/15/2014 at Unknown time  . ferrous sulfate 325 (65 FE) MG tablet Take 325 mg by mouth daily with breakfast.   08/21/2014 at Unknown time  . furosemide (LASIX) 40 MG tablet Take 80-120 mg by mouth 2 (two) times daily. 3 in the morning and 2 tablets at dinner   09/02/2014 at Unknown time  . gabapentin (NEURONTIN) 600 MG tablet Take 600 mg by mouth 2 (two) times daily.    09/02/2014 at Unknown time  . insulin glargine (LANTUS) 100 UNIT/ML  injection Inject 60 Units into the skin at bedtime.   09/02/2014 at Unknown time  . insulin regular (NOVOLIN R,HUMULIN R) 100 units/mL injection Inject 30 Units into the skin 3 (three) times daily before meals.   08/26/2014 at Unknown time  . meclizine (ANTIVERT) 25 MG tablet Take 25 mg by mouth 2 (two) times daily.    09/02/2014 at Unknown time  . metoCLOPramide (REGLAN) 5 MG tablet Take 5 mg by mouth 3 (three) times daily before meals.   09/15/2014 at Unknown time  . Multiple Vitamins-Minerals (MULTIVITAMIN WITH MINERALS) tablet Take 1 tablet by mouth daily.   09/02/2014 at Unknown time  . Omega-3 Fatty Acids (FISH OIL) 1200 MG CAPS Take 1 capsule by mouth 2 (two) times daily.   08/30/2014 at Unknown time  . OVER THE COUNTER MEDICATION Take 2-3 tablets by mouth daily. Equate brand stool softener.  Take 2 tablets one day and 3 tablets the next day.   Past Week at Unknown time  . Oxycodone HCl 10 MG TABS Take 10 mg by mouth daily as needed (pain).    Past Month at Unknown time  . predniSONE (STERAPRED UNI-PAK) 10 MG tablet 4 tabs x 2 days, 3 tabs x  2 days, 2 tabs x 2 days, then 1 tab x 2 days   not yet started  . rOPINIRole (REQUIP) 2 MG tablet Take 2 mg by mouth 2 (two) times daily.   08/24/2014 at Unknown time  . simvastatin (ZOCOR) 10 MG tablet Take 10 mg by mouth at bedtime.   09/02/2014 at Unknown time  . sotalol (BETAPACE) 80 MG tablet Take 80 mg by mouth 2 (two) times daily.   09/18/2014 at 800  . traMADol (ULTRAM) 50 MG tablet Take 2 tablets (100 mg total) by mouth every 6 (six) hours as needed for moderate pain or severe pain. 30 tablet 0 09/02/2014 at Unknown time  . warfarin (COUMADIN) 5 MG tablet Take 2.5-5 mg by mouth daily. 2.5 mg on T, Thu, and Sat and 5 mg on the other days   08/31/2014  . Rivaroxaban (XARELTO) 15 MG TABS tablet Take 1 tablet (15 mg total) by mouth 2 (two) times daily with a meal. 39 tablet 0     Assessment: 55 yom presented to the ED with CP and SOB. He is chronically  anticoagulated for hx of afib. To continue coumadin while admitted. INR is therapeutic at 2.51. No bleeding noted. Also started on azithromycin which may increase the INR.   Goal of Therapy:  INR 2-3 Monitor platelets by anticoagulation protocol: Yes   Plan:  - Warfarin 5mg  PO x 1 tonight - Daily INR  Harlis Champoux, Rande Lawman 08/28/2014,3:07 PM

## 2014-09-03 NOTE — ED Provider Notes (Signed)
CSN: [id number]     Arrival date & time 09/08/2014  9629 History   First MD Initiated Contact with Patient 09/11/2014 [phone]     Chief Complaint  Patient presents with  . Chest Pain    HPI  Patient presents with concern of chest pain, abdominal pain.  Patient's symptoms began about 2 weeks ago.  Initially the patient had epigastric pain, by mouth intake associated nausea, worsening of pain, vomiting. Over the past days he has also developed left-sided chest pain.  This pain is worse with inspiration. No new fever, syncope, confusion, disorientation. No clear precipitant.   Past Medical History  Diagnosis Date  . Hypertension   . Diabetes mellitus without complication   . CHF (congestive heart failure)   . Arthritis   . Dysrhythmia     a fib  . Left foot drop     wears brace  . Hyperlipidemia   . History of snoring     pt had sleep study but does not have sleep apnea  . Atrial fibrillation    Past Surgical History  Procedure Laterality Date  . Back surgery      x6  . Cardiac catheterization      [facility]  . Laminectomy with posterior lateral arthrodesis level 1 N/A 03/27/2013    Procedure: REEXPLORATION OF LUMBAR FUSION WITH REPLACEMENT OF SACRAL SCREWS AND ILIAC CREST BONE GRAFT REDO DECOMPRESSION LAMINECTOMY LUMBAR FIVE SACRAL ONE;  Surgeon: Elaina Hoops, MD;  Location: [place];  Service: Neurosurgery;  Laterality: N/A;  . Hardware removal N/A 10/16/2013    Procedure: Lumbar Hardware Removal;  Surgeon: Elaina Hoops, MD;  Location: [place];  Service: Neurosurgery;  Laterality: N/A;   History reviewed. No pertinent family history. History  Substance Use Topics  . Smoking status: Never Smoker   . Smokeless tobacco: Not on file  . Alcohol Use: No    Review of Systems  Constitutional:       Per HPI, otherwise negative  HENT:       Per HPI, otherwise negative  Respiratory:       Per HPI, otherwise negative  Cardiovascular:       Per HPI, otherwise negative   Gastrointestinal: Positive for nausea and vomiting.  Endocrine:       Negative aside from HPI  Genitourinary:       Neg aside from HPI   Musculoskeletal:       Per HPI, otherwise negative  Skin: Negative.   Neurological: Negative for syncope.      Allergies  Elavil  Home Medications   Prior to Admission medications   Medication Sig Start Date End Date Taking? Authorizing Provider  APPLE CIDER VINEGAR PO Take 200 mg by mouth 2 (two) times daily.    Historical Provider, MD  aspirin EC 81 MG tablet Take 81 mg by mouth daily.    Historical Provider, MD  colchicine 0.6 MG tablet Take 0.6 mg by mouth 4 (four) times daily.    Historical Provider, MD  diltiazem (CARDIZEM CD) 120 MG 24 hr capsule Take 120 mg by mouth daily.    Historical Provider, MD  enalapril (VASOTEC) 20 MG tablet Take 20 mg by mouth daily.     Historical Provider, MD  ferrous sulfate 325 (65 FE) MG tablet Take 325 mg by mouth daily with breakfast.    Historical Provider, MD  furosemide (LASIX) 40 MG tablet Take 40 mg by mouth 2 (two) times daily. 3 in the morning  and 2 tablets at dinner    Historical Provider, MD  gabapentin (NEURONTIN) 600 MG tablet Take 600 mg by mouth 4 (four) times daily - after meals and at bedtime.     Historical Provider, MD  insulin glargine (LANTUS) 100 UNIT/ML injection Inject 60 Units into the skin at bedtime.    Historical Provider, MD  insulin regular (NOVOLIN R,HUMULIN R) 100 units/mL injection Inject 30 Units into the skin 3 (three) times daily before meals.    Historical Provider, MD  meclizine (ANTIVERT) 25 MG tablet Take 25 mg by mouth 4 (four) times daily.     Historical Provider, MD  metoCLOPramide (REGLAN) 5 MG tablet Take 5 mg by mouth 3 (three) times daily before meals.    Historical Provider, MD  Omega-3 Fatty Acids (FISH OIL) 1200 MG CAPS Take 1 capsule by mouth 2 (two) times daily.    Historical Provider, MD  OVER THE COUNTER MEDICATION Take 2-3 tablets by mouth daily. Equate  brand stool softener.  Take 2 tablets one day and 3 tablets the next day.    Historical Provider, MD  Rivaroxaban (XARELTO) 15 MG TABS tablet Take 1 tablet (15 mg total) by mouth 2 (two) times daily with a meal. 05/14/14 06/02/14  Rushil Sherrye Payor, MD  rivaroxaban (XARELTO) 20 MG TABS tablet Take 1 tablet (20 mg total) by mouth daily. 06/03/14   Rushil Sherrye Payor, MD  rivaroxaban Alveda Reasons) KIT Use as directed 05/14/14   Riccardo Dubin, MD  rOPINIRole (REQUIP) 2 MG tablet Take 2 mg by mouth 2 (two) times daily.    Historical Provider, MD  simvastatin (ZOCOR) 10 MG tablet Take 10 mg by mouth at bedtime.    Historical Provider, MD  sotalol (BETAPACE) 80 MG tablet Take 80 mg by mouth 2 (two) times daily.    Historical Provider, MD  traMADol (ULTRAM) 50 MG tablet Take 100 mg by mouth every 6 (six) hours as needed for pain.     Historical Provider, MD  traMADol (ULTRAM) 50 MG tablet Take 2 tablets (100 mg total) by mouth every 6 (six) hours as needed for moderate pain or severe pain. 05/14/14   Rushil Sherrye Payor, MD   BP 114/58 mmHg  Pulse 98  Temp(Src) 98.9 F (37.2 C) (Oral)  Resp 18  SpO2 92% Physical Exam  Constitutional: He is oriented to person, place, and time. He appears well-developed. No distress.  Uncomfortable appearing large male, awake and alert, interacting appropriately.  HENT:  Head: Normocephalic and atraumatic.  Eyes: Conjunctivae and EOM are normal.  Cardiovascular: Normal rate and regular rhythm.   Pulmonary/Chest: Effort normal. No stridor. No respiratory distress.  Decreased breath sounds bilaterally, tenderness to palpation about the left upper chest.  Abdominal: He exhibits no distension.  Musculoskeletal: He exhibits no edema.  Left distal lower extremity in a positional splint  Neurological: He is alert and oriented to person, place, and time.  Skin: Skin is warm and dry.  Psychiatric: He has a normal mood and affect.  Nursing note and vitals reviewed.   ED Course   Procedures (including critical care time) Labs Review Labs Reviewed  CBC - Abnormal; Notable for the following:    WBC 19.2 (*)    RBC 4.08 (*)    Hemoglobin 12.7 (*)    HCT 38.8 (*)    All other components within normal limits  COMPREHENSIVE METABOLIC PANEL - Abnormal; Notable for the following:    Glucose, Bld 155 (*)    Albumin  2.7 (*)    AST 42 (*)    Total Bilirubin 1.4 (*)    GFR calc non Af Amer 82 (*)    All other components within normal limits  PROTIME-INR - Abnormal; Notable for the following:    Prothrombin Time 27.3 (*)    INR 2.51 (*)    All other components within normal limits  CBG MONITORING, ED - Abnormal; Notable for the following:    Glucose-Capillary 162 (*)    All other components within normal limits  MAGNESIUM  TROPONIN I    Imaging Review Dg Chest 2 View  08/28/2014   CLINICAL DATA:  Bilateral chest pain.  Shortness of breath.  EXAM: CHEST - 2 VIEW  COMPARISON:  Two-view chest x-ray 10/11/2013.  FINDINGS: The heart is enlarged. Lung volumes are low. Bilateral middle lobe and lingular airspace disease is present. No definite effusions are present. There is likely some disease in the left lower lobe as well.  IMPRESSION: 1. New bilateral airspace disease, predominately within the lingula and right middle lobe. This is most concerning for pneumonia. 2. Mild pulmonary vascular congestion is likely reactive. 3. Mild lower lobe airspace disease is present as well.   Electronically Signed   By: San Morelle M.D.   On: 09/06/2014 11:05    EKG with atrial fibrillation, rate 94, T-wave changes, abnormal  Heart monitor 90-, atrial fibrillation, abnormal Pulse ox 92%, room air, abnormal  On repeat exam the patient is awake and alert, though he remains tachypneic, requiring oxygen. He and his family are aware of all results, need for admission. MDM   Final diagnoses:  CAP (community acquired pneumonia)   patient presents with ongoing chest pain,  dyspnea, is found to be hypoxic, tachypneic. Patient is anticoagulated for atrial fibrillation. Low suspicion for common current new pulmonary embolism given the patient's therapeutic INR level. Troponin is unremarkable, less suspicion for coronary ischemia. Patient has evidence for pneumonia, was admitted for further evaluation, management.  Carmin Muskrat, MD 09/08/2014 1154

## 2014-09-03 NOTE — ED Notes (Signed)
Pt placed on 2L [place] due to SpO2 levels being at 87% on RA. SpO2 improved to 97%

## 2014-09-03 NOTE — ED Notes (Signed)
Per pt sts bilateral chest pain mainly when he breathes in. Denies cough. sts some SOB

## 2014-09-03 NOTE — ED Notes (Signed)
CBG 70. Pt provided 4 oz cup of orange juice.

## 2014-09-03 NOTE — ED Notes (Signed)
CBG results given to Dr. Vanita Panda and Benjamine Mola RN

## 2014-09-03 NOTE — Progress Notes (Signed)
Date: 08/20/2014               Patient Name:  Phillip Meyer MRN: [id number]  DOB: 1941/04/30 Age / Sex: 74 y.o., male   PCP: Zorita Pang, MD              Medical Service: Internal Medicine Teaching Service              Attending Physician: Dr. Madilyn Fireman, MD                                   Chief Complaint: Chief Complaint  Patient presents with  . Chest Pain   Bilateral chest pain  on deep  Inspiration.  HPI: Phillip Meyer is a 74 y.o. male with past medical history of type 2 diabetes, hypertension, congestive heart failure, hyperlipidemia, gout who presented with chest and abdominal pain on deep inspiration and SOB.  Pt states that he haven't been feeling well for the past 2-3 weeks due to generalized malaise and weakness. Yesterday he had an episode of hypoglycemia of 93 when is sister checked it at home. The sister then tried to give him some oral glucose but he was too lethargic to take it. She called for the EMS service, EMS staff arrived and gave him  glucagon he recovered his alertness completely. EMS service staff offered to take him to the hospital but he refused to go and stayed home with his sister.   Pt reports productive cough of yellow sputum for 3 days although the phlegm is clearer today.  Today he decided to come [facility] because he have been having SOB and chest and abdominal pain on deep inspiration for the past 2 days. In the ED, pt desaturated in the 80's on room air, this was improved to 97% with oxygen 2l/ min  via nasal canula.  CXR was performed and showed Bilateral airspace disease predominantly in the Lingula and right middle lobe. WBC was elevated at 19.2. He was started on antibiotic for CAP with Rocephin and Zithromax.   Pt denies any sick contact, fever or chills.  Review of Systems:  Constitutional:  Reports lost of appetite. Denies fever, chills, diaphoresis.  HEENT: Endorses rhinorrhea and sneezing. Denies photophobia,  eye pain, redness, hearing loss, ear pain, congestion, sore throat, rhinorrhea, sneezing, mouth sores, trouble swallowing, neck pain, neck stiffness and tinnitus.  Cardiovascular: Leg swelling.Denies palpitations.  Genitourinary: Denies dysuria, urgency, frequency, hematuria, flank pain and difficulty urinating.  Musculoskeletal: Reports left ankle pain Denies myalgias, and gait problem.  Skin: Denies pallor, rash and wound.  Neurological: Denies dizziness, seizures, syncope, weakness, lightheadedness, numbness and headaches.  Hematological: Denies adenopathy. Easy bruising, personal or family bleeding history  Psychiatric/Behavioral: Denies suicidal ideation, mood changes, confusion, nervousness, sleep disturbance and agitation   Allergies: Elavil  Medications:  @HMEDSABBREVIATED @  Medical History: Past Medical History  Diagnosis Date  . Hypertension   . Diabetes mellitus without complication   . CHF (congestive heart failure)   . Arthritis   . Dysrhythmia     a fib  . Left foot drop     wears brace  . Hyperlipidemia   . History of snoring     pt had sleep study but does not have sleep apnea  . Atrial fibrillation     Surgical History: Past Surgical History  Procedure Laterality Date  . Back surgery  x6  . Cardiac catheterization      [facility]  . Laminectomy with posterior lateral arthrodesis level 1 N/A 03/27/2013    Procedure: REEXPLORATION OF LUMBAR FUSION WITH REPLACEMENT OF SACRAL SCREWS AND ILIAC CREST BONE GRAFT REDO DECOMPRESSION LAMINECTOMY LUMBAR FIVE SACRAL ONE;  Surgeon: Elaina Hoops, MD;  Location: [place] [facility];  Service: Neurosurgery;  Laterality: N/A;  . Hardware removal N/A 10/16/2013    Procedure: Lumbar Hardware Removal;  Surgeon: Elaina Hoops, MD;  Location: [place] [facility];  Service: Neurosurgery;  Laterality: N/A;    Social History: History   Social History  . Marital Status: Widowed    Spouse Name: N/A  . Number of Children: N/A  . Years of  Education: N/A   Occupational History  . Not on file.   Social History Main Topics  . Smoking status: Never Smoker   . Smokeless tobacco: Not on file  . Alcohol Use: No  . Drug Use: No  . Sexual Activity: Not on file   Other Topics Concern  . Not on file   Social History Narrative    Family History: History reviewed. No pertinent family history.     Labs/Studies: @VSSPO2RANGES @, No intake or output data in the 24 hours ending 08/29/2014 1333  Physical Exam: General: WDWN, healthy, cooperative, awake and alert,  No acute distress. Skin: Skin color, texture, turgor normal. No rashes or lesions. HEENT:  Head : Atraumatic, normocephalic. Eye:  PERRL, sclera anicteric, no conjunctival injection Nose: no nasal congestion.patent, no discharge Throat: No tonsillar erythema, exudates or enlargement. Mouth: Moist mucus, no lesions, dentition good. Neck: supple, no LAD Heart: Pulse regular rate and rhythm, normal S1S2, no murmurs appreciated.  Chest/ Lung: Normal depth and effort.  No tenderness to palpation Lungs sounds: Crackles bilaterally.  No rales, rhonchi, wheezing, or rubs. Abdomen: Soft, NTND, +BS, no hepatosplenomegaly noted.  Extremities:   LE edema + 3.  Warm and well perfused, no clubbing/cyanosis. Radial, pedal pulse +2.  Neuro: CN III-XII grossly intact, sensation intact, spontaneous movement of all limbs, no obvious deficits  Assessment/Plan:  Active Problems:   CAP (community acquired pneumonia)   Phillip Meyer is a 74 y.o. male with PMHx of Phillip Meyer is a 74 yo male with an history of type 2 diabetes, hypertension, congestive heart failure, hyperlipidemia, gout who presents with a productive cough and bilateral chest pain on inspiration.  # CAP: pt presented with SOB ,productive cough , chest pain on deep inspiration, low oxygen saturation on room air, coarse crackle on lung auscultation, has elevated WBC of 19.2, CXR showed  Bilateral airspace disease  predominantly in the Lingula and right middle lobe. WBC elevated at 19.2. Pt have not been in hospital or nursing facility recently. All this findings are consistent with CAP.  - Oxygen as needed -blood culture X2 -Rocephin IV -Zithromax IV -sputum Gram and culture.  # Congestive heart failure. Pt has documented heart failure and have been seen by a Cardiologist Dr. Florentina Addison . He had an Echocardiogram done  in September 2015 showing an EF of 40-45 % with mild global hypokinesis .   Pt exam is showing LE edema and lung crackles on auscultation. Pt reports that he takes lasix at home at the following regiment 120 mg in the morning and 80 mg in the evening. He also takes diltiazem 120 mg daily, Enalapril 20 mg daily, Sotalol 80 mg BID.  - test for BNP - hold lasix for now d/t low BP -continue  home diltiazem -hold home enalapril and Sotalol d/t low BP -strict I & O -Daily weight -fluid restriction.   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 He takes Gabapentin for his neuropathy and Reglan for gastroparesis.Pt an episode of hypoglycemia on 09/02/14, reportedly in the 20's. Also had a value of 70 for her CBG in the ED.   -Reduce home Lantus at 45 U at bedtime -Reduce home Novolog 15 U TID -Continue home gabapentin 600mg  QID - Continue home Reglan 5 mg before meals.   # Atrial fibrillation : pt has chronic A-fib and he takes Cardizem 120 mg daly and takes Coumadin for anticoagulation. INR is therapeutic today at 2.51. Pt is currently in A-fib rate control. HR 80's-90's.   -Monitor on telemetry -continue home Cardizem  -continue home coumadin  # Gout:  - continue home Colchicine.   # Hypertension: pt home regiment includes  diltiazem 120 mg daily, Enalapril 20 mg daily, Sotalol 80 mg BID and reportedly Lasix 200 mg daily . Pt BP is borderline low at this time. -Continue Home Diltiazem 120 mg QD -  Hold Enalapril, Sotalol, and Lasix  # Hyperlipidemia :  -Continue home Zocor 10 mg.   # FEN:  -Diet: Carb Modified  #DVT prophylaxis: heparin  #CODE STATUS: FULL CODE     This is a Careers information officer Note.  The care of the patient was discussed with Dr. Raelene Bott and the assessment and plan was formulated with their assistance.  Please see their note for official documentation of the patient encounter.

## 2014-09-03 NOTE — ED Notes (Signed)
Pt diaphoretic and drowsy, CBG 70.  Will give 4oz juice and sandwich and recheck in 15 mins prior to transport.

## 2014-09-03 NOTE — ED Notes (Addendum)
Spoke with admitting MD. Reports pt can go to med/surg bed on 6N. Made aware pt complains of CP and has AFIB. Admitting reports doesn't sound cardiac in nature as pt has pneumonia.

## 2014-09-04 ENCOUNTER — Encounter (HOSPITAL_COMMUNITY): Payer: Self-pay | Admitting: Cardiology

## 2014-09-04 DIAGNOSIS — I5042 Chronic combined systolic (congestive) and diastolic (congestive) heart failure: Secondary | ICD-10-CM

## 2014-09-04 DIAGNOSIS — I480 Paroxysmal atrial fibrillation: Secondary | ICD-10-CM

## 2014-09-04 DIAGNOSIS — J189 Pneumonia, unspecified organism: Secondary | ICD-10-CM

## 2014-09-04 DIAGNOSIS — I429 Cardiomyopathy, unspecified: Secondary | ICD-10-CM

## 2014-09-04 DIAGNOSIS — I4891 Unspecified atrial fibrillation: Secondary | ICD-10-CM | POA: Diagnosis present

## 2014-09-04 DIAGNOSIS — I5020 Unspecified systolic (congestive) heart failure: Secondary | ICD-10-CM

## 2014-09-04 DIAGNOSIS — I10 Essential (primary) hypertension: Secondary | ICD-10-CM

## 2014-09-04 DIAGNOSIS — E785 Hyperlipidemia, unspecified: Secondary | ICD-10-CM

## 2014-09-04 DIAGNOSIS — D509 Iron deficiency anemia, unspecified: Secondary | ICD-10-CM

## 2014-09-04 DIAGNOSIS — M109 Gout, unspecified: Secondary | ICD-10-CM

## 2014-09-04 LAB — LEGIONELLA ANTIGEN, URINE

## 2014-09-04 LAB — COMPREHENSIVE METABOLIC PANEL
ALK PHOS: 93 U/L (ref 39–117)
ALT: 13 U/L (ref 0–53)
AST: 30 U/L (ref 0–37)
Albumin: 2.3 g/dL — ABNORMAL LOW (ref 3.5–5.2)
Anion gap: 9 (ref 5–15)
BUN: 12 mg/dL (ref 6–23)
CALCIUM: 8.3 mg/dL — AB (ref 8.4–10.5)
CO2: 29 mmol/L (ref 19–32)
Chloride: 98 mmol/L (ref 96–112)
Creatinine, Ser: 0.73 mg/dL (ref 0.50–1.35)
GFR calc Af Amer: >90 mL/min (ref >90)
GFR calc non Af Amer: 89 mL/min — ABNORMAL LOW (ref >90)
Glucose, Bld: 186 mg/dL — ABNORMAL HIGH (ref 70–99)
Potassium: 3.9 mmol/L (ref 3.5–5.1)
Sodium: 136 mmol/L (ref 135–145)
Total Bilirubin: 1.2 mg/dL (ref 0.3–1.2)
Total Protein: 6.1 g/dL (ref 6.0–8.3)

## 2014-09-04 LAB — PROTIME-INR
INR: 2.11 — AB (ref 0.00–1.49)
PROTHROMBIN TIME: 23.9 s — AB (ref 11.6–15.2)

## 2014-09-04 LAB — CBC
HCT: 39 % (ref 39.0–52.0)
Hemoglobin: 12.5 g/dL — ABNORMAL LOW (ref 13.0–17.0)
MCH: 30.9 pg (ref 26.0–34.0)
MCHC: 32.1 g/dL (ref 30.0–36.0)
MCV: 96.3 fL (ref 78.0–100.0)
PLATELETS: 230 10*3/uL (ref 150–400)
RBC: 4.05 MIL/uL — AB (ref 4.22–5.81)
RDW: 14.1 % (ref 11.5–15.5)
WBC: 18.7 10*3/uL — ABNORMAL HIGH (ref 4.0–10.5)

## 2014-09-04 LAB — GLUCOSE, CAPILLARY
GLUCOSE-CAPILLARY: 173 mg/dL — AB (ref 70–99)
GLUCOSE-CAPILLARY: 164 mg/dL — AB (ref 70–99)
GLUCOSE-CAPILLARY: 167 mg/dL — AB (ref 70–99)
Glucose-Capillary: 219 mg/dL — ABNORMAL HIGH (ref 70–99)
Glucose-Capillary: 187 mg/dL — ABNORMAL HIGH (ref 70–99)
Glucose-Capillary: 198 mg/dL — ABNORMAL HIGH (ref 70–99)
Glucose-Capillary: 190 mg/dL — ABNORMAL HIGH (ref 70–99)

## 2014-09-04 LAB — HIV ANTIBODY (ROUTINE TESTING W REFLEX): HIV Screen 4th Generation wRfx: NONREACTIVE

## 2014-09-04 MED ORDER — DILTIAZEM HCL 30 MG PO TABS
30.0000 mg | ORAL_TABLET | Freq: Four times a day (QID) | ORAL | Status: DC
  Administered 2014-09-04 – 2014-09-05 (×5): 30 mg via ORAL
  Filled 2014-09-04 (×3): qty 1
  Filled 2014-09-04: qty 0.5
  Filled 2014-09-04 (×3): qty 1
  Filled 2014-09-04: qty 0.5

## 2014-09-04 MED ORDER — METOPROLOL TARTRATE 12.5 MG HALF TABLET
12.5000 mg | ORAL_TABLET | Freq: Two times a day (BID) | ORAL | Status: DC
  Administered 2014-09-04 – 2014-09-05 (×3): 12.5 mg via ORAL
  Filled 2014-09-04 (×3): qty 1

## 2014-09-04 MED ORDER — SOTALOL HCL 80 MG PO TABS
80.0000 mg | ORAL_TABLET | Freq: Two times a day (BID) | ORAL | Status: DC
  Administered 2014-09-04 – 2014-09-15 (×24): 80 mg via ORAL
  Filled 2014-09-04 (×29): qty 1

## 2014-09-04 MED ORDER — DM-GUAIFENESIN ER 30-600 MG PO TB12
1.0000 | ORAL_TABLET | Freq: Two times a day (BID) | ORAL | Status: DC
  Administered 2014-09-04 – 2014-09-15 (×24): 1 via ORAL
  Filled 2014-09-04 (×31): qty 1

## 2014-09-04 MED ORDER — WARFARIN SODIUM 5 MG PO TABS
5.0000 mg | ORAL_TABLET | Freq: Once | ORAL | Status: AC
  Administered 2014-09-04: 5 mg via ORAL
  Filled 2014-09-04: qty 1

## 2014-09-04 MED ORDER — TRAMADOL HCL 50 MG PO TABS
100.0000 mg | ORAL_TABLET | Freq: Four times a day (QID) | ORAL | Status: DC | PRN
  Administered 2014-09-04 – 2014-09-16 (×22): 100 mg via ORAL
  Filled 2014-09-04 (×23): qty 2

## 2014-09-04 MED ORDER — DILTIAZEM HCL 60 MG PO TABS: 30.0000 mg | ORAL_TABLET | Freq: Four times a day (QID) | ORAL | Status: DC

## 2014-09-04 MED ORDER — COLCHICINE 0.6 MG PO TABS
0.6000 mg | ORAL_TABLET | Freq: Every day | ORAL | Status: DC
  Administered 2014-09-04 – 2014-09-15 (×12): 0.6 mg via ORAL
  Filled 2014-09-04 (×15): qty 1

## 2014-09-04 NOTE — Progress Notes (Signed)
[place] for warfarin Indication: atrial fibrillation  Allergies  Allergen Reactions  . Elavil [Amitriptyline] Other (See Comments)    Passes out    Patient Measurements: Height: 5\' 11"  (180.3 cm) Weight: 292 lb 8.8 oz (132.7 kg) IBW/kg (Calculated) : 75.3  Vital Signs: Temp: 98.3 F (36.8 C) (03/17 0558) Temp Source: Oral (03/17 0558) BP: 107/49 mmHg (03/17 0957) Pulse Rate: 120 (03/17 0957)  Labs:  Recent Labs  09/05/2014 1029 09/09/2014 1846 08/29/2014 2131 09/04/14 0834  HGB 12.7*  --   --  12.5*  HCT 38.8*  --   --  39.0  PLT 206  --   --  230  LABPROT 27.3*  --   --  23.9*  INR 2.51*  --   --  2.11*  CREATININE 0.90  --   --  0.73  TROPONINI 0.03 <0.03 0.03  --     Estimated Creatinine Clearance: 112.6 mL/min (by C-G formula based on Cr of 0.73).    Assessment: 63 yom presented to the ED with CP and SOB. He is chronically anticoagulated for hx of afib and also had a DVT November 2015. To continue coumadin while admitted. INR remains therapeutic at 2.11 but pt refused dose last night. Last dose pta was on 3/13 per MD instructions at appt on Monday 3/14.   No bleeding noted. Also started on azithromycin which may increase the INR. Previous home dose was  2.5 mg TTSat and 5 mg all other days. I interviewed pt and son: his INR was 7.1 on Monday and he was told not to take his coumadin.  His son told him it was OK to take the coumadin last night 3/16, but per the RN, the pt refused it.  He understands he needs to take it today.    Goal of Therapy:  INR 2-3 Monitor platelets by anticoagulation protocol: Yes   Plan:  -coumadin 5 mg po x 1 dose today  - Daily INR  Eudelia Bunch, Pharm.D. [phone] 09/04/2014 2:11 PM

## 2014-09-04 NOTE — Evaluation (Signed)
Physical Therapy Evaluation Patient Details Name: Phillip Meyer MRN: [id number] DOB: 11/12/1940 Today's Date: 09/04/2014   History of Present Illness  Adm with pna PMHx- DM, multiple back surgeries with Lt foot drop, CHF, gout, RLE DVT  Clinical Impression  Pt admitted with above diagnosis. Pt desaturated with static standing (on 3L down to 87%, returned to 90% in 1 minute).  Pt currently with functional limitations due to the deficits listed below (see PT Problem List).  Pt will benefit from skilled PT to increase their independence and safety with mobility to allow discharge to the venue listed below.       Follow Up Recommendations Home health PT;Supervision/Assistance - 24 hour (pt may have 1-2 nights of 24/7 assist by sister--if needs more, he will need SNF. Pt agreeable if necessary, but hopeful to go home).    Equipment Recommendations  None recommended by PT    Recommendations for Other Services OT consult     Precautions / Restrictions Precautions Precautions: Fall;Other (comment) Precaution Comments: desaturates with activity Required Braces or Orthoses: Other Brace/Splint Other Brace/Splint: Lt AFO      Mobility  Bed Mobility                  Transfers Overall transfer level: Needs assistance Equipment used: Quad cane Transfers: Sit to/from Stand Sit to Stand: Min guard         General transfer comment: from recliner with use of armrest x 1; repeated x 2  Ambulation/Gait             General Gait Details: deferred due to decr SaO2 with static standing (on 3L)  Stairs            Wheelchair Mobility    Modified Rankin (Stroke Patients Only)       Balance Overall balance assessment: Needs assistance         Standing balance support: Single extremity supported Standing balance-Leahy Scale: Poor Standing balance comment: flexed trunk; quad cane too tall and lowered for better support/less fatigue to RUE                             Pertinent Vitals/Pain Pain Assessment: Faces Faces Pain Scale: Hurts a little bit Pain Location: Lt ankle Pain Intervention(s): Limited activity within patient's tolerance;Monitored during session    Home Living Family/patient expects to be discharged to:: Private residence Living Arrangements: Alone Available Help at Discharge: Available PRN/intermittently;Family (sister and neighbors (sister may stay 1-2 nights)) Type of Home: House Home Access: Ramped entrance     Home Layout: Two level;Able to live on main level with bedroom/bathroom (wood stove in basement; has other heat source as well) Home Equipment: Walker - 2 wheels;Cane - quad;Shower seat - built in;Grab bars - toilet;Grab bars - tub/shower;Walker - standard (Lt AFO)      Prior Function Level of Independence: Independent with assistive device(s)         Comments: RW at home and quad cane when he goes out     Hand Dominance   Dominant Hand: Right    Extremity/Trunk Assessment   Upper Extremity Assessment: Overall WFL for tasks assessed           Lower Extremity Assessment: Generalized weakness;LLE deficits/detail   LLE Deficits / Details: DF 0/5 (full PROM); knee extension 4/5, knee flexion 3/5  Cervical / Trunk Assessment: Kyphotic (flexed at hips in standing)  Communication   Communication: No difficulties  Cognition Arousal/Alertness: Awake/alert Behavior During Therapy: Flat affect Overall Cognitive Status: Within Functional Limits for tasks assessed                      General Comments General comments (skin integrity, edema, etc.): Son present (is an Therapist, sports; lives 3 hrs away)    Exercises General Exercises - Lower Extremity Ankle Circles/Pumps: AROM;Right;10 reps      Assessment/Plan    PT Assessment Patient needs continued PT services  PT Diagnosis Difficulty walking;Generalized weakness   PT Problem List Decreased strength;Decreased activity tolerance;Decreased  balance;Decreased mobility;Decreased knowledge of use of DME;Cardiopulmonary status limiting activity;Obesity;Pain  PT Treatment Interventions DME instruction;Gait training;Functional mobility training;Therapeutic activities;Therapeutic exercise;Balance training;Patient/family education   PT Goals (Current goals can be found in the Care Plan section) Acute Rehab PT Goals Patient Stated Goal: return home PT Goal Formulation: With patient Time For Goal Achievement: 09/11/14 Potential to Achieve Goals: Good    Frequency Min 3X/week   Barriers to discharge Decreased caregiver support      Co-evaluation               End of Session Equipment Utilized During Treatment: Oxygen Activity Tolerance: Treatment limited secondary to medical complications (Comment) (SaO2 decr with minimal activity) Patient left: in chair;with call bell/phone within reach;with family/visitor present           Time: 1257-1321 PT Time Calculation (min) (ACUTE ONLY): 24 min   Charges:   PT Evaluation $Initial PT Evaluation Tier I: 1 Procedure PT Treatments $Therapeutic Activity: 8-22 mins   PT G Codes:        Madora Barletta 02-Oct-2014, 1:33 PM Pager [phone]

## 2014-09-04 NOTE — Progress Notes (Signed)
LOS: 1 day   Subjective: Pt has no complaint. He said he is breathing better today.   Objective: BP 107/49 mmHg  Pulse 118  Temp(Src) 98.3 F (36.8 C) (Oral)  Resp 20  Ht 5\' 11"  (1.803 m)  Wt 132.7 kg (292 lb 8.8 oz)  BMI 40.82 kg/m2  SpO2 93%  Intake/Output Summary (Last 24 hours) at 09/04/14 1122 Last data filed at 09/04/14 1032  Gross per 24 hour  Intake    850 ml  Output   1100 ml  Net   -250 ml    Physical Exam: General: WDWN, healthy, cooperative, awake and alert, No acute distress. Sitting on his recliner.  Skin: Skin color, texture, turgor normal. No rashes or lesions. HEENT:  Head : Atraumatic, normocephalic. Eye: PERRL, sclera anicteric, no conjunctival injection Nose: no nasal congestion.patent, no discharge Throat: No tonsillar erythema, exudates or enlargement. Mouth: Moist mucus, no lesions, dentition good. Neck: supple, no LAD Heart: Pulse regular rate and rhythm, normal S1S2, no murmurs appreciated.  Chest/ Lung: Normal depth and effort.  No tenderness to palpation Lungs sounds:Diffuse crackles and rhonchi  bilaterally.  No rales, rhonchi, wheezing, or rubs. Abdomen: Soft, NTND, +BS, no hepatosplenomegaly noted.  Extremities: LE edema + 3. Warm and well perfused, no clubbing/cyanosis. Radial, pedal pulse +2.  Neuro: CN III-XII grossly intact, sensation intact, spontaneous movement of all limbs, no obvious deficits Labs/Studies: I have reviewed labs and studies from last 24hrs per EMR.  Medications: I have reviewed the patient's current medications.  Assessment/Plan: Active Problems:   IDDM (insulin dependent diabetes mellitus)   CAP (community acquired pneumonia)   HLD (hyperlipidemia)   BP (high blood pressure)   Gout of ankle   Atrial fibrillation   Phillip Meyer is a 74 y.o. male with PMHx of Phillip Meyer is a 74 yo male with an history of type 2 diabetes, hypertension, congestive heart failure, hyperlipidemia, gout who presents  with a productive cough and bilateral chest pain on inspiration.  # CAP: pt is breathing better and WBC count mildly reduced to 18.7 from 19.2 . Blood culture collected and still pending.  Strept pneumonia urine antigen negative, Legionella pending.  - Oxygen as needed -blood culture pending. -Rocephin IV -Zithromax IV -sputum Gram and culture.  # Congestive heart failure. Pt has BNP midly elevated at 270.5   - hold lasix  d/t low BP, consider restarting once BP improves. -continue home diltiazem -hold home enalapril d/t low BP -strict I & O -Daily weight -fluid restriction.   # Atrial fibrillation : pt has chronic A-fib and he takes Cardizem 120 mg daily, Sotalol 80 mg BID  and takes Coumadin for anticoagulation. INR is therapeutic today at 2.11. Pt is currently in A-fib heart rate is slightly increased today in the low 100's.  Concern for pt going to A-fib -RVR.  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 He takes Gabapentin for his neuropathy and Reglan for gastroparesis.  -continue reduced home Lantus at 45 U at bedtime -Continue reduced home Novolog 15 U TID -Continue home gabapentin 600mg  QID - Continue home Reglan 5 mg before meals.     # Gout:  - continue home Colchicine  # Hypertension: pt home regiment includes diltiazem 120 mg daily, Enalapril 20 mg daily, Sotalol 80 mg BID and  reportedly Lasix 200 mg daily . Pt BP is borderline low at this time. -Continue Home Diltiazem 120 mg QD - Hold Enalapril, and Lasix -restart home Sotalol  # Hyperlipidemia :  -Continue home Zocor 10 mg.   # FEN:  -Diet: Carb Modified  #DVT prophylaxis: heparin  CODE STATUS: FULL CODE   This is a Careers information officer Note. The care of the patient was discussed with Dr. Raelene Bott and the assessment and  plan was formulated with their assistance. Please see their note for official documentation of the patient encounter.

## 2014-09-04 NOTE — Progress Notes (Signed)
Meal tray just arrived.  Night RN made aware that insulin has not yet been given and to have NT recheck CBG due to late tray arrival. Glenna Fellows T

## 2014-09-04 NOTE — Progress Notes (Signed)
Subjective:  Patient reports that he is feeling better and breathing better this morning. Patient denies feeling any palpitations or significant dizziness. Patient otherwise not reporting any complaints this morning.  Objective: Vital signs in last 24 hours: Filed Vitals:   09/04/14 0500 09/04/14 0558 09/04/14 0957 09/04/14 1051  BP:  90/44 107/49   Pulse:  102 120 118  Temp:  98.3 F (36.8 C)    TempSrc:  Oral    Resp:  20    Height:      Weight: 292 lb 8.8 oz (132.7 kg)     SpO2:  93%     Weight change:   Intake/Output Summary (Last 24 hours) at 09/04/14 1058 Last data filed at 09/04/14 1022  Gross per 24 hour  Intake    850 ml  Output    400 ml  Net    450 ml    General: resting in bed, nasal cannula in place, currently on 5 L nasal cannula HEENT: PERRL, EOMI, no scleral icterus Cardiac: Irregularly irregular, tachycardic, no rubs, murmurs or gallops Pulm: Bibasilar rales and diffuse rhonchi, moving normal volumes of air Abd: soft, nontender, nondistended, BS present Ext: warm and well perfused, 2+ bilateral lower extremity edema Neuro: alert and oriented X3, cranial nerves II-XII grossly intact Skin: no rashes or lesions noted Psych: appropriate affect and cognition  Lab Results: Basic Metabolic Panel:  Recent Labs Lab 09/02/2014 1029 09/04/14 0834  NA 137 136  K 3.7 3.9  CL 98 98  CO2 30 29  GLUCOSE 155* 186*  BUN 18 12  CREATININE 0.90 0.73  CALCIUM 8.4 8.3*  MG 2.2  --    Liver Function Tests:  Recent Labs Lab 09/17/2014 1029 09/04/14 0834  AST 42* 30  ALT 15 13  ALKPHOS 101 93  BILITOT 1.4* 1.2  PROT 6.4 6.1  ALBUMIN 2.7* 2.3*   No results for input(s): LIPASE, AMYLASE in the last 168 hours. No results for input(s): AMMONIA in the last 168 hours. CBC:  Recent Labs Lab 08/24/2014 1029 09/04/14 0834  WBC 19.2* 18.7*  HGB 12.7* 12.5*  HCT 38.8* 39.0  MCV 95.1 96.3  PLT 206 230   Cardiac Enzymes:  Recent Labs Lab 08/26/2014 1029  08/28/2014 1846 08/31/2014 2131  TROPONINI 0.03 <0.03 0.03   BNP: No results for input(s): PROBNP in the last 168 hours. D-Dimer: No results for input(s): DDIMER in the last 168 hours. CBG:  Recent Labs Lab 09/15/2014 1431 09/13/2014 1710 09/15/2014 2032 08/29/2014 2150 09/04/14 0307 09/04/14 0755  GLUCAP 94 171* 227* 199* 219* 173*   Hemoglobin A1C: No results for input(s): HGBA1C in the last 168 hours. Fasting Lipid Panel: No results for input(s): CHOL, HDL, LDLCALC, TRIG, CHOLHDL, LDLDIRECT in the last 168 hours. Thyroid Function Tests: No results for input(s): TSH, T4TOTAL, FREET4, T3FREE, THYROIDAB in the last 168 hours. Coagulation:  Recent Labs Lab 09/10/2014 1029 09/04/14 0834  LABPROT 27.3* 23.9*  INR 2.51* 2.11*   Anemia Panel: No results for input(s): VITAMINB12, FOLATE, FERRITIN, TIBC, IRON, RETICCTPCT in the last 168 hours. Urine Drug Screen: Drugs of Abuse  No results found for: LABOPIA, COCAINSCRNUR, LABBENZ, AMPHETMU, THCU, LABBARB  Alcohol Level: No results for input(s): ETH in the last 168 hours. Urinalysis: No results for input(s): COLORURINE, LABSPEC, PHURINE, GLUCOSEU, HGBUR, BILIRUBINUR, KETONESUR, PROTEINUR, UROBILINOGEN, NITRITE, LEUKOCYTESUR in the last 168 hours.  Invalid input(s): APPERANCEUR  Micro Results: No results found for this or any previous visit (from the past 240 hour(s)).  Studies/Results: Dg Chest 2 View  08/31/2014   CLINICAL DATA:  Bilateral chest pain.  Shortness of breath.  EXAM: CHEST - 2 VIEW  COMPARISON:  Two-view chest x-ray 10/11/2013.  FINDINGS: The heart is enlarged. Lung volumes are low. Bilateral middle lobe and lingular airspace disease is present. No definite effusions are present. There is likely some disease in the left lower lobe as well.  IMPRESSION: 1. New bilateral airspace disease, predominately within the lingula and right middle lobe. This is most concerning for pneumonia. 2. Mild pulmonary vascular congestion is  likely reactive. 3. Mild lower lobe airspace disease is present as well.   Electronically Signed   By: San Morelle M.D.   On: 08/26/2014 11:05   Medications: I have reviewed the patient's current medications. Scheduled Meds: . azithromycin  500 mg Oral Q24H  . cefTRIAXone (ROCEPHIN)  IV  1 g Intravenous Q24H  . colchicine  0.6 mg Oral Daily  . diltiazem  30 mg Oral 4 times per day  . docusate sodium  200 mg Oral Daily  . ferrous sulfate  325 mg Oral Q breakfast  . gabapentin  600 mg Oral BID  . insulin aspart  0-15 Units Subcutaneous TID WC  . insulin aspart  0-5 Units Subcutaneous QHS  . insulin aspart  15 Units Subcutaneous TID WC  . insulin glargine  45 Units Subcutaneous QHS  . metoCLOPramide  5 mg Oral TID AC  . metoprolol tartrate  12.5 mg Oral BID  . multivitamin with minerals  1 tablet Oral Daily  . rOPINIRole  2 mg Oral BID  . simvastatin  10 mg Oral QHS  . sodium chloride  3 mL Intravenous Q12H  . sotalol  80 mg Oral BID  . warfarin  5 mg Oral ONCE-1800  . Warfarin - Pharmacist Dosing Inpatient   Does not apply q1800   Continuous Infusions:  PRN Meds:.morphine injection Assessment/Plan: Active Problems:   IDDM (insulin dependent diabetes mellitus)   CAP (community acquired pneumonia)   HLD (hyperlipidemia)   BP (high blood pressure)   Gout of ankle   Atrial fibrillation   Patient is a 74 year old male with a history of type 2 diabetes, hypertension, congestive heart failure, hyperlipidemia, gout who presents with a productive cough consistent with community-acquired pneumonia.   Community Acquired Pneumonia: Patient is noting some improvement in his breathing status. Physical exam is stable. Patient still having a supplemental oxygen requirement. Leukocytosis is stable at 18.7. Patient is still exhibiting some concurrent signs of mild acute on chronic congestive heart failure which may also be compromising his respiratory status. Urinary strep pneumonia  negative. -Continue day 2 of ceftriaxone and azithromycin (planned end date of 09/07/2014) -Urinary strep Legionella pending. -Continue home tramadol for chest and abdominal discomfort.  Paroxysmal atrial fibrillation: Patient persistently in atrial fibrillation during this hospitalization. Patient noted to have been in sinus rhythm as an outpatient from [facility] in January 2016. Patient's chads vasc score is 3. Patient is on diltiazem 120 mg daily, sotalol 80 mg twice a day. Patient is currently on anticoagulation with Coumadin. -Appreciate cardiology recommendations -Diltiazem 30 mg every 6 hours. -Metoprolol 12.5 mg twice a day -Continue home sotalol to help maintain sinus rhythm once is reestablished. -Continue with Coumadin anticoagulation. -Discontinue home aspirin.  Systolic congestive heart failure: Patient exhibiting persistent signs of mild fluid overload on lung exam with edema in extremities as well. Patient's low blood pressures make it difficult to initiate diuresis at this point.  BNP elevated at 270. Echocardiogram from 02/21/2014 in care everywhere from [facility] indicating an EF of 40-45% with mild global hypokinesis more pronounced in the distal inferior wall and apical septum. Patient is currently on Lasix and is reportedly on Lasix 40 mg tablets 3 in the morning and 2 tablets at dinner totaling a 200 mg dosage daily. At home, patient is also on diltiazem 120 mg daily, enalapril 20 mg daily, sotalol 80 mg twice a day. -Hold home Lasix at this point given low blood pressures.   Hypertension: Patient's blood pressures have remained low overnight in the 03J over 00X systolic. At home, patient is on diltiazem 120 mg daily, enalapril 20 mg daily, Lasix reportedly up to 200 mg daily, sotalol 80 mg twice a day.  -Hold home enalapril, Lasix given low blood pressures upon initial evaluation.  -Blood pressure management as above with paroxysmal atrial  fibrillation.  Type two diabetes: Patient's blood glucose levels have remained elevated in the high 100s to low 200s overnight. Home regimen is lantus 60 QHS and novolog 30 unit TIDAC. Patient has been normoglycemic though on the lower limit of normal at 70 occasionally. -Consider resuming home Lantus of 60 units daily at bedtime. -Continue NovoLog 15 3 times a day before meals  -Moderate sliding scale insulin with nighttime correction   Gout: Patient states that he's having a current acute gout flare in his left ankle. Patient currently on colchicine 0.6 mg 4 times a day. It is noted that there is a interaction with diltiazem. -Continue home colchicine at a lower dose of 0.6 mg daily.  Iron deficiency anemia: Patient is on 325 mg ferrous sulfate daily.  -Continue home ferrous sulfate.   Neuropathic pain: Patient is on gabapentin 600 mg twice a day.  -Continue home gabapentin   Diet: Carbohydrate modified  Prophylaxis: Patient currently on Coumadin and therapeutic  Code status: DNR/DNI, POA is son, Lonnel Gjerde cell [phone])  Dispo: Disposition is deferred at this time, awaiting improvement of current medical problems. Anticipated discharge in approximately 2 day(s).   The patient does have a current PCP Zorita Pang, MD) and does need an [facility] hospital follow-up appointment after discharge.  The patient does not have transportation limitations that hinder transportation to clinic appointments.    LOS: 1 day   Services Needed at time of discharge: Y = Yes, Blank = No PT:   OT:   RN:   Equipment:   Other:    Luan Moore, MD 09/04/2014, 10:58 AM

## 2014-09-04 NOTE — Consult Note (Signed)
Primary cardiologist: [facility]  HPI: 74 year old male with past medical history of paroxysmal atrial fibrillation, nonischemic cardiomyopathy, congestive heart failure, diabetes mellitus, hypertension admitted with pneumonia for evaluation of atrial fibrillation. Patient apparently had a cardiac catheterization in 1998 that showed no obstructive coronary disease. His last echocardiogram at [facility] in September 2015 showed an ejection fraction of 40-45%, moderate left atrial enlargement and mild mitral regurgitation. Patient did develop a DVT in November 2015 while off of Coumadin prior to the procedure. Patient presented with complaints of pleuritic chest pain for 3 days, increasing dyspnea, productive cough, and weakness. He denies fevers or chills. He was diagnosed with pneumonia and has been placed on antibiotics. He has noted to be in atrial fibrillation with a rapid ventricular response. Cardiology asked to evaluate. Patient denies palpitations but notes increased pedal edema over the past 6 months.  Medications Prior to Admission  Medication Sig Dispense Refill  . APPLE CIDER VINEGAR PO Take 200 mg by mouth 2 (two) times daily.    Marland Kitchen aspirin EC 81 MG tablet Take 81 mg by mouth daily.    . colchicine 0.6 MG tablet Take 0.6 mg by mouth 4 (four) times daily as needed (gout).     Marland Kitchen diltiazem (CARDIZEM CD) 120 MG 24 hr capsule Take 120 mg by mouth daily.    . enalapril (VASOTEC) 20 MG tablet Take 20 mg by mouth daily.     . ferrous sulfate 325 (65 FE) MG tablet Take 325 mg by mouth daily with breakfast.    . furosemide (LASIX) 40 MG tablet Take 80-120 mg by mouth 2 (two) times daily. 3 in the morning and 2 tablets at dinner    . gabapentin (NEURONTIN) 600 MG tablet Take 600 mg by mouth 2 (two) times daily.     . insulin glargine (LANTUS) 100 UNIT/ML injection Inject 60 Units into the skin at bedtime.    . insulin regular (NOVOLIN R,HUMULIN R) 100 units/mL injection Inject 30  Units into the skin 3 (three) times daily before meals.    . meclizine (ANTIVERT) 25 MG tablet Take 25 mg by mouth 2 (two) times daily.     . metoCLOPramide (REGLAN) 5 MG tablet Take 5 mg by mouth 3 (three) times daily before meals.    . Multiple Vitamins-Minerals (MULTIVITAMIN WITH MINERALS) tablet Take 1 tablet by mouth daily.    . Omega-3 Fatty Acids (FISH OIL) 1200 MG CAPS Take 1 capsule by mouth 2 (two) times daily.    Marland Kitchen OVER THE COUNTER MEDICATION Take 2-3 tablets by mouth daily. Equate brand stool softener.  Take 2 tablets one day and 3 tablets the next day.    . Oxycodone HCl 10 MG TABS Take 10 mg by mouth daily as needed (pain).     . predniSONE (STERAPRED UNI-PAK) 10 MG tablet 4 tabs x 2 days, 3 tabs x 2 days, 2 tabs x 2 days, then 1 tab x 2 days    . rOPINIRole (REQUIP) 2 MG tablet Take 2 mg by mouth 2 (two) times daily.    . simvastatin (ZOCOR) 10 MG tablet Take 10 mg by mouth at bedtime.    . sotalol (BETAPACE) 80 MG tablet Take 80 mg by mouth 2 (two) times daily.    . traMADol (ULTRAM) 50 MG tablet Take 2 tablets (100 mg total) by mouth every 6 (six) hours as needed for moderate pain or severe pain. 30 tablet 0  . warfarin (COUMADIN) 5 MG tablet  Take 2.5-5 mg by mouth daily. 2.5 mg on T, Thu, and Sat and 5 mg on the other days    . Rivaroxaban (XARELTO) 15 MG TABS tablet Take 1 tablet (15 mg total) by mouth 2 (two) times daily with a meal. 39 tablet 0    Allergies  Allergen Reactions  . Elavil [Amitriptyline] Other (See Comments)    Passes out    Past Medical History  Diagnosis Date  . Hypertension   . CHF (congestive heart failure)   . Arthritis   . Dysrhythmia     a fib  . Left foot drop     wears brace  . Hyperlipidemia   . History of snoring     pt had sleep study but does not have sleep apnea  . Atrial fibrillation   . Pneumonia 08/27/2014  . Shortness of breath dyspnea   . 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 Back surgery      x6  . Cardiac catheterization      [facility]  . Laminectomy with posterior lateral arthrodesis level 1 N/A 03/27/2013    Procedure: REEXPLORATION OF LUMBAR FUSION WITH REPLACEMENT OF SACRAL SCREWS AND ILIAC CREST BONE GRAFT REDO DECOMPRESSION LAMINECTOMY LUMBAR FIVE SACRAL ONE;  Surgeon: Elaina Hoops, MD;  Location: [place] [facility]UR[facility] ORS;  Service: Neurosurgery;  Laterality: N/A;  . Hardware removal N/A 10/16/2013    Procedure: Lumbar Hardware Removal;  Surgeon: Elaina Hoops, MD;  Location: [place] NEURO ORS;  Service: Neurosurgery;  Laterality: N/A;    History   Social History  . Marital Status: Widowed    Spouse Name: N/A  . Number of Children: N/A  . Years of Education: N/A   Occupational History  . Not on file.   Social History Main Topics  . Smoking status: Never Smoker   . Smokeless tobacco: Never Used  . Alcohol Use: No  . Drug Use: No  . Sexual Activity: Not on file   Other Topics Concern  . Not on file   Social History Narrative    History reviewed. No pertinent family history.  ROS:  productive cough and weakness but no fevers or chills, hemoptysis, dysphasia, odynophagia, melena, hematochezia, dysuria, hematuria, rash, seizure activity, orthopnea, PND, claudication. Remaining systems are negative.  Physical Exam:   Blood pressure 107/49, pulse 120, temperature 98.3 F (36.8 C), temperature source Oral, resp. rate 20, height _0  (1.803 m), weight 292 lb 8.8 oz (132.7 kg), SpO2 93 %.  General:  Well developed/obese in NAD Skin warm/dry Patient not depressed No peripheral clubbing Back-normal HEENT-normal/normal eyelids Neck supple/normal carotid upstroke bilaterally; no bruits; no thyromegaly chest - diminished breath sounds at the bases with crackles right lower lobe. CV - irregular and tachycardic/normal S1 and S2; no murmurs, rubs or gallops;  PMI nondisplaced Abdomen -NT/ND, no HSM, no mass, + bowel sounds, no bruit 2+  femoral pulses, no bruits Ext-2+ edema, no chords Neuro-grossly nonfocal  ECG 09/15/2014 atrial fibrillation, cannot rule out prior anterior infarct.  Results for orders placed or performed during the hospital encounter of 08/28/2014 (from the past 48 hour(s))  CBG monitoring, ED     Status: Abnormal   Collection Time: 08/23/2014  9:38 AM  Result Value Ref Range   Glucose-Capillary 162 (H) 70 - 99 mg/dL   Comment 1 Notify RN    Comment 2 Documented in Char   CBC  Status: Abnormal   Collection Time: 08/19/2014 10:29 AM  Result Value Ref Range   WBC 19.2 (H) 4.0 - 10.5 K/uL   RBC 4.08 (L) 4.22 - 5.81 MIL/uL   Hemoglobin 12.7 (L) 13.0 - 17.0 g/dL   HCT 38.8 (L) 39.0 - 52.0 %   MCV 95.1 78.0 - 100.0 fL   MCH 31.1 26.0 - 34.0 pg   MCHC 32.7 30.0 - 36.0 g/dL   RDW 13.9 11.5 - 15.5 %   Platelets 206 150 - 400 K/uL  Comprehensive metabolic panel     Status: Abnormal   Collection Time: 08/28/2014 10:29 AM  Result Value Ref Range   Sodium 137 135 - 145 mmol/L   Potassium 3.7 3.5 - 5.1 mmol/L   Chloride 98 96 - 112 mmol/L   CO2 30 19 - 32 mmol/L   Glucose, Bld 155 (H) 70 - 99 mg/dL   BUN 18 6 - 23 mg/dL   Creatinine, Ser 0.90 0.50 - 1.35 mg/dL   Calcium 8.4 8.4 - 10.5 mg/dL   Total Protein 6.4 6.0 - 8.3 g/dL   Albumin 2.7 (L) 3.5 - 5.2 g/dL   AST 42 (H) 0 - 37 U/L   ALT 15 0 - 53 U/L   Alkaline Phosphatase 101 39 - 117 U/L   Total Bilirubin 1.4 (H) 0.3 - 1.2 mg/dL   GFR calc non Af Amer 82 (L) >90 mL/min   GFR calc Af Amer >90 >90 mL/min    Comment: (NOTE) The eGFR has been calculated using the CKD EPI equation. This calculation has not been validated in all clinical situations. eGFR's persistently <90 mL/min signify possible Chronic Kidney Disease.    Anion gap 9 5 - 15  Magnesium     Status: None   Collection Time: 09/18/2014 10:29 AM  Result Value Ref Range   Magnesium 2.2 1.5 - 2.5 mg/dL  Protime-INR     Status: Abnormal   Collection Time: 09/11/2014 10:29 AM  Result Value  Ref Range   Prothrombin Time 27.3 (H) 11.6 - 15.2 seconds   INR 2.51 (H) 0.00 - 1.49  Troponin I (order at [facility])     Status: None   Collection Time: 08/22/2014 10:29 AM  Result Value Ref Range   Troponin I 0.03 <0.031 ng/mL    Comment:        NO INDICATION OF MYOCARDIAL INJURY.   CBG monitoring, ED     Status: None   Collection Time: 08/20/2014  1:44 PM  Result Value Ref Range   Glucose-Capillary 70 70 - 99 mg/dL  CBG monitoring, ED     Status: None   Collection Time: 08/21/2014  2:04 PM  Result Value Ref Range   Glucose-Capillary 70 70 - 99 mg/dL  CBG monitoring, ED     Status: None   Collection Time: 08/24/2014  2:31 PM  Result Value Ref Range   Glucose-Capillary 94 70 - 99 mg/dL   Comment 1 Notify RN    Comment 2 Documented in Char   Glucose, capillary     Status: Abnormal   Collection Time: 08/31/2014  5:10 PM  Result Value Ref Range   Glucose-Capillary 171 (H) 70 - 99 mg/dL  Troponin I     Status: None   Collection Time: 09/12/2014  6:46 PM  Result Value Ref Range   Troponin I <0.03 <0.031 ng/mL    Comment:        NO INDICATION OF MYOCARDIAL INJURY.   Brain natriuretic peptide  Status: Abnormal   Collection Time: 09/13/2014  6:46 PM  Result Value Ref Range   B Natriuretic Peptide 270.5 (H) 0.0 - 100.0 pg/mL  Strep pneumoniae urinary antigen     Status: None   Collection Time: 08/31/2014  7:45 PM  Result Value Ref Range   Strep Pneumo Urinary Antigen NEGATIVE NEGATIVE    Comment:        Infection due to S. pneumoniae cannot be absolutely ruled out since the antigen present may be below the detection limit of the test.   Glucose, capillary     Status: Abnormal   Collection Time: 08/25/2014  8:32 PM  Result Value Ref Range   Glucose-Capillary 227 (H) 70 - 99 mg/dL  Troponin I     Status: None   Collection Time: 09/01/2014  9:31 PM  Result Value Ref Range   Troponin I 0.03 <0.031 ng/mL    Comment:        NO INDICATION OF MYOCARDIAL INJURY.   Glucose, capillary      Status: Abnormal   Collection Time: 09/06/2014  9:50 PM  Result Value Ref Range   Glucose-Capillary 199 (H) 70 - 99 mg/dL   Comment 1 Notify RN   Glucose, capillary     Status: Abnormal   Collection Time: 09/04/14  3:07 AM  Result Value Ref Range   Glucose-Capillary 219 (H) 70 - 99 mg/dL   Comment 1 Notify RN   Glucose, capillary     Status: Abnormal   Collection Time: 09/04/14  7:55 AM  Result Value Ref Range   Glucose-Capillary 173 (H) 70 - 99 mg/dL   Comment 1 Notify RN   Comprehensive metabolic panel     Status: Abnormal   Collection Time: 09/04/14  8:34 AM  Result Value Ref Range   Sodium 136 135 - 145 mmol/L   Potassium 3.9 3.5 - 5.1 mmol/L   Chloride 98 96 - 112 mmol/L   CO2 29 19 - 32 mmol/L   Glucose, Bld 186 (H) 70 - 99 mg/dL   BUN 12 6 - 23 mg/dL   Creatinine, Ser 0.73 0.50 - 1.35 mg/dL   Calcium 8.3 (L) 8.4 - 10.5 mg/dL   Total Protein 6.1 6.0 - 8.3 g/dL   Albumin 2.3 (L) 3.5 - 5.2 g/dL   AST 30 0 - 37 U/L   ALT 13 0 - 53 U/L   Alkaline Phosphatase 93 39 - 117 U/L   Total Bilirubin 1.2 0.3 - 1.2 mg/dL   GFR calc non Af Amer 89 (L) >90 mL/min   GFR calc Af Amer >90 >90 mL/min    Comment: (NOTE) The eGFR has been calculated using the CKD EPI equation. This calculation has not been validated in all clinical situations. eGFR's persistently <90 mL/min signify possible Chronic Kidney Disease.    Anion gap 9 5 - 15  CBC     Status: Abnormal   Collection Time: 09/04/14  8:34 AM  Result Value Ref Range   WBC 18.7 (H) 4.0 - 10.5 K/uL   RBC 4.05 (L) 4.22 - 5.81 MIL/uL   Hemoglobin 12.5 (L) 13.0 - 17.0 g/dL   HCT 39.0 39.0 - 52.0 %   MCV 96.3 78.0 - 100.0 fL   MCH 30.9 26.0 - 34.0 pg   MCHC 32.1 30.0 - 36.0 g/dL   RDW 14.1 11.5 - 15.5 %   Platelets 230 150 - 400 K/uL  Protime-INR     Status: Abnormal   Collection Time: 09/04/14  8:34 AM  Result Value Ref Range   Prothrombin Time 23.9 (H) 11.6 - 15.2 seconds   INR 2.11 (H) 0.00 - 1.49    Dg Chest 2  View  09/08/2014   CLINICAL DATA:  Bilateral chest pain.  Shortness of breath.  EXAM: CHEST - 2 VIEW  COMPARISON:  Two-view chest x-ray 10/11/2013.  FINDINGS: The heart is enlarged. Lung volumes are low. Bilateral middle lobe and lingular airspace disease is present. No definite effusions are present. There is likely some disease in the left lower lobe as well.  IMPRESSION: 1. New bilateral airspace disease, predominately within the lingula and right middle lobe. This is most concerning for pneumonia. 2. Mild pulmonary vascular congestion is likely reactive. 3. Mild lower lobe airspace disease is present as well.   Electronically Signed   By: San Morelle M.D.   On: 09/04/2014 11:05    Assessment/Plan 1 paroxysmal atrial fibrillation-the patient has recurrent atrial fibrillation. In reviewing his notes from [facility] he was seen in clinic in January 2016 and appeared to have been in sinus at that time. Will continue anticoagulation as his CHADS vasc is 3. Note he also had a recent DVT. His rate is elevated which is most likely being driven by his pneumonia. I will change Cardizem to 30 mg by mouth every 6 hours. Add metoprolol 12.5 mg twice a day. Given his nonischemic cardiomyopathy I would like to transition from a calcium blocker to a beta blocker. Digoxin could be added if blood pressure becomes an issue. I would not pursue cardioversion at this point until his pneumonia improves (could be done as outpt at [facility] once he recovers fully from pneumonia. I would continue sotalol for now to help maintain sinus rhythm in the future once it is reestablished. He will need close follow-up at [facility] following discharge. 2 nonischemic cardiomyopathy-patient was on an ACE inhibitor at the time of admission. This is being held as his blood pressure is borderline. Would resume at lower dose as blood pressure improves. As outlined above I would like to transition from his Cardizem to a beta  blocker for his atrial fibrillation. 3 recent DVT-continue anticoagulation. DC asa. 4 pneumonia-antibiotics per primary care. 5 chronic combined systolic/diastolic congestive heart failure-patient appears to be volume overloaded. His BNP was not dramatically elevated at time of admission. As his blood pressure improves would resume preadmission dose of Lasix (most likely in AM).  Kirk Ruths MD 09/04/2014, 10:24 AM

## 2014-09-05 DIAGNOSIS — I480 Paroxysmal atrial fibrillation: Secondary | ICD-10-CM

## 2014-09-05 DIAGNOSIS — I5042 Chronic combined systolic (congestive) and diastolic (congestive) heart failure: Secondary | ICD-10-CM

## 2014-09-05 DIAGNOSIS — I429 Cardiomyopathy, unspecified: Secondary | ICD-10-CM

## 2014-09-05 LAB — BASIC METABOLIC PANEL
Anion gap: 7 (ref 5–15)
BUN: 12 mg/dL (ref 6–23)
CALCIUM: 8.2 mg/dL — AB (ref 8.4–10.5)
CHLORIDE: 95 mmol/L — AB (ref 96–112)
CO2: 33 mmol/L — AB (ref 19–32)
Creatinine, Ser: 0.63 mg/dL (ref 0.50–1.35)
GFR calc Af Amer: >90 mL/min (ref >90)
GFR calc non Af Amer: >90 mL/min (ref >90)
Glucose, Bld: 160 mg/dL — ABNORMAL HIGH (ref 70–99)
Potassium: 3.9 mmol/L (ref 3.5–5.1)
Sodium: 135 mmol/L (ref 135–145)

## 2014-09-05 LAB — GLUCOSE, CAPILLARY
GLUCOSE-CAPILLARY: 147 mg/dL — AB (ref 70–99)
Glucose-Capillary: 201 mg/dL — ABNORMAL HIGH (ref 70–99)
Glucose-Capillary: 168 mg/dL — ABNORMAL HIGH (ref 70–99)
Glucose-Capillary: 119 mg/dL — ABNORMAL HIGH (ref 70–99)

## 2014-09-05 LAB — CBC
HCT: 36.9 % — ABNORMAL LOW (ref 39.0–52.0)
Hemoglobin: 11.6 g/dL — ABNORMAL LOW (ref 13.0–17.0)
MCH: 30.3 pg (ref 26.0–34.0)
MCHC: 31.4 g/dL (ref 30.0–36.0)
MCV: 96.3 fL (ref 78.0–100.0)
Platelets: 257 10*3/uL (ref 150–400)
RBC: 3.83 MIL/uL — AB (ref 4.22–5.81)
RDW: 14 % (ref 11.5–15.5)
WBC: 16.3 10*3/uL — AB (ref 4.0–10.5)

## 2014-09-05 LAB — PROTIME-INR
INR: 2.33 — AB (ref 0.00–1.49)
Prothrombin Time: 25.7 seconds — ABNORMAL HIGH (ref 11.6–15.2)

## 2014-09-05 MED ORDER — IPRATROPIUM-ALBUTEROL 0.5-2.5 (3) MG/3ML IN SOLN
3.0000 mL | RESPIRATORY_TRACT | Status: DC | PRN
  Administered 2014-09-16: 3 mL via RESPIRATORY_TRACT
  Filled 2014-09-05: qty 3

## 2014-09-05 MED ORDER — WARFARIN SODIUM 5 MG PO TABS
2.5000 mg | ORAL_TABLET | ORAL | Status: DC
  Administered 2014-09-06: 2.5 mg via ORAL
  Filled 2014-09-05: qty 0.5

## 2014-09-05 MED ORDER — WARFARIN SODIUM 5 MG PO TABS
5.0000 mg | ORAL_TABLET | ORAL | Status: DC
  Administered 2014-09-05: 5 mg via ORAL
  Filled 2014-09-05: qty 1

## 2014-09-05 MED ORDER — METOPROLOL TARTRATE 25 MG PO TABS
25.0000 mg | ORAL_TABLET | Freq: Two times a day (BID) | ORAL | Status: DC
  Administered 2014-09-05 – 2014-09-06 (×3): 25 mg via ORAL
  Filled 2014-09-05 (×3): qty 1

## 2014-09-05 MED ORDER — DILTIAZEM HCL 60 MG PO TABS
30.0000 mg | ORAL_TABLET | Freq: Two times a day (BID) | ORAL | Status: DC
  Administered 2014-09-05 – 2014-09-06 (×2): 30 mg via ORAL
  Filled 2014-09-05: qty 0.5

## 2014-09-05 MED ORDER — FUROSEMIDE 10 MG/ML IJ SOLN
80.0000 mg | Freq: Two times a day (BID) | INTRAMUSCULAR | Status: DC
  Administered 2014-09-05 – 2014-09-07 (×5): 80 mg via INTRAVENOUS
  Filled 2014-09-05 (×5): qty 8

## 2014-09-05 NOTE — Evaluation (Signed)
Occupational Therapy Evaluation Patient Details Name: Phillip Meyer MRN: [id number] DOB: [birth year] Today's Date: 09/05/2014    History of Present Illness Adm with pna PMHx- DM, multiple back surgeries with Lt foot drop, CHF, gout, RLE DVT   Clinical Impression   PTA pt lived at home alone and reports independence with rollator and quad cane. Pt requires min guard for functional mobility due to weakness and dyspnea. Pt reports that he will not have 24/7 Supervision.  At this time, OT feels that pt is a fall risk due to weakness and decreased cardiopulmonary status and will require 24/7 Supervision. Pt will benefit from SNF for ST Rehab to facilitate return home alone. Pt will also benefit from acute OT to address activity tolerance and strengthening as well as energy conservation and environmental modifications. SPO2 remained >94% sitting EOB for exercises. Pt performed sit<>stand x4 with pursed lip breathing and by trial 3 O2 dropped to 90% but rebounded quickly with pursed lip breathing. Pt is a mouth breather and requires VC's for pursed lip breathing.     Follow Up Recommendations  SNF;Supervision/Assistance - 24 hour    Equipment Recommendations  None recommended by OT    Recommendations for Other Services       Precautions / Restrictions Precautions Precautions: Fall;Other (comment) Precaution Comments: desaturates with activity Required Braces or Orthoses: Other Brace/Splint Other Brace/Splint: Lt AFO Restrictions Weight Bearing Restrictions: No      Mobility Bed Mobility Overal bed mobility: Modified Independent Bed Mobility: Supine to Sit     Supine to sit: Modified independent (Device/Increase time);HOB elevated     General bed mobility comments: Use of rails for support.   Transfers Overall transfer level: Needs assistance Equipment used: Rolling walker (2 wheeled) Transfers: Sit to/from Stand Sit to Stand: Min guard         General transfer comment:  Min guard for safety due to balance and weakness. Stood at EOB x4 with VC's for pursed lip breathing. Good hand placement. Controlled ascent/descent. Pt fatigued by the end.          ADL Overall ADL's : Needs assistance/impaired Eating/Feeding: Independent;Sitting   Grooming: Set up;Sitting   Upper Body Bathing: Set up;Sitting   Lower Body Bathing: Minimal assistance;Sit to/from stand   Upper Body Dressing : Set up;Sitting   Lower Body Dressing: Moderate assistance;Sit to/from stand   Toilet Transfer: Min guard;Ambulation;RW Toilet Transfer Details (indicate cue type and reason): sit<>stand from EOB x4 with VC's for pursed lip breathing           General ADL Comments: Pt with decreased activity tolerance and desating with functional mobility.      Vision Vision Assessment?: No apparent visual deficits          Pertinent Vitals/Pain Pain Assessment: No/denies pain     Hand Dominance Right   Extremity/Trunk Assessment Upper Extremity Assessment Upper Extremity Assessment: Overall WFL for tasks assessed   Lower Extremity Assessment Lower Extremity Assessment: Generalized weakness       Communication Communication Communication: No difficulties   Cognition Arousal/Alertness: Awake/alert Behavior During Therapy: Flat affect Overall Cognitive Status: Within Functional Limits for tasks assessed                        Exercises Exercises: Other exercises Other Exercises Other Exercises: pt performed Bil shoulder flexion and Bil leg kicks sitting EOB while participating in pursed lip breathing. SPO2 remained at or above 94%.    Shoulder Instructions  Home Living Family/patient expects to be discharged to:: Private residence Living Arrangements: Alone Available Help at Discharge: Available PRN/intermittently;Family (sister and neighbors) Type of Home: House Home Access: Ramped entrance     Home Layout: Two level;Able to live on main level with  bedroom/bathroom Alternate Level Stairs-Number of Steps: flight Alternate Level Stairs-Rails: Right;Left Bathroom Shower/Tub: Occupational psychologist: Handicapped height Bathroom Accessibility: Yes How Accessible: Accessible via wheelchair;Accessible via walker Home Equipment: [place] - 2 wheels;Cane - quad;Shower seat - built in;Grab bars - toilet;Grab bars - tub/shower;Walker - standard          Prior Functioning/Environment Level of Independence: Independent with assistive device(s)        Comments: RW at home and quad cane when he goes out    OT Diagnosis: Generalized weakness (decreased cardiopulmonary status)   OT Problem List: Decreased strength;Decreased activity tolerance;Impaired balance (sitting and/or standing);Cardiopulmonary status limiting activity;Increased edema   OT Treatment/Interventions: Self-care/ADL training;Therapeutic exercise;Energy conservation;DME and/or AE instruction;Therapeutic activities;Patient/family education;Balance training    OT Goals(Current goals can be found in the care plan section) Acute Rehab OT Goals Patient Stated Goal: to get home OT Goal Formulation: With patient Time For Goal Achievement: 09/19/14 Potential to Achieve Goals: Good ADL Goals Pt Will Perform Grooming: with modified independence;standing Pt Will Perform Lower Body Bathing: with modified independence;sit to/from stand Pt Will Perform Lower Body Dressing: with modified independence;sit to/from stand Pt Will Transfer to Toilet: with modified independence;ambulating Pt Will Perform Toileting - Clothing Manipulation and hygiene: with modified independence;sit to/from stand Pt Will Perform Tub/Shower Transfer: with modified independence;ambulating;Shower transfer;shower seat  OT Frequency: Min 1X/week   Barriers to D/C: Decreased caregiver support             End of Session Equipment Utilized During Treatment: Rolling walker;Oxygen  Activity Tolerance:  Patient tolerated treatment well Patient left: in bed;with call bell/phone within reach;with bed alarm set   Time: 1423-1445 OT Time Calculation (min): 22 min Charges:  OT General Charges $OT Visit: 1 Procedure OT Evaluation $Initial OT Evaluation Tier I: 1 Procedure G-Codes:    Juluis Rainier 09/23/2014, 3:03 PM  Cyndie Chime, OTR/L Occupational Therapist [phone] (pager)

## 2014-09-05 NOTE — Progress Notes (Signed)
  PROGRESS NOTE MEDICINE TEACHING ATTENDING   Day 2 of stay Patient name: Phillip Meyer   Medical record number: [id number] Date of birth: [birth year]   [name] reports feeling better, no pain while breathing however still short of breath.  Blood pressure 109/92, pulse 105, temperature 97.6 F (36.4 C), temperature source Oral,  Resp. rate 20, height 5\' 11"  (1.803 m), weight 292 lb 8.8 oz (132.7 kg), SpO2 92 %. Cardiac: Irregular irregular, no murmurs Lungs: Bibasilar rales and rhonchi. Pedal edema ++   Clinically improving - subjective report, WBC decreasing, no fever spike. We continue current therapy for community acquired pneumonia. Agree with mucinex.  We will try diuresis today given stabler blood pressures.   We continue with sotalol, and metoprol and CCB overlap for Afib.   Rest issues in Dr Trudee Kuster note from today.   I have discussed the care of this patient with my IM team residents. Please see the resident note for details.  Madilyn Fireman 09/05/2014, 12:24 PM.

## 2014-09-05 NOTE — Progress Notes (Signed)
[place] for warfarin Indication: atrial fibrillation  Allergies  Allergen Reactions  . Elavil [Amitriptyline] Other (See Comments)    Passes out    Patient Measurements: Height: 5\' 11"  (180.3 cm) Weight: 292 lb 8.8 oz (132.7 kg) IBW/kg (Calculated) : 75.3  Vital Signs: Temp: 97.6 F (36.4 C) (03/18 0459) Temp Source: Oral (03/18 0459) BP: 109/92 mmHg (03/18 0459) Pulse Rate: 105 (03/18 0459)  Labs:  Recent Labs  09/15/2014 1029 09/15/2014 1846 08/30/2014 2131 09/04/14 0834 09/05/14 0420  HGB 12.7*  --   --  12.5* 11.6*  HCT 38.8*  --   --  39.0 36.9*  PLT 206  --   --  230 257  LABPROT 27.3*  --   --  23.9* 25.7*  INR 2.51*  --   --  2.11* 2.33*  CREATININE 0.90  --   --  0.73 0.63  TROPONINI 0.03 <0.03 0.03  --   --     Estimated Creatinine Clearance: 112.6 mL/min (by C-G formula based on Cr of 0.63).    Assessment: 89 yom presented to the ED with CP and SOB. He is chronically anticoagulated for hx of afib and also had a DVT November 2015. To continue coumadin while admitted. INR remains therapeutic at 2.33. Last dose pta was on 3/13 per MD instructions at appt on Monday 3/14.   No bleeding noted. Also started on azithromycin which may increase the INR. Previous home dose was  2.5 mg TTSat and 5 mg all other days.  I interviewed pt and son on 3/17: his INR was 7.1 on Monday and he was told not to take his coumadin.  His son told him it was OK to take the coumadin on 3/16, but per the RN, the pt refused it. Son and patient think he took 5 mg dose on 3/16 but RN reports that student nurse stated he refused 3/16 dose. He took 5 mg on 3/17.     Goal of Therapy:  INR 2-3   Plan:  -resume home dose of 5 mg daily x 2.5 mg TTsat  - Daily INR  Eudelia Bunch, Pharm.D. [phone] 09/05/2014 8:15 AM

## 2014-09-05 NOTE — Progress Notes (Signed)
Patient Name: Phillip Meyer Date of Encounter: 09/05/2014     Active Problems:   IDDM (insulin dependent diabetes mellitus)   CAP (community acquired pneumonia)   HLD (hyperlipidemia)   BP (high blood pressure)   Gout of ankle   Atrial fibrillation    SUBJECTIVE  Still very SOB. LE edema worse and with some mild CP, but much improved.   CURRENT MEDS . azithromycin  500 mg Oral Q24H  . cefTRIAXone (ROCEPHIN)  IV  1 g Intravenous Q24H  . colchicine  0.6 mg Oral Daily  . dextromethorphan-guaiFENesin  1 tablet Oral BID  . diltiazem  30 mg Oral 4 times per day  . docusate sodium  200 mg Oral Daily  . ferrous sulfate  325 mg Oral Q breakfast  . furosemide  80 mg Intravenous BID  . gabapentin  600 mg Oral BID  . insulin aspart  0-15 Units Subcutaneous TID WC  . insulin aspart  0-5 Units Subcutaneous QHS  . insulin aspart  15 Units Subcutaneous TID WC  . insulin glargine  45 Units Subcutaneous QHS  . metoCLOPramide  5 mg Oral TID AC  . metoprolol tartrate  12.5 mg Oral BID  . multivitamin with minerals  1 tablet Oral Daily  . rOPINIRole  2 mg Oral BID  . simvastatin  10 mg Oral QHS  . sodium chloride  3 mL Intravenous Q12H  . sotalol  80 mg Oral BID  . warfarin  5 mg Oral Once per day on Sun Mon Wed Fri   And  . [START ON 09/06/2014] warfarin  2.5 mg Oral Once per day on Tue Thu Sat  . Warfarin - Pharmacist Dosing Inpatient   Does not apply q1800    OBJECTIVE  Filed Vitals:   09/04/14 2205 09/04/14 2356 09/05/14 0057 09/05/14 0459  BP: 138/72 130/72 100/46 109/92  Pulse: 111 94 94 105  Temp: 98 F (36.7 C)   97.6 F (36.4 C)  TempSrc: Oral   Oral  Resp: 18 20 18 20   Height:      Weight:      SpO2: 92% 95% 92% 92%    Intake/Output Summary (Last 24 hours) at 09/05/14 1416 Last data filed at 09/05/14 1245  Gross per 24 hour  Intake    480 ml  Output   1900 ml  Net  -1420 ml   Filed Weights   09/04/2014 2030 09/04/14 0500  Weight: 291 lb 0.1 oz (132 kg)  292 lb 8.8 oz (132.7 kg)    PHYSICAL EXAM  General: Well developed/obese in NAD Skin warm/dry Patient not depressed No peripheral clubbing Back-normal HEENT-normal/normal eyelids Neck supple/normal carotid upstroke bilaterally; no bruits; no thyromegaly chest - diminished breath sounds at the bases with crackles right lower lobe. CV - irregular and tachycardic/normal S1 and S2; no murmurs, rubs or gallops; PMI nondisplaced Abdomen -NT/ND, no HSM, no mass, + bowel sounds, no bruit 2+ femoral pulses, no bruits Ext-2+ edema, no chords Neuro-grossly nonfocal   Accessory Clinical Findings  CBC  Recent Labs  09/04/14 0834 09/05/14 0420  WBC 18.7* 16.3*  HGB 12.5* 11.6*  HCT 39.0 36.9*  MCV 96.3 96.3  PLT 230 092   Basic Metabolic Panel  Recent Labs  08/31/2014 1029 09/04/14 0834 09/05/14 0420  NA 137 136 135  K 3.7 3.9 3.9  CL 98 98 95*  CO2 30 29 33*  GLUCOSE 155* 186* 160*  BUN 18 12 12   CREATININE 0.90 0.73 0.63  CALCIUM 8.4 8.3* 8.2*  MG 2.2  --   --    Liver Function Tests  Recent Labs  09/17/2014 1029 09/04/14 0834  AST 42* 30  ALT 15 13  ALKPHOS 101 93  BILITOT 1.4* 1.2  PROT 6.4 6.1  ALBUMIN 2.7* 2.3*   No results for input(s): LIPASE, AMYLASE in the last 72 hours. Cardiac Enzymes  Recent Labs  09/10/2014 1029 09/02/2014 1846 09/09/2014 2131  TROPONINI 0.03 <0.03 0.03   BNP Invalid input(s): POCBNP D-Dimer No results for input(s): DDIMER in the last 72 hours. Hemoglobin A1C No results for input(s): HGBA1C in the last 72 hours. Fasting Lipid Panel No results for input(s): CHOL, HDL, LDLCALC, TRIG, CHOLHDL, LDLDIRECT in the last 72 hours. Thyroid Function Tests No results for input(s): TSH, T4TOTAL, T3FREE, THYROIDAB in the last 72 hours.  Invalid input(s): FREET3  TELE  afib with CVR.   Radiology/Studies  Dg Chest 2 View  08/28/2014   CLINICAL DATA:  Bilateral chest pain.  Shortness of breath.  EXAM: CHEST - 2 VIEW  COMPARISON:   Two-view chest x-ray 10/11/2013.  FINDINGS: The heart is enlarged. Lung volumes are low. Bilateral middle lobe and lingular airspace disease is present. No definite effusions are present. There is likely some disease in the left lower lobe as well.  IMPRESSION: 1. New bilateral airspace disease, predominately within the lingula and right middle lobe. This is most concerning for pneumonia. 2. Mild pulmonary vascular congestion is likely reactive. 3. Mild lower lobe airspace disease is present as well.   Electronically Signed   By: San Morelle M.D.   On: 09/02/2014 11:05    ASSESSMENT AND PLAN  74 year old male with past medical history of PAF on coumadin and sotalol, nonischemic cardiomyopathy ED 62-22%, chronic systolic/diastolic CHF, diabetes mellitus, hypertension admitted with pneumonia for evaluation of atrial fibrillation and no acute on chronic CHF.   Paroxysmal atrial fibrillation-the patient has recurrent atrial fibrillation. In reviewing his notes from [facility] he was seen in clinic in January 2016 and appeared to have been in sinus at that time.  -- Will continue anticoagulation as his CHADS vasc is 3. Note he also had a recent DVT. INR is therapeutic @ 2.33 -- Elevated rates likely driven by his pneumonia. Cardizem decreased to 30 mg q 6 hours and metoprolol 12.5 mg BID added yesterday. Given his nonischemic cardiomyopathy Dr Stanford Breed trying to transition from a calcium blocker to a beta blocker. Digoxin could be added if blood pressure becomes an issue. ** I will further up-titrate BB to metop 25mg  BID and decrease CCB to Cardizem 30mg  q 12h. Rates are currently well controlled w/ HR in 90s -- Would not persue DCCV at this point until his pneumonia improves (could be done as outpt at [facility] once he recovers fully from pneumonia.) -- Continue sotalol for now to help maintain sinus rhythm in the future once it is reestablished. He will need close follow-up at [facility]  following discharge.  Nonischemic cardiomyopathy- EF 40-45% -- He was on an ACE inhibitor at the time of admission. This is being held as his blood pressure is borderline. Would resume at lower dose as blood pressure improves. As outlined above, transition from his Cardizem to a beta blocker for his atrial fibrillation.  Chronic combined systolic/diastolic congestive heart failure-patient appears to be volume overloaded. -- Agree with starting IV lasix as he is SOB and has s/s CHF on exam. Home lasix was initially held due to hypotension. BPs are  improved. He is now on 80mg  IV Lasix BID. Continue to watch I/Os  Recent DVT-continue anticoagulation. DC asa.  PNA-antibiotics per primary care.  Judy Pimple PA-C  Pager [phone]

## 2014-09-05 NOTE — Progress Notes (Signed)
LOS: 2 days   Subjective: Pt states that he feels slightly better today. He denies any fever or chills, still have a non productive cough.   Objective: BP 109/92 mmHg  Pulse 105  Temp(Src) 97.6 F (36.4 C) (Oral)  Resp 20  Ht 5\' 11"  (1.803 m)  Wt 132.7 kg (292 lb 8.8 oz)  BMI 40.82 kg/m2  SpO2 92%  Intake/Output Summary (Last 24 hours) at 09/05/14 0831 Last data filed at 09/05/14 0230  Gross per 24 hour  Intake    720 ml  Output   2200 ml  Net  -1480 ml    Physical Exam: General: WDWN, healthy, cooperative, awake and alert, Sitting on his recliner.  Skin: Skin color, texture, turgor normal. No rashes or lesions. HEENT:  Head : Atraumatic, normocephalic. Eye: PERRL, sclera anicteric, no conjunctival injection Nose: no nasal congestion.patent, no discharge Throat: No tonsillar erythema, exudates or enlargement. Mouth: Moist mucus, no lesions, dentition good. Neck: supple, no LAD Heart: Pulse regular rate and rhythm, normal S1S2, no murmurs appreciated.  Chest/ Lung: Dyspneic.  No tenderness to palpation Lungs sounds:Diminished lung sounds , diffuse crackles bilaterally.  Abdomen: Soft, NTND, +BS, no hepatosplenomegaly noted.  Extremities: LE edema + 3. Warm and well perfused, no clubbing/cyanosis. Radial, pedal pulse +2.  Neuro: CN III-XII grossly intact, sensation intact, spontaneous movement of all limbs, no obvious deficits Labs/Studies: I have reviewed labs and studies from last 24hrs per EMR.  Medications: I have reviewed the patient's current medications.  Assessment/Plan: Active Problems:   IDDM (insulin dependent diabetes mellitus)   CAP (community acquired pneumonia)   HLD (hyperlipidemia)   BP (high blood pressure)   Gout of ankle   Atrial fibrillation   Phillip Meyer is a 74 y.o. male with PMHx of Phillip Meyer is a 74 yo male with an history of type 2 diabetes, hypertension, congestive heart failure, hyperlipidemia, A-fib, and gout who  presents with a productive cough and bilateral chest pain on inspiration  #  CAP:  WBC count keeps on improving today is 16.3 from 18.7 yesterday. Blood culture collected and still no grow to date. Strept pneumonia urine antigen negative, Legionella antigen also negative. Dyspnea on exertion still present.  - Oxygen as needed -blood culture pending. -continue Rocephin IV -Continue Zithromax IV -continue incentive spirometry -flutter valve  ## Congestive heart failure. Pt had BNP mildly elevated at 270.5 on admission, he states that he takes 200 mg of lasix at home on a daily basis, lasix have been on hold since his admission due to low blood pressure. Lung auscultation today his marked with reduces lungs sounds and crackles, he also has lower extremity edema.   - restart lasix today, give lasix 80 mg IV twice daily.  -continue home diltiazem -hold home enalapril d/t BP still soft.  -strict I & O -Daily weight -fluid restriction.   ## Atrial fibrillation : pt has chronic A-fib and he takes Cardizem 120 mg daily, Sotalol 80 mg BID and takes Coumadin for anticoagulation. INR is therapeutic today at 2.33. Pt home Sotalol was restarted yesterday and new order for Metoprolol 12.5 mg BID to be increased progressively  in order to wean him off the Cardizem ( per cardiology) Pt is currently in A-fib rate, heart rate is still  slightly increased today in the low 100's.   - Continue home Sotalol -continue Metoprolol 12.5 mg BID -Continue Cardizem 30 mg Q6 h.  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 He takes Gabapentin for his neuropathy and Reglan for gastroparesis. CBG has been between 147-198.  - continue SSI - Lantus at 45 U at bedtime - Novolog 15 U TID - gabapentin 600mg  QID -  Reglan 5 mg before meals.   # Gout:  - continue home  Colchicine  # Hypertension: pt home regiment includes diltiazem 120 mg daily, Enalapril 20 mg daily, Sotalol 80 mg BID and reportedly Lasix 200 mg daily . His lasix has been on hold since admission due to low BP but will be restarted today since BP is improved, in the 001'V for the systolic and 49'S-49'Q for the diastolic. His Sotalol was also restarted yesterday and he has a new order for Metoprolol 12.5 mg BID given for his Atrial fibrillation as discussed above.  -Continue  Diltiazem 30 mg Q 6 h - Hold Enalapril -Continue home Sotalol -Continue Metoprolol 12.5 mg BID -Start lasix IV  80 mg BID   # Hyperlipidemia :  -Continue home Zocor 10 mg.   # FEN:  -Diet: Carb Modified  #DVT prophylaxis: heparin  CODE STATUS: FULL CODE   This is a Careers information officer Note. The care of the patient was discussed with Phillip Meyer and the assessment and plan was formulated with their assistance. Please see their note for official documentation of the patient encounter.

## 2014-09-05 NOTE — Progress Notes (Signed)
Subjective:   Patient reports that he is feeling slightly better although his breathing is still a bit compromised. Patient denying any other complaints this morning.   Objective: Vital signs in last 24 hours: Filed Vitals:   09/04/14 2205 09/04/14 2356 09/05/14 0057 09/05/14 0459  BP: 138/72 130/72 100/46 109/92  Pulse: 111 94 94 105  Temp: 98 F (36.7 C)   97.6 F (36.4 C)  TempSrc: Oral   Oral  Resp: 18 20 18 20   Height:      Weight:      SpO2: 92% 95% 92% 92%   Weight change:   Intake/Output Summary (Last 24 hours) at 09/05/14 1211 Last data filed at 09/05/14 0230  Gross per 24 hour  Intake    480 ml  Output   1500 ml  Net  -1020 ml    General: resting in bed, nasal cannula in place, currently on 5 L nasal cannula HEENT: PERRL, EOMI, no scleral icterus Cardiac: Irregularly irregular, tachycardic, no rubs, murmurs or gallops Pulm: Bibasilar rales and diffuse rhonchi, moving normal volumes of air Abd: soft, nontender, nondistended, BS present Ext: warm and well perfused, 2+ bilateral lower extremity edema Neuro: alert and oriented X3, cranial nerves II-XII grossly intact Skin: no rashes or lesions noted Psych: appropriate affect and cognition  Lab Results: Basic Metabolic Panel:  Recent Labs Lab 08/23/2014 1029 09/04/14 0834 09/05/14 0420  NA 137 136 135  K 3.7 3.9 3.9  CL 98 98 95*  CO2 30 29 33*  GLUCOSE 155* 186* 160*  BUN 18 12 12   CREATININE 0.90 0.73 0.63  CALCIUM 8.4 8.3* 8.2*  MG 2.2  --   --    Liver Function Tests:  Recent Labs Lab 08/27/2014 1029 09/04/14 0834  AST 42* 30  ALT 15 13  ALKPHOS 101 93  BILITOT 1.4* 1.2  PROT 6.4 6.1  ALBUMIN 2.7* 2.3*   No results for input(s): LIPASE, AMYLASE in the last 168 hours. No results for input(s): AMMONIA in the last 168 hours. CBC:  Recent Labs Lab 09/04/14 0834 09/05/14 0420  WBC 18.7* 16.3*  HGB 12.5* 11.6*  HCT 39.0 36.9*  MCV 96.3 96.3  PLT 230 257   Cardiac  Enzymes:  Recent Labs Lab 08/28/2014 1029 08/31/2014 1846 09/02/2014 2131  TROPONINI 0.03 <0.03 0.03   BNP: No results for input(s): PROBNP in the last 168 hours. D-Dimer: No results for input(s): DDIMER in the last 168 hours. CBG:  Recent Labs Lab 09/04/14 1418 09/04/14 1711 09/04/14 1957 09/04/14 2300 09/05/14 0740 09/05/14 1135  GLUCAP 164* 167* 198* 190* 147* 201*   Hemoglobin A1C: No results for input(s): HGBA1C in the last 168 hours. Fasting Lipid Panel: No results for input(s): CHOL, HDL, LDLCALC, TRIG, CHOLHDL, LDLDIRECT in the last 168 hours. Thyroid Function Tests: No results for input(s): TSH, T4TOTAL, FREET4, T3FREE, THYROIDAB in the last 168 hours. Coagulation:  Recent Labs Lab 09/12/2014 1029 09/04/14 0834 09/05/14 0420  LABPROT 27.3* 23.9* 25.7*  INR 2.51* 2.11* 2.33*   Anemia Panel: No results for input(s): VITAMINB12, FOLATE, FERRITIN, TIBC, IRON, RETICCTPCT in the last 168 hours. Urine Drug Screen: Drugs of Abuse  No results found for: LABOPIA, COCAINSCRNUR, LABBENZ, AMPHETMU, THCU, LABBARB  Alcohol Level: No results for input(s): ETH in the last 168 hours. Urinalysis: No results for input(s): COLORURINE, LABSPEC, PHURINE, GLUCOSEU, HGBUR, BILIRUBINUR, KETONESUR, PROTEINUR, UROBILINOGEN, NITRITE, LEUKOCYTESUR in the last 168 hours.  Invalid input(s): APPERANCEUR  Micro Results: Recent Results (from the past  240 hour(s))  Culture, blood (routine x 2) Call MD if unable to obtain prior to antibiotics being given     Status: None (Preliminary result)   Collection Time: 08/27/2014  6:46 PM  Result Value Ref Range Status   Specimen Description BLOOD RIGHT ANTECUBITAL  Final   Special Requests BOTTLES DRAWN AEROBIC AND ANAEROBIC 5CC  Final   Culture   Final           BLOOD CULTURE RECEIVED NO GROWTH TO DATE CULTURE WILL BE HELD FOR 5 DAYS BEFORE ISSUING A FINAL NEGATIVE REPORT Performed at [facility]    Report Status PENDING  Incomplete   Culture, blood (routine x 2) Call MD if unable to obtain prior to antibiotics being given     Status: None (Preliminary result)   Collection Time: 08/23/2014  6:57 PM  Result Value Ref Range Status   Specimen Description BLOOD RIGHT HAND  Final   Special Requests BOTTLES DRAWN AEROBIC ONLY 3CC  Final   Culture   Final           BLOOD CULTURE RECEIVED NO GROWTH TO DATE CULTURE WILL BE HELD FOR 5 DAYS BEFORE ISSUING A FINAL NEGATIVE REPORT Performed at [facility]    Report Status PENDING  Incomplete   Studies/Results: No results found. Medications: I have reviewed the patient's current medications. Scheduled Meds: . azithromycin  500 mg Oral Q24H  . cefTRIAXone (ROCEPHIN)  IV  1 g Intravenous Q24H  . colchicine  0.6 mg Oral Daily  . dextromethorphan-guaiFENesin  1 tablet Oral BID  . diltiazem  30 mg Oral 4 times per day  . docusate sodium  200 mg Oral Daily  . ferrous sulfate  325 mg Oral Q breakfast  . furosemide  80 mg Intravenous BID  . gabapentin  600 mg Oral BID  . insulin aspart  0-15 Units Subcutaneous TID WC  . insulin aspart  0-5 Units Subcutaneous QHS  . insulin aspart  15 Units Subcutaneous TID WC  . insulin glargine  45 Units Subcutaneous QHS  . metoCLOPramide  5 mg Oral TID AC  . metoprolol tartrate  12.5 mg Oral BID  . multivitamin with minerals  1 tablet Oral Daily  . rOPINIRole  2 mg Oral BID  . simvastatin  10 mg Oral QHS  . sodium chloride  3 mL Intravenous Q12H  . sotalol  80 mg Oral BID  . warfarin  5 mg Oral Once per day on Sun Mon Wed Fri   And  . [START ON 09/06/2014] warfarin  2.5 mg Oral Once per day on Tue Thu Sat  . Warfarin - Pharmacist Dosing Inpatient   Does not apply q1800   Continuous Infusions:  PRN Meds:.ipratropium-albuterol, morphine injection, traMADol Assessment/Plan: Active Problems:   IDDM (insulin dependent diabetes mellitus)   CAP (community acquired pneumonia)   HLD (hyperlipidemia)   BP (high blood pressure)   Gout of  ankle   Atrial fibrillation  Patient is a 74 year old male with a history of type 2 diabetes, hypertension, congestive heart failure, hyperlipidemia, gout who presents with a productive cough consistent with community-acquired pneumonia.   Community Acquired Pneumonia: Patient has some continued improvement though modest in his breathing status. Physical exam is stable. Patient still having a supplemental oxygen requirement. Leukocytosis is trending down to 16.3 from 18.7. Patient is still exhibiting some concurrent signs of mild acute on chronic congestive heart failure which may also be compromising his respiratory status. Urinary strep  and legionella negative. -Continue day 3 of ceftriaxone and azithromycin (planned end date of 09/07/2014) -Continue home tramadol for chest and abdominal discomfort. -Mucinex for cough -Blood cultures no growth to date -Incentive spirometry -Flutter valve and DuoNeb's when necessary  Paroxysmal atrial fibrillation: Patient persistently in atrial fibrillation during this hospitalization. Patient noted to have been in sinus rhythm as an outpatient from [facility] in January 2016. Patient's chads vasc score is 3. Patient is on diltiazem 120 mg daily, sotalol 80 mg twice a day. Patient is currently on anticoagulation with Coumadin. -Appreciate cardiology recommendations -Diltiazem 30 mg every 6 hours. -Metoprolol 12.5 mg twice a day -Continue home sotalol to help maintain sinus rhythm once is reestablished. -Continue with Coumadin anticoagulation. -Discontinue home aspirin.  Systolic congestive heart failure: Patient exhibiting persistent signs of mild fluid overload on lung exam with edema in extremities as well. Previously low blood pressures have now improved which makes diuresis possible. Echocardiogram from 02/21/2014 in care everywhere from [facility] indicating an EF of 40-45% with mild global hypokinesis more pronounced in the distal inferior  wall and apical septum. Patient is currently on Lasix and is reportedly on Lasix 40 mg tablets 3 in the morning and 2 tablets at dinner totaling a 200 mg dosage daily. At home, patient is also on diltiazem 120 mg daily, enalapril 20 mg daily, sotalol 80 mg twice a day. -Restart diuresis today. Lasix 80 mg twice a day -Monitor ins and outs  Hypertension: Blood pressures are slightly improved over the last 24 hours at around 100s-130s/70s-90s. At home, patient is on diltiazem 120 mg daily, enalapril 20 mg daily, Lasix reportedly up to 200 mg daily, sotalol 80 mg twice a day.  -Hold home enalapril given low blood pressures upon initial evaluation.  -Blood pressure management as above with paroxysmal atrial fibrillation.  Type two diabetes: Patient's blood glucose have been moderately well controlled over the last 24 hours. Home regimen is lantus 60 QHS and novolog 30 unit TIDAC. Patient has been normoglycemic though on the lower limit of normal at 70 occasionally. -Consider resuming home Lantus of 60 units daily at bedtime. -Continue NovoLog 15 3 times a day before meals  -Moderate sliding scale insulin with nighttime correction   Gout: Patient states that he's having a current acute gout flare in his left ankle. Patient currently on colchicine 0.6 mg 4 times a day. It is noted that there is a interaction with diltiazem. -Continue home colchicine at a lower dose of 0.6 mg daily.  Iron deficiency anemia: Patient is on 325 mg ferrous sulfate daily.  -Continue home ferrous sulfate.   Neuropathic pain: Patient is on gabapentin 600 mg twice a day.  -Continue home gabapentin   Diet: Carbohydrate modified  Prophylaxis: Patient currently on Coumadin and therapeutic  Code status: DNR/DNI, POA is son, Artem Bunte cell [phone])  Dispo: Disposition is deferred at this time, awaiting improvement of current medical problems. Anticipated discharge in approximately 2 day(s).   The patient does have  a current PCP Zorita Pang, MD) and does need an [facility] hospital follow-up appointment after discharge.  The patient does not have transportation limitations that hinder transportation to clinic appointments.    LOS: 2 days   Services Needed at time of discharge: Y = Yes, Blank = No PT:   OT:   RN:   Equipment:   Other:    Luan Moore, MD 09/05/2014, 12:11 PM

## 2014-09-05 NOTE — Progress Notes (Signed)
Physical Therapy Treatment Patient Details Name: Phillip Meyer MRN: [id number] DOB: [birth year] Today's Date: 09/05/2014    History of Present Illness Adm with pna PMHx- DM, multiple back surgeries with Lt foot drop, CHF, gout, RLE DVT    PT Comments    Patient progressing well with mobility. Improved ambulation distance today however continues to have a drop in Sa02 on 6L 02 [place] with activity requiring seated rest break and cues for pursed lip breathing. Education provided on energy conservation techniques and the importance of performing short bouts of activity due to SOB/drop in Sa02. Will continue to slowly increase activity level while monitoring Sa02. Will continue to follow per current POC.   Follow Up Recommendations  Home health PT;Supervision/Assistance - 24 hour     Equipment Recommendations  None recommended by PT    Recommendations for Other Services       Precautions / Restrictions Precautions Precautions: Fall;Other (comment) Precaution Comments: desaturates with activity Required Braces or Orthoses: Other Brace/Splint Other Brace/Splint: Lt AFO Restrictions Weight Bearing Restrictions: No    Mobility  Bed Mobility Overal bed mobility: Needs Assistance Bed Mobility: Supine to Sit     Supine to sit: Modified independent (Device/Increase time);HOB elevated     General bed mobility comments: Use of rails for support.   Transfers Overall transfer level: Needs assistance Equipment used: Rolling walker (2 wheeled) Transfers: Sit to/from Stand Sit to Stand: Min guard         General transfer comment: Min guard to rise from EOB x2 with cues for hand placement, x1 from chair. Pt wanting to pull up RW to stand initially. Transferred to chair at end of session.  Ambulation/Gait Ambulation/Gait assistance: Min guard Ambulation Distance (Feet): 50 Feet (x2 bouts) Assistive device: Rolling walker (2 wheeled) Gait Pattern/deviations: Step-through  pattern;Decreased stride length;Trunk flexed   Gait velocity interpretation: Below normal speed for age/gender General Gait Details: Pt with increased hip flexion bil during gait secondary to back surgeries. Sa02 dropped to 60s on 6L 02 [place] during gait requiring seated rest break. Asymptomatic. Cues for pursed lip breathing. Pt is a mouth breather. Resolved quickly to >90% after 1 minute. Ranged from 70s-90s post ambulation bout.    Stairs            Wheelchair Mobility    Modified Rankin (Stroke Patients Only)       Balance Overall balance assessment: Needs assistance Sitting-balance support: Feet supported;No upper extremity supported Sitting balance-Leahy Scale: Good Sitting balance - Comments: Total A to donn socks, shoes and AFO   Standing balance support: During functional activity Standing balance-Leahy Scale: Poor Standing balance comment: flexed trunk.                     Cognition Arousal/Alertness: Awake/alert Behavior During Therapy: Flat affect Overall Cognitive Status: Within Functional Limits for tasks assessed                      Exercises      General Comments General comments (skin integrity, edema, etc.): Son and sister present during session.       Pertinent Vitals/Pain Pain Assessment: No/denies pain    Home Living                      Prior Function            PT Goals (current goals can now be found in the care plan section) Progress towards PT goals:  Progressing toward goals    Frequency  Min 3X/week    PT Plan Current plan remains appropriate    Co-evaluation             End of Session Equipment Utilized During Treatment: Oxygen;Gait belt Activity Tolerance: Treatment limited secondary to medical complications (Comment) (drop in Sa02) Patient left: in chair;with call bell/phone within reach;with family/visitor present     Time: 1655-3748 PT Time Calculation (min) (ACUTE ONLY): 25  min  Charges:  $Gait Training: 8-22 mins $Therapeutic Exercise: 8-22 mins                    G CodesCandy Sledge A 2014/09/20, 1:01 PM  Candy Sledge, [place], DPT [phone]

## 2014-09-05 NOTE — Care Management (Signed)
IM from Medicare given to Patient. Magdalen Spatz RN BSN

## 2014-09-06 DIAGNOSIS — I4891 Unspecified atrial fibrillation: Secondary | ICD-10-CM

## 2014-09-06 DIAGNOSIS — R609 Edema, unspecified: Secondary | ICD-10-CM

## 2014-09-06 LAB — GLUCOSE, CAPILLARY
GLUCOSE-CAPILLARY: 148 mg/dL — AB (ref 70–99)
GLUCOSE-CAPILLARY: 151 mg/dL — AB (ref 70–99)
GLUCOSE-CAPILLARY: 46 mg/dL — AB (ref 70–99)
Glucose-Capillary: 67 mg/dL — ABNORMAL LOW (ref 70–99)
Glucose-Capillary: 88 mg/dL (ref 70–99)
Glucose-Capillary: 190 mg/dL — ABNORMAL HIGH (ref 70–99)
Glucose-Capillary: 99 mg/dL (ref 70–99)

## 2014-09-06 LAB — BASIC METABOLIC PANEL
Anion gap: 5 (ref 5–15)
BUN: 16 mg/dL (ref 6–23)
CALCIUM: 8.4 mg/dL (ref 8.4–10.5)
CO2: 38 mmol/L — ABNORMAL HIGH (ref 19–32)
Chloride: 93 mmol/L — ABNORMAL LOW (ref 96–112)
Creatinine, Ser: 0.67 mg/dL (ref 0.50–1.35)
GFR calc Af Amer: >90 mL/min (ref >90)
GLUCOSE: 93 mg/dL (ref 70–99)
POTASSIUM: 4.4 mmol/L (ref 3.5–5.1)
SODIUM: 136 mmol/L (ref 135–145)

## 2014-09-06 LAB — PROTIME-INR
INR: 2.87 — ABNORMAL HIGH (ref 0.00–1.49)
Prothrombin Time: 30.3 seconds — ABNORMAL HIGH (ref 11.6–15.2)

## 2014-09-06 MED ORDER — DILTIAZEM HCL 60 MG PO TABS
30.0000 mg | ORAL_TABLET | Freq: Four times a day (QID) | ORAL | Status: DC
  Administered 2014-09-06 (×2): 30 mg via ORAL
  Filled 2014-09-06: qty 0.5

## 2014-09-06 MED ORDER — INSULIN GLARGINE 100 UNIT/ML ~~LOC~~ SOLN
40.0000 [IU] | Freq: Every day | SUBCUTANEOUS | Status: DC
  Administered 2014-09-06: 40 [IU] via SUBCUTANEOUS
  Filled 2014-09-06: qty 0.4

## 2014-09-06 MED ORDER — DILTIAZEM HCL 60 MG PO TABS: 30.0000 mg | ORAL_TABLET | Freq: Four times a day (QID) | ORAL | Status: DC

## 2014-09-06 NOTE — Plan of Care (Signed)
Problem: Phase I Progression Outcomes Goal: Pain controlled with appropriate interventions Outcome: Progressing Pain controlled with same medication as at home.

## 2014-09-06 NOTE — Progress Notes (Signed)
Patient ID: Phillip Meyer, male   DOB: [birth year], 74 y.o.   MRN: [id number] Medicine attending progress note: Clinical history, problem list, and management plan, reviewed with resident physician Dr. Joni Reining and I concur with his evaluation and plan. 74 year old man admitted with a exacerbation of chronic congestive heart failure secondary to community-acquired pneumonia. He has history of atrial fibrillation on chronic anticoagulation. On exam there was a discrepancy in the circumference of his right calf compared with the left. Doppler study shows subacute clot in deep veins. Of note, warfarin level was subtherapeutic on admission. Warfarin dose will be adjusted.

## 2014-09-06 NOTE — Plan of Care (Signed)
Problem: Phase I Progression Outcomes Goal: OOB as tolerated unless otherwise ordered Outcome: Progressing Up to Bathroom and to chair. Heart rate increases significantly with exertion

## 2014-09-06 NOTE — Progress Notes (Signed)
0445 Patient awaken very confused oriented to person only. Patient remove pulse ox finger probe and oxygen. Patient reoriented to place and time. Patient more oriented now O2 on via nasal cannula sats 90 to 93%.  Will continue to monitor. Bed alarm on.

## 2014-09-06 NOTE — Progress Notes (Signed)
Patient ID: Phillip Meyer, male   DOB: 03/31/1941, 74 y.o.   MRN: [id number]     Subjective:    Continued SOB  Objective:   Temp:  [98.1 F (36.7 C)-98.5 F (36.9 C)] 98.1 F (36.7 C) (03/19 0524) Pulse Rate:  [94-379] 94 (03/19 0524) Resp:  [21-24] 22 (03/19 0524) BP: (98-113)/(41-64) 113/64 mmHg (03/19 0524) SpO2:  [94 %-96 %] 94 % (03/19 0524) Weight:  [289 lb 11 oz (131.4 kg)] 289 lb 11 oz (131.4 kg) (03/19 0524) Last BM Date: 09/04/14  Filed Weights   08/26/2014 2030 09/04/14 0500 09/06/14 0524  Weight: 291 lb 0.1 oz (132 kg) 292 lb 8.8 oz (132.7 kg) 289 lb 11 oz (131.4 kg)    Intake/Output Summary (Last 24 hours) at 09/06/14 1005 Last data filed at 09/06/14 0858  Gross per 24 hour  Intake    440 ml  Output   4650 ml  Net  -4210 ml    Telemetry: afib rates 120-140s  Exam:  General: NAD  Resp: coarse bilaterally  Cardiac: irreg, rate 115, no m/r/g, no JVD  CB:SWHQPRF soft, NT, ND  MSK: 2= bilateral LE edema  Neuro: no focal deficits  Psych: appropirate affect  Lab Results:  Basic Metabolic Panel:  Recent Labs Lab 08/22/2014 1029 09/04/14 0834 09/05/14 0420 09/06/14 0441  NA 137 136 135 136  K 3.7 3.9 3.9 4.4  CL 98 98 95* 93*  CO2 30 29 33* 38*  GLUCOSE 155* 186* 160* 93  BUN 18 12 12 16   CREATININE 0.90 0.73 0.63 0.67  CALCIUM 8.4 8.3* 8.2* 8.4  MG 2.2  --   --   --     Liver Function Tests:  Recent Labs Lab 09/12/2014 1029 09/04/14 0834  AST 42* 30  ALT 15 13  ALKPHOS 101 93  BILITOT 1.4* 1.2  PROT 6.4 6.1  ALBUMIN 2.7* 2.3*    CBC:  Recent Labs Lab 09/17/2014 1029 09/04/14 0834 09/05/14 0420  WBC 19.2* 18.7* 16.3*  HGB 12.7* 12.5* 11.6*  HCT 38.8* 39.0 36.9*  MCV 95.1 96.3 96.3  PLT 206 230 257    Cardiac Enzymes:  Recent Labs Lab 09/07/2014 1029 09/09/2014 1846 09/06/2014 2131  TROPONINI 0.03 <0.03 0.03    BNP: No results for input(s): PROBNP in the last 8760 hours.  Coagulation:  Recent Labs Lab  09/04/14 0834 09/05/14 0420 09/06/14 0441  INR 2.11* 2.33* 2.87*    ECG:   Medications:   Scheduled Medications: . azithromycin  500 mg Oral Q24H  . cefTRIAXone (ROCEPHIN)  IV  1 g Intravenous Q24H  . colchicine  0.6 mg Oral Daily  . dextromethorphan-guaiFENesin  1 tablet Oral BID  . diltiazem  30 mg Oral Q12H  . docusate sodium  200 mg Oral Daily  . ferrous sulfate  325 mg Oral Q breakfast  . furosemide  80 mg Intravenous BID  . gabapentin  600 mg Oral BID  . insulin aspart  0-15 Units Subcutaneous TID WC  . insulin aspart  0-5 Units Subcutaneous QHS  . insulin aspart  15 Units Subcutaneous TID WC  . insulin glargine  45 Units Subcutaneous QHS  . metoCLOPramide  5 mg Oral TID AC  . metoprolol tartrate  25 mg Oral BID  . multivitamin with minerals  1 tablet Oral Daily  . rOPINIRole  2 mg Oral BID  . simvastatin  10 mg Oral QHS  . sodium chloride  3 mL Intravenous Q12H  . sotalol  80 mg Oral BID  . warfarin  5 mg Oral Once per day on Sun Mon Wed Fri   And  . warfarin  2.5 mg Oral Once per day on Tue Thu Sat  . Warfarin - Pharmacist Dosing Inpatient   Does not apply q1800     Infusions:     PRN Medications:  ipratropium-albuterol, morphine injection, traMADol   02/2014 echo SUMMARY There is normal left ventricular wall thickness. The left ventricular size is normal.  Left ventricular systolic function is mildly reduced. LV ejection fraction = 40-45%. There is mild global hypokinesis of the left ventricle more pronounced along  the distal inferior wall and apical septum.  Left ventricular filling pattern is indeterminate. There is evidence of elevated left atrial pressures. The right ventricle is borderline dilated. The right ventricular systolic function is borderline reduced. The left atrium is moderately dilated. There is mild mitral regurgitation. There is no pericardial effusion. No signficant change from study dated 11/22/2012.    Assessment/Plan    1. Chronic combined systolic/diastolic HF - echo 09/4816 [facility] LVEF 40-45%, indeterminate diastolic function but evidence of elevate LA pressures, borderline reduced RV function. - CXR with pneumonia, mild congestion thought likely to be reactive. BNP 270. Wt outpatient appt Jan 2016 130 lbs, admit weight 132 lbs.  - he has been on IV lasix 80mg  bid, negative 5.5 liters since admission. Cr and BUN trending down consistent with venous congestion. Still with severe bilateral LE edema, some crackles on lung exam - continue IV lasix today  2. Pneumonia - per primary team  3. Afib - on oral dilt 30mg  bid, metoprolol 25mg  bid, sotalol 80mg  bid, and coumadin -Some question from prior notes about titrating up dilt vs metoprolol for better rate control given his mild systolic dysfuction. From primary cardiologists notes appears he he was on dilt long acting 120mg  daily, coumadin, and sotalol 80mg  bid. Will continue with there strategy and titrate up his diltiazem, with only mild LV systolic dysfunction this is reasonable option and not strictly contraindicated. Defer to primary if would consider changing to beta blocker at some point in follow up. Start dilt 30mg  q6 hrs with hold parameters.          Carlyle Dolly, M.D.

## 2014-09-06 NOTE — Progress Notes (Addendum)
Subjective:   Phillip Meyer.  Patient still continues to have SOB, non productive cough.  Legs are more swollen today.   Objective: Vital signs in last 24 hours: Filed Vitals:   09/05/14 0459 09/05/14 1546 09/05/14 2126 09/06/14 0524  BP: 109/92 98/41 109/62 113/64  Pulse: 105 379 107 94  Temp: 97.6 F (36.4 C) 98.5 F (36.9 C) 98.1 F (36.7 C) 98.1 F (36.7 C)  TempSrc: Oral Oral Oral Oral  Resp: 20 21 24 22   Height:      Weight:    289 lb 11 oz (131.4 kg)  SpO2: 92% 96% 94% 94%   Weight change:   Intake/Output Summary (Last 24 hours) at 09/06/14 1047 Last data filed at 09/06/14 1029  Gross per 24 hour  Intake    680 ml  Output   4650 ml  Net  -3970 ml    General: resting in bed, nasal cannula in place, currently on 5 L nasal cannula Cardiac: Irregularly irregular, tachycardic, no rubs, murmurs or gallops Pulm: Bibasilar rales and diffuse rhonchi, moving normal volumes of air Abd: soft, nontender, nondistended, BS present Ext: warm and well perfused, 2+ bilateral lower extremity edema, with right leg circumference greater than left Neuro: alert and oriented X3, cranial nerves II-XII grossly intact Skin: no rashes or lesions noted Psych: appropriate affect and cognition  Lab Results: Basic Metabolic Panel:  Recent Labs Lab 08/31/2014 1029  09/05/14 0420 09/06/14 0441  NA 137  < > 135 136  K 3.7  < > 3.9 4.4  CL 98  < > 95* 93*  CO2 30  < > 33* 38*  GLUCOSE 155*  < > 160* 93  BUN 18  < > 12 16  CREATININE 0.90  < > 0.63 0.67  CALCIUM 8.4  < > 8.2* 8.4  MG 2.2  --   --   --   < > = values in this interval not displayed. Liver Function Tests:  Recent Labs Lab 08/20/2014 1029 09/04/14 0834  AST 42* 30  ALT 15 13  ALKPHOS 101 93  BILITOT 1.4* 1.2  PROT 6.4 6.1  ALBUMIN 2.7* 2.3*   No results for input(s): LIPASE, AMYLASE in the last 168 hours. No results for input(s): AMMONIA in the last 168 hours. CBC:  Recent Labs Lab 09/04/14 0834 09/05/14 0420    WBC 18.7* 16.3*  HGB 12.5* 11.6*  HCT 39.0 36.9*  MCV 96.3 96.3  PLT 230 257   Cardiac Enzymes:  Recent Labs Lab 09/02/2014 1029 09/02/2014 1846 09/02/2014 2131  TROPONINI 0.03 <0.03 0.03   BNP: No results for input(s): PROBNP in the last 168 hours. D-Dimer: No results for input(s): DDIMER in the last 168 hours. CBG:  Recent Labs Lab 09/05/14 0740 09/05/14 1135 09/05/14 1705 09/05/14 2128 09/06/14 0744 09/06/14 0806  GLUCAP 147* 201* 168* 119* 67* 88   Coagulation:  Recent Labs Lab 08/30/2014 1029 09/04/14 0834 09/05/14 0420 09/06/14 0441  LABPROT 27.3* 23.9* 25.7* 30.3*  INR 2.51* 2.11* 2.33* 2.87*    Micro Results: Recent Results (from the past 240 hour(s))  Culture, blood (routine x 2) Call MD if unable to obtain prior to antibiotics being given     Status: None (Preliminary result)   Collection Time: 08/25/2014  6:46 PM  Result Value Ref Range Status   Specimen Description BLOOD RIGHT ANTECUBITAL  Final   Special Requests BOTTLES DRAWN AEROBIC AND ANAEROBIC 5CC  Final   Culture   Final  BLOOD CULTURE RECEIVED NO GROWTH TO DATE CULTURE WILL BE HELD FOR 5 DAYS BEFORE ISSUING A FINAL NEGATIVE REPORT Performed at [facility]    Report Status PENDING  Incomplete  Culture, blood (routine x 2) Call MD if unable to obtain prior to antibiotics being given     Status: None (Preliminary result)   Collection Time: 08/23/2014  6:57 PM  Result Value Ref Range Status   Specimen Description BLOOD RIGHT HAND  Final   Special Requests BOTTLES DRAWN AEROBIC ONLY 3CC  Final   Culture   Final           BLOOD CULTURE RECEIVED NO GROWTH TO DATE CULTURE WILL BE HELD FOR 5 DAYS BEFORE ISSUING A FINAL NEGATIVE REPORT Performed at [facility]    Report Status PENDING  Incomplete   Studies/Results: No results found. Medications: I have reviewed the patient's current medications. Scheduled Meds: . azithromycin  500 mg Oral Q24H  . cefTRIAXone (ROCEPHIN)   IV  1 g Intravenous Q24H  . colchicine  0.6 mg Oral Daily  . dextromethorphan-guaiFENesin  1 tablet Oral BID  . diltiazem  30 mg Oral 4 times per day  . docusate sodium  200 mg Oral Daily  . ferrous sulfate  325 mg Oral Q breakfast  . furosemide  80 mg Intravenous BID  . gabapentin  600 mg Oral BID  . insulin aspart  0-15 Units Subcutaneous TID WC  . insulin aspart  0-5 Units Subcutaneous QHS  . insulin aspart  15 Units Subcutaneous TID WC  . insulin glargine  40 Units Subcutaneous QHS  . metoCLOPramide  5 mg Oral TID AC  . metoprolol tartrate  25 mg Oral BID  . multivitamin with minerals  1 tablet Oral Daily  . rOPINIRole  2 mg Oral BID  . simvastatin  10 mg Oral QHS  . sodium chloride  3 mL Intravenous Q12H  . sotalol  80 mg Oral BID  . warfarin  5 mg Oral Once per day on Sun Mon Wed Fri   And  . warfarin  2.5 mg Oral Once per day on Tue Thu Sat  . Warfarin - Pharmacist Dosing Inpatient   Does not apply q1800   Continuous Infusions:  PRN Meds:.ipratropium-albuterol, morphine injection, traMADol Assessment/Plan: Active Problems:   IDDM (insulin dependent diabetes mellitus)   CAP (community acquired pneumonia)   HLD (hyperlipidemia)   BP (high blood pressure)   Gout of ankle   Atrial fibrillation  Patient is a 74 year old male with a history of type 2 diabetes, hypertension, congestive heart failure, hyperlipidemia, gout who presents with a productive cough consistent with community-acquired pneumonia.   Community Acquired Pneumonia:  Afebrile, BCx 3/16> 2/2NGTD, sputum not collected -Continue day 4 of ceftriaxone and azithromycin (planned end date of 09/07/2014) -Continue home tramadol for chest and abdominal discomfort. -Mucinex for cough -Incentive spirometry -Flutter valve and DuoNeb's when necessary  Asymmetric swelling of RLE - Patient has history of DVT in right leg.  He is currently on A./C and therapeutic for A fib.  Likely this asymmetry represents chronic  effects after his previous DVT. ADDENDEM- Discussed with Dr. Beryle Beams, given A/C no need for Venous duplex.  I have discontinued this order  Paroxysmal atrial fibrillation:  -Appreciate cardiology recommendations -Diltiazem 30 mg every 6 hours. -Metoprolol 12.5 mg twice a day -Continue home sotalol to help maintain sinus rhythm once is reestablished. -Continue with Coumadin anticoagulation.  Systolic congestive heart failure:  - Neg  neg 5.2L for stay 3.3 L overnight.  Wt documented as down 2 lbs since admission, however exam c/w gross volume overload - Will continue Lasix 80IV BID  Hypertension: Blood pressures are slightly improved over the last 24 hours at around 100s-130s/70s-90s. At home, patient is on diltiazem 120 mg daily, enalapril 20 mg daily, Lasix reportedly up to 200 mg daily, sotalol 80 mg twice a day.  -Continue to hold home enalapril given low normal blood pressures upon initial evaluation.  -Blood pressure management as above with paroxysmal atrial fibrillation.  Type two diabetes: Patient's blood glucose have been moderately well controlled over the last 24 hours. Home regimen is lantus 60 QHS and novolog 30 unit TIDAC. He was on 15 u of Novolog TIDWC and Lantus 45 units however was Hypoglycemic this morning at 67. -Decrease lantus to 40 units daily -Continue NovoLog 15 3 times a day before meals  -Moderate sliding scale insulin with nighttime correction   Gout: Patient states that he's having a current acute gout flare in his left ankle. Patient currently on colchicine 0.6 mg 4 times a day. It is noted that there is a interaction with diltiazem. -Continue home colchicine at a lower dose of 0.6 mg daily.  Iron deficiency anemia: Patient is on 325 mg ferrous sulfate daily.  -Continue home ferrous sulfate.   Neuropathic pain: Patient is on gabapentin 600 mg twice a day.  -Continue home gabapentin   Diet: Carbohydrate modified  Prophylaxis: Patient currently on  Coumadin and therapeutic  Code status: DNR/DNI, POA is son, Trequan Marsolek cell [phone])  Dispo: Disposition is deferred at this time, awaiting improvement of current medical problems. Anticipated discharge in approximately 2 day(s).   The patient does have a current PCP Zorita Pang, MD) and does need an [facility] hospital follow-up appointment after discharge.  The patient does not have transportation limitations that hinder transportation to clinic appointments.    LOS: 3 days   Services Needed at time of discharge: Y = Yes, Blank = No PT:   OT:   RN:   Equipment:   Other:    Lucious Groves, DO 09/06/2014, 10:47 AM

## 2014-09-06 NOTE — Progress Notes (Signed)
[place] for warfarin Indication: atrial fibrillation  Allergies  Allergen Reactions  . Elavil [Amitriptyline] Other (See Comments)    Passes out    Patient Measurements: Height: 5\' 11"  (180.3 cm) Weight: 289 lb 11 oz (131.4 kg) IBW/kg (Calculated) : 75.3  Vital Signs: Temp: 98.1 F (36.7 C) (03/19 0524) Temp Source: Oral (03/19 0524) BP: 113/64 mmHg (03/19 0524) Pulse Rate: 94 (03/19 0524)  Labs:  Recent Labs  08/29/2014 1846 09/14/2014 2131 09/04/14 0834 09/05/14 0420 09/06/14 0441  HGB  --   --  12.5* 11.6*  --   HCT  --   --  39.0 36.9*  --   PLT  --   --  230 257  --   LABPROT  --   --  23.9* 25.7* 30.3*  INR  --   --  2.11* 2.33* 2.87*  CREATININE  --   --  0.73 0.63 0.67  TROPONINI <0.03 0.03  --   --   --     Estimated Creatinine Clearance: 111.9 mL/min (by C-G formula based on Cr of 0.67).    Assessment: 36 yom presented to the ED with CP and SOB. He is chronically anticoagulated for hx of afib and also had a DVT November 2015. To continue coumadin while admitted. INR remains therapeutic at 2.33. Last dose pta was on 3/13 per MD instructions at appt on Monday 3/14.   No bleeding noted. Also started on azithromycin which may increase the INR. Previous home dose was  2.5 mg TTSat and 5 mg all other days.  I interviewed pt and son on 3/17: his INR was 7.1 on Monday and he was told not to take his coumadin.  His son told him it was OK to take the coumadin on 3/16, but per the RN, the pt refused it. Son and patient think he took 5 mg dose on 3/16 but RN reports that student nurse stated he refused 3/16 dose. He took 5 mg on 3/17.  INR remains therapeutic at 2.87 today.  No bleeding reported.   Goal of Therapy:  INR 2-3   Plan:  -continue home dose of 5 mg daily x 2.5 mg TTsat  - Daily INR  Eudelia Bunch, Pharm.D. [phone] 09/06/2014 1:43 PM

## 2014-09-06 NOTE — Progress Notes (Signed)
VASCULAR LAB PRELIMINARY  PRELIMINARY  PRELIMINARY  PRELIMINARY  Bilateral lower extremity venous Dopplers completed.    Preliminary report:  There is subacute DVT noted in the right common femoral, proximal femoral, and left common femoral veins.  All other veins appear thrombus free.   Phillip Meyer, RVT 09/06/2014, 12:34 PM

## 2014-09-07 ENCOUNTER — Inpatient Hospital Stay (HOSPITAL_COMMUNITY): Payer: Medicare Other

## 2014-09-07 LAB — CBC
HCT: 38.4 % — ABNORMAL LOW (ref 39.0–52.0)
Hemoglobin: 12.3 g/dL — ABNORMAL LOW (ref 13.0–17.0)
MCH: 30.6 pg (ref 26.0–34.0)
MCHC: 32 g/dL (ref 30.0–36.0)
MCV: 95.5 fL (ref 78.0–100.0)
PLATELETS: 231 10*3/uL (ref 150–400)
RBC: 4.02 MIL/uL — AB (ref 4.22–5.81)
RDW: 14 % (ref 11.5–15.5)
WBC: 14.7 10*3/uL — AB (ref 4.0–10.5)

## 2014-09-07 LAB — BASIC METABOLIC PANEL
ANION GAP: 10 (ref 5–15)
BUN: 16 mg/dL (ref 6–23)
CO2: 37 mmol/L — AB (ref 19–32)
Calcium: 8.5 mg/dL (ref 8.4–10.5)
Chloride: 90 mmol/L — ABNORMAL LOW (ref 96–112)
Creatinine, Ser: 0.65 mg/dL (ref 0.50–1.35)
GFR calc Af Amer: >90 mL/min (ref >90)
GFR calc non Af Amer: >90 mL/min (ref >90)
Glucose, Bld: 130 mg/dL — ABNORMAL HIGH (ref 70–99)
POTASSIUM: 3.6 mmol/L (ref 3.5–5.1)
Sodium: 137 mmol/L (ref 135–145)

## 2014-09-07 LAB — BLOOD GAS, ARTERIAL
Acid-Base Excess: 13.4 mmol/L — ABNORMAL HIGH (ref 0.0–2.0)
Bicarbonate: 38.6 mEq/L — ABNORMAL HIGH (ref 20.0–24.0)
Drawn by: 319961
O2 CONTENT: 5 L/min
O2 SAT: 93.6 %
PH ART: 7.422 (ref 7.350–7.450)
Patient temperature: 98.6
TCO2: 40.4 mmol/L (ref 0–100)
pCO2 arterial: 60.3 mmHg (ref 35.0–45.0)
pO2, Arterial: 69 mmHg — ABNORMAL LOW (ref 80.0–100.0)

## 2014-09-07 LAB — GLUCOSE, CAPILLARY
GLUCOSE-CAPILLARY: 121 mg/dL — AB (ref 70–99)
GLUCOSE-CAPILLARY: 152 mg/dL — AB (ref 70–99)
Glucose-Capillary: 164 mg/dL — ABNORMAL HIGH (ref 70–99)
Glucose-Capillary: 204 mg/dL — ABNORMAL HIGH (ref 70–99)
Glucose-Capillary: 87 mg/dL (ref 70–99)

## 2014-09-07 LAB — PROTIME-INR
INR: 3.01 — AB (ref 0.00–1.49)
Prothrombin Time: 31.5 seconds — ABNORMAL HIGH (ref 11.6–15.2)

## 2014-09-07 MED ORDER — DILTIAZEM HCL 60 MG PO TABS: 60.0000 mg | ORAL_TABLET | Freq: Two times a day (BID) | ORAL | Status: DC

## 2014-09-07 MED ORDER — FUROSEMIDE 80 MG PO TABS
80.0000 mg | ORAL_TABLET | Freq: Every day | ORAL | Status: DC
  Administered 2014-09-07 – 2014-09-08 (×2): 80 mg via ORAL
  Filled 2014-09-07 (×2): qty 1

## 2014-09-07 MED ORDER — MORPHINE SULFATE 2 MG/ML IJ SOLN
1.0000 mg | INTRAMUSCULAR | Status: DC | PRN
  Administered 2014-09-07: 1 mg via INTRAVENOUS
  Administered 2014-09-07 – 2014-09-11 (×7): 2 mg via INTRAVENOUS
  Filled 2014-09-07 (×10): qty 1

## 2014-09-07 MED ORDER — FUROSEMIDE 80 MG PO TABS
120.0000 mg | ORAL_TABLET | Freq: Every day | ORAL | Status: DC
  Administered 2014-09-07: 120 mg via ORAL
  Filled 2014-09-07 (×2): qty 1

## 2014-09-07 MED ORDER — INSULIN ASPART 100 UNIT/ML ~~LOC~~ SOLN
10.0000 [IU] | Freq: Three times a day (TID) | SUBCUTANEOUS | Status: DC
  Administered 2014-09-07 – 2014-09-15 (×19): 10 [IU] via SUBCUTANEOUS

## 2014-09-07 MED ORDER — DILTIAZEM HCL 60 MG PO TABS: 30.0000 mg | ORAL_TABLET | Freq: Two times a day (BID) | ORAL | Status: DC

## 2014-09-07 MED ORDER — SALINE SPRAY 0.65 % NA SOLN
1.0000 | NASAL | Status: DC | PRN
  Filled 2014-09-07: qty 44

## 2014-09-07 MED ORDER — WARFARIN SODIUM 5 MG PO TABS
2.5000 mg | ORAL_TABLET | Freq: Once | ORAL | Status: AC
  Administered 2014-09-07: 2.5 mg via ORAL
  Filled 2014-09-07: qty 0.5

## 2014-09-07 MED ORDER — INSULIN GLARGINE 100 UNIT/ML ~~LOC~~ SOLN
30.0000 [IU] | Freq: Every day | SUBCUTANEOUS | Status: DC
  Administered 2014-09-07 – 2014-09-15 (×9): 30 [IU] via SUBCUTANEOUS
  Filled 2014-09-07 (×10): qty 0.3

## 2014-09-07 MED ORDER — DILTIAZEM HCL 60 MG PO TABS
30.0000 mg | ORAL_TABLET | Freq: Four times a day (QID) | ORAL | Status: DC
  Administered 2014-09-07 – 2014-09-16 (×33): 30 mg via ORAL
  Filled 2014-09-07 (×33): qty 1

## 2014-09-07 NOTE — Progress Notes (Addendum)
Patient ID: Phillip Meyer, male   DOB: [birth year], 74 y.o.   MRN: [id number]     Subjective:    Still with some SOB   Objective:   Temp:  [98.1 F (36.7 C)-98.4 F (36.9 C)] 98.1 F (36.7 C) (03/20 0528) Pulse Rate:  [95-102] 100 (03/20 0528) Resp:  [20-21] 21 (03/20 0528) BP: (108-118)/(62-66) 108/62 mmHg (03/20 0528) SpO2:  [93 %-96 %] 93 % (03/20 0528) Weight:  [286 lb 13.1 oz (130.1 kg)] 286 lb 13.1 oz (130.1 kg) (03/20 0528) Last BM Date: 09/06/14  Filed Weights   09/04/14 0500 09/06/14 0524 09/07/14 0528  Weight: 292 lb 8.8 oz (132.7 kg) 289 lb 11 oz (131.4 kg) 286 lb 13.1 oz (130.1 kg)    Intake/Output Summary (Last 24 hours) at 09/07/14 0748 Last data filed at 09/07/14 0738  Gross per 24 hour  Intake    940 ml  Output   4550 ml  Net  -3610 ml    Telemetry: afib, rates 70-90s  Exam:  General: NAD  Resp: crackles bilateral basea  Cardiac: irreg, rate 80  GI: abdomen soft, NT, ND  MSK: 2+ bilateral LE edema  Neuro: no focal deficits   Lab Results:  Basic Metabolic Panel:  Recent Labs Lab 09/11/2014 1029  09/05/14 0420 09/06/14 0441 09/07/14 0429  NA 137  < > 135 136 137  K 3.7  < > 3.9 4.4 3.6  CL 98  < > 95* 93* 90*  CO2 30  < > 33* 38* 37*  GLUCOSE 155*  < > 160* 93 130*  BUN 18  < > 12 16 16   CREATININE 0.90  < > 0.63 0.67 0.65  CALCIUM 8.4  < > 8.2* 8.4 8.5  MG 2.2  --   --   --   --   < > = values in this interval not displayed.  Liver Function Tests:  Recent Labs Lab 09/15/2014 1029 09/04/14 0834  AST 42* 30  ALT 15 13  ALKPHOS 101 93  BILITOT 1.4* 1.2  PROT 6.4 6.1  ALBUMIN 2.7* 2.3*    CBC:  Recent Labs Lab 09/04/14 0834 09/05/14 0420 09/07/14 0429  WBC 18.7* 16.3* 14.7*  HGB 12.5* 11.6* 12.3*  HCT 39.0 36.9* 38.4*  MCV 96.3 96.3 95.5  PLT 230 257 231    Cardiac Enzymes:  Recent Labs Lab 09/07/2014 1029 09/06/2014 1846 08/29/2014 2131  TROPONINI 0.03 <0.03 0.03    BNP: No results for input(s): PROBNP  in the last 8760 hours.  Coagulation:  Recent Labs Lab 09/05/14 0420 09/06/14 0441 09/07/14 0429  INR 2.33* 2.87* 3.01*    ECG:   Medications:   Scheduled Medications: . azithromycin  500 mg Oral Q24H  . cefTRIAXone (ROCEPHIN)  IV  1 g Intravenous Q24H  . colchicine  0.6 mg Oral Daily  . dextromethorphan-guaiFENesin  1 tablet Oral BID  . diltiazem  30 mg Oral Q12H  . docusate sodium  200 mg Oral Daily  . ferrous sulfate  325 mg Oral Q breakfast  . furosemide  80 mg Intravenous BID  . gabapentin  600 mg Oral BID  . insulin aspart  0-15 Units Subcutaneous TID WC  . insulin aspart  0-5 Units Subcutaneous QHS  . insulin aspart  15 Units Subcutaneous TID WC  . insulin glargine  40 Units Subcutaneous QHS  . metoCLOPramide  5 mg Oral TID AC  . metoprolol tartrate  25 mg Oral BID  . multivitamin with minerals  1 tablet Oral Daily  . rOPINIRole  2 mg Oral BID  . simvastatin  10 mg Oral QHS  . sodium chloride  3 mL Intravenous Q12H  . sotalol  80 mg Oral BID  . warfarin  5 mg Oral Once per day on Sun Mon Wed Fri   And  . warfarin  2.5 mg Oral Once per day on Tue Thu Sat  . Warfarin - Pharmacist Dosing Inpatient   Does not apply q1800     Infusions:     PRN Medications:  ipratropium-albuterol, morphine injection, sodium chloride, traMADol     Assessment/Plan    1. Chronic combined systolic/diastolic HF - echo 0/1655 [facility] LVEF 40-45%, indeterminate diastolic function but evidence of elevated LA pressures, borderline reduced RV function. - CXR with pneumonia, mild congestion thought likely to be reactive. BNP 270. Wt outpatient appt Jan 2016 130 lbs, admit weight 132 lbs.  - he has been on IV lasix 80mg  bid, negative 3.1 liters yesterday and negative  8.2 liters since admission. Cr and BUN trending down consistent with venous congestion. Still with severe bilateral LE edema, some crackles on lung exam - continue IV lasix today  2. Pneumonia - per primary  team  3. Afib - on oral dilt 30mg  bid, metoprolol 25mg  bid, sotalol 80mg  bid, and coumadin -Some question from prior notes about titrating up dilt vs metoprolol for better rate control given his mild systolic dysfuction. From primary cardiologists notes appears he he was on dilt long acting 120mg  daily, coumadin, and sotalol 80mg  bid. Will continue with there strategy and titrate up his diltiazem, with only mild LV systolic dysfunction this is reasonable option.. Defer to primary if would consider changing to beta blocker at some point in follow up.  - consider changing to long acting dilt tomorrow.        Carlyle Dolly, M.D.

## 2014-09-07 NOTE — Progress Notes (Signed)
ABG returned with a metabolic alkalosis and respiratory acidosis. Some of this could be secondary to contraction alkalosis, which could lead to suppressed respiratory drive. There is also possible contribution from a primary pulmonary process. Will resume home dosage of Lasix and discontinue Lasix 80 mg IV twice a day. Will continue to monitor.

## 2014-09-07 NOTE — Progress Notes (Signed)
ABG resulted, MD paged. Graceann Congress

## 2014-09-07 NOTE — Progress Notes (Addendum)
Subjective:   Patient states that he has some nasal congestion preventing him from breathing through his nose this morning. Otherwise, patient denies any significant cough. He states that his legs do appear more swollen than usual, especially on his right.  Objective: Vital signs in last 24 hours: Filed Vitals:   09/06/14 0524 09/06/14 1342 09/06/14 2124 09/07/14 0528  BP: 113/64 118/64 115/66 108/62  Pulse: 94 95 102 100  Temp: 98.1 F (36.7 C) 98.4 F (36.9 C) 98.2 F (36.8 C) 98.1 F (36.7 C)  TempSrc: Oral Oral Oral Oral  Resp: 22 20 20 21   Height:      Weight: 289 lb 11 oz (131.4 kg)   286 lb 13.1 oz (130.1 kg)  SpO2: 94% 96% 94% 93%   Weight change: -2 lb 13.9 oz (-1.3 kg)  Intake/Output Summary (Last 24 hours) at 09/07/14 0747 Last data filed at 09/07/14 0738  Gross per 24 hour  Intake    940 ml  Output   4550 ml  Net  -3610 ml    General: resting in bed, nasal cannula in place, currently on 5 L nasal cannula Cardiac: Irregularly irregular, tachycardic, no rubs, murmurs or gallops Pulm: Bibasilar rales and diffuse rhonchi, moving normal volumes of air Abd: soft, nontender, nondistended, BS present Ext: warm and well perfused, 2+ bilateral lower extremity edema, with right leg circumference greater than left Neuro: alert and oriented X3, cranial nerves II-XII grossly intact Skin: no rashes or lesions noted Psych: appropriate affect and cognition  Lab Results: Basic Metabolic Panel:  Recent Labs Lab 08/27/2014 1029  09/06/14 0441 09/07/14 0429  NA 137  < > 136 137  K 3.7  < > 4.4 3.6  CL 98  < > 93* 90*  CO2 30  < > 38* 37*  GLUCOSE 155*  < > 93 130*  BUN 18  < > 16 16  CREATININE 0.90  < > 0.67 0.65  CALCIUM 8.4  < > 8.4 8.5  MG 2.2  --   --   --   < > = values in this interval not displayed. Liver Function Tests:  Recent Labs Lab 08/23/2014 1029 09/04/14 0834  AST 42* 30  ALT 15 13  ALKPHOS 101 93  BILITOT 1.4* 1.2  PROT 6.4 6.1  ALBUMIN  2.7* 2.3*   No results for input(s): LIPASE, AMYLASE in the last 168 hours. No results for input(s): AMMONIA in the last 168 hours. CBC:  Recent Labs Lab 09/05/14 0420 09/07/14 0429  WBC 16.3* 14.7*  HGB 11.6* 12.3*  HCT 36.9* 38.4*  MCV 96.3 95.5  PLT 257 231   Cardiac Enzymes:  Recent Labs Lab 09/15/2014 1029 09/02/2014 1846 08/19/2014 2131  TROPONINI 0.03 <0.03 0.03   BNP: No results for input(s): PROBNP in the last 168 hours. D-Dimer: No results for input(s): DDIMER in the last 168 hours. CBG:  Recent Labs Lab 09/06/14 1137 09/06/14 1648 09/06/14 2131 09/06/14 2207 09/06/14 2339 09/07/14 0018  GLUCAP 190* 151* 46* 99 148* 164*   Coagulation:  Recent Labs Lab 09/04/14 0834 09/05/14 0420 09/06/14 0441 09/07/14 0429  LABPROT 23.9* 25.7* 30.3* 31.5*  INR 2.11* 2.33* 2.87* 3.01*    Micro Results: Recent Results (from the past 240 hour(s))  Culture, blood (routine x 2) Call MD if unable to obtain prior to antibiotics being given     Status: None (Preliminary result)   Collection Time: 08/28/2014  6:46 PM  Result Value Ref Range Status  Specimen Description BLOOD RIGHT ANTECUBITAL  Final   Special Requests BOTTLES DRAWN AEROBIC AND ANAEROBIC 5CC  Final   Culture   Final           BLOOD CULTURE RECEIVED NO GROWTH TO DATE CULTURE WILL BE HELD FOR 5 DAYS BEFORE ISSUING A FINAL NEGATIVE REPORT Performed at [facility]    Report Status PENDING  Incomplete  Culture, blood (routine x 2) Call MD if unable to obtain prior to antibiotics being given     Status: None (Preliminary result)   Collection Time: 09/10/2014  6:57 PM  Result Value Ref Range Status   Specimen Description BLOOD RIGHT HAND  Final   Special Requests BOTTLES DRAWN AEROBIC ONLY 3CC  Final   Culture   Final           BLOOD CULTURE RECEIVED NO GROWTH TO DATE CULTURE WILL BE HELD FOR 5 DAYS BEFORE ISSUING A FINAL NEGATIVE REPORT Performed at [facility]    Report Status PENDING   Incomplete   Studies/Results: No results found. Medications: I have reviewed the patient's current medications. Scheduled Meds: . azithromycin  500 mg Oral Q24H  . cefTRIAXone (ROCEPHIN)  IV  1 g Intravenous Q24H  . colchicine  0.6 mg Oral Daily  . dextromethorphan-guaiFENesin  1 tablet Oral BID  . diltiazem  30 mg Oral Q12H  . docusate sodium  200 mg Oral Daily  . ferrous sulfate  325 mg Oral Q breakfast  . furosemide  80 mg Intravenous BID  . gabapentin  600 mg Oral BID  . insulin aspart  0-15 Units Subcutaneous TID WC  . insulin aspart  0-5 Units Subcutaneous QHS  . insulin aspart  15 Units Subcutaneous TID WC  . insulin glargine  40 Units Subcutaneous QHS  . metoCLOPramide  5 mg Oral TID AC  . metoprolol tartrate  25 mg Oral BID  . multivitamin with minerals  1 tablet Oral Daily  . rOPINIRole  2 mg Oral BID  . simvastatin  10 mg Oral QHS  . sodium chloride  3 mL Intravenous Q12H  . sotalol  80 mg Oral BID  . warfarin  5 mg Oral Once per day on Sun Mon Wed Fri   And  . warfarin  2.5 mg Oral Once per day on Tue Thu Sat  . Warfarin - Pharmacist Dosing Inpatient   Does not apply q1800   Continuous Infusions:  PRN Meds:.ipratropium-albuterol, morphine injection, sodium chloride, traMADol Assessment/Plan: Active Problems:   IDDM (insulin dependent diabetes mellitus)   CAP (community acquired pneumonia)   HLD (hyperlipidemia)   BP (high blood pressure)   Gout of ankle   Atrial fibrillation  Patient is a 74 year old male with a history of type 2 diabetes, hypertension, congestive heart failure, hyperlipidemia, gout admitted for community-acquired pneumonia now hospital day 5 with persistent oxygen requirements in the setting of pulmonary edema and subacute DVT.  Community Acquired Pneumonia: Patient with a persistent oxygen requirement at 5 L nasal cannula. Therapy suboptimal given severe nasal congestion this morning. Patient remains afebrile. White count continues to trend  down to 14.7 from 16.3 yesterday. Blood cultures from 08/30/2014 remain negative to date. Sputum cultures not collected. -Continue day 4/5 of ceftriaxone and azithromycin (planned end date 09/08/14 - please note change from previous notes) -Continue home tramadol for chest and abdominal discomfort. -Mucinex for cough -Nasal saline spray for nasal congestion. -Incentive spirometry, flutter valve, and DuoNeb when necessary -Repeat chest x-ray to  assess for other etiologies for persistent oxygen requirement such as persistent pulmonary edema.  Right lower extremity subacute DVT: Subacute DVT from ultrasound yesterday. Patient has remained therapeutic on Coumadin throughout this admission. Likelihood of a new acute thrombotic event is less likely given this. -Continue therapy on Coumadin.   Paroxysmal atrial fibrillation:  patient has remained in atrial fibrillation throughout this admission. Patient has remained in the low 90s to 100s pulse rate. There is some concern in having patient can currently on metoprolol and sotalol. Patient will need a cardioversion likely back at [facility] once there is resolution of the pneumonia. -Appreciate cardiology recommendations -Titrate diltiazem 30 mg from every 6 hours to every 12 hours. -Metoprolol has been titrated up to 25 mg twice a day -Continue home sotalol to help with sinus rhythm once reestablished.  -Continue with anticoagulation with Coumadin.   Systolic congestive heart failure:  patient is net -3.1 L over the last 24 hours with a net of -7.7 L over this hospitalization. Patient still with persistent signs of fluid overload with 2+ pitting edema in bilateral lower extremities as well as bibasilar Rales on lung exam. -Continue Lasix 80 mg IV twice a day   Hypertension:  patient has been able to sustain satisfactory blood pressures in the low 841Y systolic over the last 24 hours. At home, patient is on diltiazem 120 mg daily, enalapril 20 mg daily,  Lasix reportedly up to 200 mg daily, sotalol 80 mg twice a day.  -Continue to hold home enalapril given low normal blood pressures  -Blood pressure management as above with paroxysmal atrial fibrillation.  Type two diabetes:  patient with one episode of hypoglycemia at 46 last night. Otherwise, patient has had satisfactory blood glucose control.  -Decrease lantus to 40 units daily -Continue NovoLog 15 3 times a day before meals  -Moderate sliding scale insulin with nighttime correction   Gout:  Patient with stable pain in his left ankle. Patient currently on colchicine 0.6 mg 4 times a day. It is noted that there is a interaction with diltiazem. -Continue home colchicine at a lower dose of 0.6 mg daily.  Iron deficiency anemia: Patient is on 325 mg ferrous sulfate daily.  -Continue home ferrous sulfate.   Neuropathic pain: Patient is on gabapentin 600 mg twice a day.  -Continue home gabapentin   Diet: Carbohydrate modified  Prophylaxis: Patient currently on Coumadin and therapeutic  Code status: DNR/DNI, POA is son, Jakyron Fabro cell [phone])  Dispo: Disposition is deferred at this time, awaiting improvement of current medical problems. Anticipated discharge in approximately 2 day(s).   The patient does have a current PCP Zorita Pang, MD) and does need an [facility] hospital follow-up appointment after discharge.  The patient does not have transportation limitations that hinder transportation to clinic appointments.    LOS: 4 days   Services Needed at time of discharge: Y = Yes, Blank = No PT:   OT:   RN:   Equipment:   Other:    Luan Moore, MD 09/07/2014, 7:47 AM

## 2014-09-07 NOTE — Progress Notes (Signed)
[place] for warfarin Indication: atrial fibrillation  Allergies  Allergen Reactions  . Elavil [Amitriptyline] Other (See Comments)    Passes out    Patient Measurements: Height: 5\' 11"  (180.3 cm) Weight: 286 lb 13.1 oz (130.1 kg) IBW/kg (Calculated) : 75.3  Vital Signs: Temp: 98.1 F (36.7 C) (03/20 0528) Temp Source: Oral (03/20 0528) BP: 103/77 mmHg (03/20 0807) Pulse Rate: 100 (03/20 0528)  Labs:  Recent Labs  09/04/14 0834 09/05/14 0420 09/06/14 0441 09/07/14 0429  HGB 12.5* 11.6*  --  12.3*  HCT 39.0 36.9*  --  38.4*  PLT 230 257  --  231  LABPROT 23.9* 25.7* 30.3* 31.5*  INR 2.11* 2.33* 2.87* 3.01*  CREATININE 0.73 0.63 0.67 0.65    Estimated Creatinine Clearance: 111.4 mL/min (by C-G formula based on Cr of 0.65).    Assessment: 34 yom presented to the ED with CP and SOB. He is chronically anticoagulated for hx of afib and also had a DVT November 2015. To continue coumadin while admitted. INR remains therapeutic at 3.01. Last dose pta was on 3/13 per MD instructions at appt on Monday 3/14.   No bleeding noted. Also on azithromycin which may increase the INR. Previous home dose was  2.5 mg TTSat and 5 mg all other days.  3/19 subacute R DVT noted on dopplers. Had DVT November 2015.  I interviewed pt and son on 3/17: his INR was 7.1 on Monday 3/14 and he was told not to take his coumadin.  His son told him it was OK to take the coumadin on 3/16, but per the RN, the pt refused it. Son and patient think he took 5 mg dose on 3/16 but RN reports that student nurse stated he refused 3/16 dose. He took 5 mg on 3/17.  INR remains therapeutic at 3.01 today.  No bleeding reported. CBC stable.    Goal of Therapy:  INR 2-3   Plan:  -DC home dose of 5 mg daily x 2.5 mg TTsat and give 2.5 mg again today - Daily INR  Eudelia Bunch, Pharm.D. [phone] 09/07/2014 8:20 AM

## 2014-09-07 NOTE — Progress Notes (Signed)
Subjective:  Patient states that he feels like he is feeling a little better this morning. His nasal congestion is persistent. He is otherwise not reporting any complaints this morning.   Objective: Vital signs in last 24 hours: Filed Vitals:   09/07/14 1328 09/07/14 1432 09/07/14 2137 09/08/14 0500  BP: 113/67 113/72 117/60 101/60  Pulse:  80 92 96  Temp:  98 F (36.7 C) 98.2 F (36.8 C) 97.6 F (36.4 C)  TempSrc:  Oral Oral Oral  Resp:  20 20 20   Height:      Weight:    289 lb 7.4 oz (131.3 kg)  SpO2:  95% 95% 97%   Weight change: 2 lb 10.3 oz (1.2 kg)  Intake/Output Summary (Last 24 hours) at 09/08/14 1008 Last data filed at 09/07/14 2330  Gross per 24 hour  Intake      0 ml  Output   1500 ml  Net  -1500 ml    General: resting in chair, eating breakfast, nasal cannula in place, currently on 5 L nasal cannula Cardiac: Irregularly irregular, tachycardic, no rubs, murmurs or gallops Pulm: Bibasilar rales and diffuse rhonchi, moving normal volumes of air Abd: soft, nontender, nondistended, BS present Ext: warm and well perfused, 2+ bilateral lower extremity edema, with right leg circumference greater than left Neuro: alert and oriented X3, cranial nerves II-XII grossly intact Skin: no rashes or lesions noted Psych: appropriate affect and cognition  Lab Results: Basic Metabolic Panel:  Recent Labs Lab 09/05/2014 1029  09/07/14 0429 09/08/14 0530  NA 137  < > 137 137  K 3.7  < > 3.6 4.0  CL 98  < > 90* 87*  CO2 30  < > 37* 39*  GLUCOSE 155*  < > 130* 98  BUN 18  < > 16 16  CREATININE 0.90  < > 0.65 0.73  CALCIUM 8.4  < > 8.5 9.0  MG 2.2  --   --   --   < > = values in this interval not displayed. Liver Function Tests:  Recent Labs Lab 09/18/2014 1029 09/04/14 0834  AST 42* 30  ALT 15 13  ALKPHOS 101 93  BILITOT 1.4* 1.2  PROT 6.4 6.1  ALBUMIN 2.7* 2.3*   No results for input(s): LIPASE, AMYLASE in the last 168 hours. No results for input(s): AMMONIA  in the last 168 hours. CBC:  Recent Labs Lab 09/07/14 0429 09/08/14 0530  WBC 14.7* 15.8*  HGB 12.3* 13.1  HCT 38.4* 41.3  MCV 95.5 97.2  PLT 231 197   Cardiac Enzymes:  Recent Labs Lab 09/05/2014 1029 09/07/2014 1846 09/12/2014 2131  TROPONINI 0.03 <0.03 0.03   BNP: No results for input(s): PROBNP in the last 168 hours. D-Dimer: No results for input(s): DDIMER in the last 168 hours. CBG:  Recent Labs Lab 09/07/14 0018 09/07/14 0746 09/07/14 1210 09/07/14 1655 09/07/14 2139 09/08/14 0726  GLUCAP 164* 87 204* 121* 152* 87   Coagulation:  Recent Labs Lab 09/05/14 0420 09/06/14 0441 09/07/14 0429 09/08/14 0530  LABPROT 25.7* 30.3* 31.5* 35.7*  INR 2.33* 2.87* 3.01* 3.54*    Micro Results: Recent Results (from the past 240 hour(s))  Culture, blood (routine x 2) Call MD if unable to obtain prior to antibiotics being given     Status: None (Preliminary result)   Collection Time: 08/30/2014  6:46 PM  Result Value Ref Range Status   Specimen Description BLOOD RIGHT ANTECUBITAL  Final   Special Requests BOTTLES DRAWN AEROBIC  AND ANAEROBIC 5CC  Final   Culture   Final           BLOOD CULTURE RECEIVED NO GROWTH TO DATE CULTURE WILL BE HELD FOR 5 DAYS BEFORE ISSUING A FINAL NEGATIVE REPORT Performed at [facility]    Report Status PENDING  Incomplete  Culture, blood (routine x 2) Call MD if unable to obtain prior to antibiotics being given     Status: None (Preliminary result)   Collection Time: 09/14/2014  6:57 PM  Result Value Ref Range Status   Specimen Description BLOOD RIGHT HAND  Final   Special Requests BOTTLES DRAWN AEROBIC ONLY 3CC  Final   Culture   Final           BLOOD CULTURE RECEIVED NO GROWTH TO DATE CULTURE WILL BE HELD FOR 5 DAYS BEFORE ISSUING A FINAL NEGATIVE REPORT Performed at [facility]    Report Status PENDING  Incomplete   Studies/Results: Dg Chest 2 View  09/07/2014   CLINICAL DATA:  Dyspnea.  Recent diagnosis of  pneumonia.  EXAM: CHEST  2 VIEW  COMPARISON:  09/13/2014.  FINDINGS: There is persistent opacity in the lung bases, greater on the left, now mostly obscuring the left heart border and left hemidiaphragm on the frontal view. On the lateral view, left lung base opacity projects in the left upper lobe lingula and left lower lobe. Mild streaky opacity in the right lung base is stable.  No other areas of lung consolidation. No pulmonary edema. There is no convincing pleural effusion. No pneumothorax.  Heart is mildly enlarged.  IMPRESSION: 1. Slight changes in the appearance of the lung base opacity on the left is likely due to larger lung volumes on the current exam and differences in radiographic technique and positioning. Overall, there has been no convincing change from the previous study with persistent left upper lobe lingular and left lower lobe consolidation and milder opacity at the right lung base. Right basilar opacity may all be atelectasis or additional infiltrate or a combination.   Electronically Signed   By: Lajean Manes M.D.   On: 09/07/2014 09:33   Medications: I have reviewed the patient's current medications. Scheduled Meds: . azithromycin  500 mg Oral Q24H  . cefTRIAXone (ROCEPHIN)  IV  1 g Intravenous Q24H  . colchicine  0.6 mg Oral Daily  . dextromethorphan-guaiFENesin  1 tablet Oral BID  . diltiazem  30 mg Oral 4 times per day  . docusate sodium  200 mg Oral Daily  . ferrous sulfate  325 mg Oral Q breakfast  . furosemide  120 mg Oral q1800  . furosemide  80 mg Oral QAC breakfast  . gabapentin  600 mg Oral BID  . insulin aspart  0-15 Units Subcutaneous TID WC  . insulin aspart  0-5 Units Subcutaneous QHS  . insulin aspart  10 Units Subcutaneous TID WC  . insulin glargine  30 Units Subcutaneous QHS  . metoCLOPramide  5 mg Oral TID AC  . multivitamin with minerals  1 tablet Oral Daily  . rOPINIRole  2 mg Oral BID  . simvastatin  10 mg Oral QHS  . sodium chloride  3 mL  Intravenous Q12H  . sotalol  80 mg Oral BID  . Warfarin - Pharmacist Dosing Inpatient   Does not apply q1800   Continuous Infusions:  PRN Meds:.ipratropium-albuterol, morphine injection, sodium chloride, traMADol Assessment/Plan: Active Problems:   IDDM (insulin dependent diabetes mellitus)   CAP (community acquired pneumonia)  HLD (hyperlipidemia)   BP (high blood pressure)   Gout of ankle   Atrial fibrillation  Patient is a 74 year old male with a history of type 2 diabetes, hypertension, congestive heart failure, hyperlipidemia, gout admitted for community-acquired pneumonia now hospital day 5 with persistent oxygen requirements in the setting of pulmonary edema and subacute DVT.  Persistent hypoxia: There are several possible etiologies at this point. CAP as noted below, though patient is now on day 5 of treatment. He had improvement of his productive cough though with persistent hypoxia. Also possible that PE could be contributory given positive subacute DVT. However, patient has been therapeutic on coumadin throughout admission and ABG unremarkable for an elevated A-a gradient. Pulmonary edema in the setting of heart failure also possible given rales on exam. However, no major improvement status post diuresis of nearly 10 L during this admission and no strong evidence of pulmonary edema on serial chest x-rays. Would question if a metabolic alkalosis secondary to contraction alkalosis could be decreasing respiratory drive. Additionally, there could be a component of obesity hypoventilation syndrome leading to a concurrent respiratory acidosis.  -CTA chest for further evaluation of PE -Procalcitonin for further clarity on infectious etiology  Community Acquired Pneumonia: Patient with an oxygen requirement of 5L. Patient remains afebrile. White count stable at 15.8. Blood cultures from 09/02/2014 remain negative to date. Sputum cultures not collected. Repeat chest x-ray from yesterday  showing persistent consolidations as expected but no significant pulmonary edema. -Continue day 5/5 of ceftriaxone and azithromycin (planned end date today) -Consider escalation of antibiotics depending on CTA and procalcitonin. -Continue home tramadol for chest and abdominal discomfort. -Mucinex for cough -Nasal saline spray for nasal congestion. -Incentive spirometry, flutter valve, and DuoNeb when necessary  Right lower extremity subacute DVT: Subacute DVT from ultrasound 09/06/14. Patient has remained therapeutic on Coumadin throughout this admission. Likelihood of a new acute thrombotic event is less likely given this. No evidence of elevated A-a gradient on ABG from yesterday to suggest pulmonary embolism. -Continue therapy on Coumadin.   Paroxysmal atrial fibrillation:  Patient has remained in atrial fibrillation throughout this admission. Patient has remained in the 90s. There is some concern in having patient can currently on metoprolol and sotalol. Patient will need a cardioversion likely back at [facility] once there is resolution of the pneumonia. -Appreciate cardiology recommendations -Convert to diltiazem 120 mg daily (home dosage) -Metoprolol has been titrated up to 25 mg twice a day -Continue home sotalol to help with sinus rhythm once reestablished.  -Continue with anticoagulation with Coumadin.   Systolic congestive heart failure:  patient is net -2.3 L over the last 24 hours with a net of -10 L over this hospitalization. Elevated bicarbonate likely indicative of a contraction alkalosis. Patient still with persistent signs of fluid overload with 2+ pitting edema in bilateral lower extremities as well as bibasilar Rales on lung exam. -Resume home Lasix dosage of 120 mg in the morning and 80 mg in the evening.  Hypertension:  Patient has been able to sustain satisfactory blood pressures in the low 211H systolic over the last 24 hours. At home, patient is on diltiazem 120 mg daily,  enalapril 20 mg daily, Lasix reportedly up to 200 mg daily, sotalol 80 mg twice a day.  -Continue to hold home enalapril given low normal blood pressures  -Blood pressure management as above with paroxysmal atrial fibrillation.  Type two diabetes:  Patient with no recurrent episodes of hypoglycemia over the last 24 hours. Otherwise, patient  has had satisfactory blood glucose control.  -Decreased lantus to 30 units daily  -Continue NovoLog 10 3 times a day before meals  -Moderate sliding scale insulin with nighttime correction   Gout:  Patient with stable pain in his left ankle. Patient currently on colchicine 0.6 mg 4 times a day. It is noted that there is a interaction with diltiazem. -Continue home colchicine at a lower dose of 0.6 mg daily.  Iron deficiency anemia: Patient is on 325 mg ferrous sulfate daily.  -Continue home ferrous sulfate.   Neuropathic pain: Patient is on gabapentin 600 mg twice a day.  -Continue home gabapentin   Diet: Carbohydrate modified  Prophylaxis: Patient currently on Coumadin and therapeutic  Code status: DNR/DNI, POA is son, Phillip Meyer cell [phone])  Dispo: Disposition is deferred at this time, awaiting improvement of current medical problems. Anticipated discharge in approximately 2 day(s).   The patient does have a current PCP Zorita Pang, MD) and does need an [facility] hospital follow-up appointment after discharge.  The patient does not have transportation limitations that hinder transportation to clinic appointments.    LOS: 5 days   Services Needed at time of discharge: Y = Yes, Blank = No PT:   OT:   RN:   Equipment:   Other:    Phillip Moore, MD 09/08/2014, 10:08 AM

## 2014-09-08 ENCOUNTER — Inpatient Hospital Stay (HOSPITAL_COMMUNITY): Payer: Medicare Other

## 2014-09-08 ENCOUNTER — Encounter (HOSPITAL_COMMUNITY): Payer: Self-pay

## 2014-09-08 DIAGNOSIS — I998 Other disorder of circulatory system: Secondary | ICD-10-CM

## 2014-09-08 DIAGNOSIS — I509 Heart failure, unspecified: Secondary | ICD-10-CM

## 2014-09-08 DIAGNOSIS — I5043 Acute on chronic combined systolic (congestive) and diastolic (congestive) heart failure: Secondary | ICD-10-CM

## 2014-09-08 DIAGNOSIS — J189 Pneumonia, unspecified organism: Secondary | ICD-10-CM

## 2014-09-08 LAB — CBC
HCT: 41.3 % (ref 39.0–52.0)
Hemoglobin: 13.1 g/dL (ref 13.0–17.0)
MCH: 30.8 pg (ref 26.0–34.0)
MCHC: 31.7 g/dL (ref 30.0–36.0)
MCV: 97.2 fL (ref 78.0–100.0)
PLATELETS: 197 10*3/uL (ref 150–400)
RBC: 4.25 MIL/uL (ref 4.22–5.81)
RDW: 14.2 % (ref 11.5–15.5)
WBC: 15.8 10*3/uL — ABNORMAL HIGH (ref 4.0–10.5)

## 2014-09-08 LAB — BASIC METABOLIC PANEL
Anion gap: 11 (ref 5–15)
BUN: 16 mg/dL (ref 6–23)
CALCIUM: 9 mg/dL (ref 8.4–10.5)
CO2: 39 mmol/L — ABNORMAL HIGH (ref 19–32)
Chloride: 87 mmol/L — ABNORMAL LOW (ref 96–112)
Creatinine, Ser: 0.73 mg/dL (ref 0.50–1.35)
GFR, EST NON AFRICAN AMERICAN: 89 mL/min — AB (ref >90)
Glucose, Bld: 98 mg/dL (ref 70–99)
Potassium: 4 mmol/L (ref 3.5–5.1)
Sodium: 137 mmol/L (ref 135–145)

## 2014-09-08 LAB — GLUCOSE, CAPILLARY
Glucose-Capillary: 87 mg/dL (ref 70–99)
Glucose-Capillary: 178 mg/dL — ABNORMAL HIGH (ref 70–99)
Glucose-Capillary: 114 mg/dL — ABNORMAL HIGH (ref 70–99)
Glucose-Capillary: 206 mg/dL — ABNORMAL HIGH (ref 70–99)

## 2014-09-08 LAB — PROTIME-INR
INR: 3.54 — ABNORMAL HIGH (ref 0.00–1.49)
Prothrombin Time: 35.7 seconds — ABNORMAL HIGH (ref 11.6–15.2)

## 2014-09-08 LAB — PROCALCITONIN: Procalcitonin: 0.41 ng/mL

## 2014-09-08 MED ORDER — DEXTROSE 5 % IV SOLN
2.0000 g | Freq: Two times a day (BID) | INTRAVENOUS | Status: DC
  Administered 2014-09-08 – 2014-09-10 (×4): 2 g via INTRAVENOUS
  Filled 2014-09-08 (×5): qty 2

## 2014-09-08 MED ORDER — IOHEXOL 350 MG/ML SOLN
80.0000 mL | Freq: Once | INTRAVENOUS | Status: AC | PRN
  Administered 2014-09-08: 66 mL via INTRAVENOUS

## 2014-09-08 MED ORDER — VANCOMYCIN HCL 10 G IV SOLR
2000.0000 mg | Freq: Once | INTRAVENOUS | Status: AC
  Administered 2014-09-08: 2000 mg via INTRAVENOUS
  Filled 2014-09-08: qty 2000

## 2014-09-08 MED ORDER — VANCOMYCIN HCL IN DEXTROSE 1-5 GM/200ML-% IV SOLN
1000.0000 mg | Freq: Two times a day (BID) | INTRAVENOUS | Status: DC
  Administered 2014-09-09 – 2014-09-10 (×3): 1000 mg via INTRAVENOUS
  Filled 2014-09-08 (×4): qty 200

## 2014-09-08 MED ORDER — FUROSEMIDE 10 MG/ML IJ SOLN
80.0000 mg | Freq: Three times a day (TID) | INTRAMUSCULAR | Status: DC
  Administered 2014-09-08 (×2): 80 mg via INTRAVENOUS
  Filled 2014-09-08 (×3): qty 8

## 2014-09-08 MED ORDER — HEPARIN (PORCINE) IN NACL 100-0.45 UNIT/ML-% IJ SOLN
1900.0000 [IU]/h | INTRAMUSCULAR | Status: DC
  Administered 2014-09-08: 1600 [IU]/h via INTRAVENOUS
  Administered 2014-09-09 – 2014-09-12 (×6): 1900 [IU]/h via INTRAVENOUS
  Filled 2014-09-08 (×12): qty 250

## 2014-09-08 NOTE — Progress Notes (Addendum)
SHORT PROGRESS NOTE- UPDATE    Subjective:   I evaluated the patient after receiving the results of the CTA of the chest showing pulmonary embolism In addition, pulmonary nodules concerning for malignancy. Patient was also noted earlier to have a cold left lower extremity.  Otherwise, the patient is feeling comfortable without any new symptoms.    Objective:    BP 101/60 mmHg  Pulse 96  Temp(Src) 97.6 F (36.4 C) (Oral)  Resp 20  Ht 5\' 11"  (1.803 m)  Wt 289 lb 7.4 oz (131.3 kg)  BMI 40.39 kg/m2  SpO2 97%   Labs: Basic Metabolic Panel:    Component Value Date/Time   NA 137 09/08/2014 0530   K 4.0 09/08/2014 0530   CL 87* 09/08/2014 0530   CO2 39* 09/08/2014 0530   BUN 16 09/08/2014 0530   CREATININE 0.73 09/08/2014 0530   GLUCOSE 98 09/08/2014 0530   CALCIUM 9.0 09/08/2014 0530    CBC:    Component Value Date/Time   WBC 15.8* 09/08/2014 0530   HGB 13.1 09/08/2014 0530   HCT 41.3 09/08/2014 0530   PLT 197 09/08/2014 0530   MCV 97.2 09/08/2014 0530   NEUTROABS 8.0* 05/12/2014 1705   LYMPHSABS 2.1 05/12/2014 1705   MONOABS 1.0 05/12/2014 1705   EOSABS 0.2 05/12/2014 1705   BASOSABS 0.0 05/12/2014 1705    Cardiac Enzymes: Lab Results  Component Value Date   CKTOTAL 245* 08/28/2009   CKMB 3.8 08/28/2009   TROPONINI 0.03 09/06/2014    Physical Exam: General: Vital signs reviewed and noted. Well-developed, well-nourished, in mild acute distress; alert, appropriate and cooperative throughout examination. Son in room  Lungs:  Normal respiratory effort. Clear to auscultation BL without crackles or wheezes. On supplemental oxygen via nasal cannula.   Heart: RRR. S1 and S2 normal without gallop, murmur, or rubs.  Abdomen:  BS normoactive. Soft, Nondistended, non-tender.  No masses or organomegaly.  Extremities: Bilateral lower extremity pitting edema. I cannot appreciate pulses on both sides but the left leg feels cold.     Assessment/ Plan:    Pulmonary  embolism: CTA revealed pulmonary embolism ?chronicity. No right heart strain. Patient already on Coumadin and his INR is supratherapeutic at 3.54. Discussed with Dr. Beryle Beams who recommended continuing with the current Coumadin. He did not recommend switching the patient to Heparin or Lovenox.  Plan -Continue with Coumadin per pharmacy  Possible left lower extremity ischemia: Patient with new onset of cold left lower extremity , raising a concern of arterial thrombosis.  Plan -Discussed with vascular surgery who recommended ABIs -Patient will be evaluated by vascular.  Possible metastatic malignancy ? Primary: CT of the chest revealed multiple pulmonary nodules. Patient also has liver news concerning for metastatic malignancy. Further evaluation with CT of pelvis and abdomen with contrast will be pursued. Due to patient's exposure to contrast for with chest CTA this afternoon, we will do the abdomen and pelvis with contrast tomorrow.  CAP: Patient is being treated for pneumonia but his improvement has been poor.  He has been on ceftriaxone and azithromycin.  We'll switch him to vancomycin and cefepime.  Discussed with the patient and his son at bedside about the results and the need to pursue further tests. They verbalized understanding and their questions answered.   Jessee Avers, MD  09/08/2014, 3:16 PM

## 2014-09-08 NOTE — Progress Notes (Signed)
Physical Therapy Treatment Patient Details Name: Phillip Meyer MRN: [id number] DOB: 1940/07/24 Today's Date: 09/08/2014    History of Present Illness Adm with pna PMHx- DM, multiple back surgeries with Lt foot drop, CHF, gout, RLE DVT    PT Comments    Patient agreeable to ambulation but became very fatgued with complains of L LE pain. Patient requested to return to bed. MD present to doppler LEs. Patient now with new Bil PEs and nodules on lungs. Will continue to follow for activity tolerance and recommendations.   Follow Up Recommendations  Home health PT;Supervision/Assistance - 24 hour     Equipment Recommendations  None recommended by PT    Recommendations for Other Services       Precautions / Restrictions Precautions Precautions: Fall;Other (comment) Precaution Comments: desaturates with activity Required Braces or Orthoses: Other Brace/Splint Other Brace/Splint: Lt AFO    Mobility  Bed Mobility   Bed Mobility: Sit to Supine       Sit to supine: Min guard   General bed mobility comments: Mingaurd for safety. Increased effort to lift legs onto bed  Transfers Overall transfer level: Needs assistance Equipment used: Rolling walker (2 wheeled)   Sit to Stand: Min guard         General transfer comment: Min guard for safety due to balance and weakness.   Ambulation/Gait Ambulation/Gait assistance: Min guard Ambulation Distance (Feet): 15 Feet (x2) Assistive device: Rolling walker (2 wheeled) Gait Pattern/deviations: Step-through pattern;Decreased stride length;Shuffle;Trunk flexed     General Gait Details: INcreased flexion with ambulation. Very fatigued with ambulation this session within room. Unable to ambulate further. O2 90% on 6L with ambulation.    Stairs            Wheelchair Mobility    Modified Rankin (Stroke Patients Only)       Balance                                    Cognition Arousal/Alertness:  Awake/alert Behavior During Therapy: WFL for tasks assessed/performed Overall Cognitive Status: Within Functional Limits for tasks assessed                      Exercises      General Comments        Pertinent Vitals/Pain Pain Assessment: No/denies pain    Home Living                      Prior Function            PT Goals (current goals can now be found in the care plan section) Progress towards PT goals: Progressing toward goals    Frequency  Min 3X/week    PT Plan Current plan remains appropriate    Co-evaluation             End of Session Equipment Utilized During Treatment: Oxygen Activity Tolerance: Patient tolerated treatment well Patient left: in bed;with call bell/phone within reach;with nursing/sitter in room     Time: 5956-3875 PT Time Calculation (min) (ACUTE ONLY): 26 min (minus 10 min for restroom break)  Charges:  $Gait Training: 8-22 mins                    G Codes:      [name] 09/08/2014, 3:43 PM 09/08/2014 [name] PTA [phone] pager [phone] office

## 2014-09-08 NOTE — Progress Notes (Signed)
ANTICOAGULATION CONSULT NOTE - Follow Up Consult  Pharmacy Consult:  Coumadin Indication: atrial fibrillation  Allergies  Allergen Reactions  . Elavil [Amitriptyline] Other (See Comments)    Passes out    Patient Measurements: Height: 5\' 11"  (180.3 cm) Weight: 289 lb 7.4 oz (131.3 kg) IBW/kg (Calculated) : 75.3  Vital Signs: Temp: 97.6 F (36.4 C) (03/21 0500) Temp Source: Oral (03/21 0500) BP: 101/60 mmHg (03/21 0500) Pulse Rate: 96 (03/21 0500)  Labs:  Recent Labs  09/06/14 0441 09/07/14 0429 09/08/14 0530  HGB  --  12.3* 13.1  HCT  --  38.4* 41.3  PLT  --  231 197  LABPROT 30.3* 31.5* 35.7*  INR 2.87* 3.01* 3.54*  CREATININE 0.67 0.65 0.73    Estimated Creatinine Clearance: 111.9 mL/min (by C-G formula based on Cr of 0.73).     Assessment: 60 YOM on Coumadin PTA for history of Afib and DVT in November of 2015.  He also has a new subacute DVT.  Per discussion with MD, patient is not deemed a Coumadin failure because the new subacute DVT could have occurred PTA when the patient's INR was sub-therapeutic.  INR currently supra-therapeutic; no bleeding reported.   Goal of Therapy:  INR 2-3    Plan:  - Hold Coumadin today - Daily PT / INR - F/U CTA result    Suleyman Ehrman D. Mina Marble, PharmD, BCPS Pager:  [phone] 09/08/2014, 12:59 PM

## 2014-09-08 NOTE — Progress Notes (Signed)
ANTIBIOTIC CONSULT NOTE - INITIAL  Pharmacy Consult:  Vancomycin / Cefepime Indication:  HCAP  Allergies  Allergen Reactions  . Elavil [Amitriptyline] Other (See Comments)    Passes out    Patient Measurements: Height: 5\' 11"  (180.3 cm) Weight: 289 lb 7.4 oz (131.3 kg) IBW/kg (Calculated) : 75.3  Vital Signs: Temp: 97.6 F (36.4 C) (03/21 0500) Temp Source: Oral (03/21 0500) BP: 101/60 mmHg (03/21 0500) Pulse Rate: 96 (03/21 0500) Intake/Output from previous day: 03/20 0701 - 03/21 0700 In: 240 [P.O.:240] Out: 2550 [Urine:2550]  Labs:  Recent Labs  09/06/14 0441 09/07/14 0429 09/08/14 0530  WBC  --  14.7* 15.8*  HGB  --  12.3* 13.1  PLT  --  231 197  CREATININE 0.67 0.65 0.73   Estimated Creatinine Clearance: 111.9 mL/min (by C-G formula based on Cr of 0.73). No results for input(s): VANCOTROUGH, VANCOPEAK, VANCORANDOM, GENTTROUGH, GENTPEAK, GENTRANDOM, TOBRATROUGH, TOBRAPEAK, TOBRARND, AMIKACINPEAK, AMIKACINTROU, AMIKACIN in the last 72 hours.   Microbiology: Recent Results (from the past 720 hour(s))  Culture, blood (routine x 2) Call MD if unable to obtain prior to antibiotics being given     Status: None (Preliminary result)   Collection Time: 09/05/2014  6:46 PM  Result Value Ref Range Status   Specimen Description BLOOD RIGHT ANTECUBITAL  Final   Special Requests BOTTLES DRAWN AEROBIC AND ANAEROBIC 5CC  Final   Culture   Final           BLOOD CULTURE RECEIVED NO GROWTH TO DATE CULTURE WILL BE HELD FOR 5 DAYS BEFORE ISSUING A FINAL NEGATIVE REPORT Performed at [facility]    Report Status PENDING  Incomplete  Culture, blood (routine x 2) Call MD if unable to obtain prior to antibiotics being given     Status: None (Preliminary result)   Collection Time: 08/23/2014  6:57 PM  Result Value Ref Range Status   Specimen Description BLOOD RIGHT HAND  Final   Special Requests BOTTLES DRAWN AEROBIC ONLY 3CC  Final   Culture   Final           BLOOD CULTURE  RECEIVED NO GROWTH TO DATE CULTURE WILL BE HELD FOR 5 DAYS BEFORE ISSUING A FINAL NEGATIVE REPORT Performed at [facility]    Report Status PENDING  Incomplete    Medical History: Past Medical History  Diagnosis Date  . Hypertension   . CHF (congestive heart failure)   . Arthritis   . Atrial fibrillation   . Left foot drop     wears brace  . Hyperlipidemia   . History of snoring     pt had sleep study but does not have sleep apnea  . Atrial fibrillation   . Pneumonia 09/01/2014  . 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  Patient's renal function has been stable.  CTX 3/16 >> 3/21 Azith 3/16 >> 3/21 Vanc 3/21 >> Cefepime 3/21 >>  3/16 BCx x2 - NGTD   Goal of Therapy:  Vancomycin trough level 15-20 mcg/ml  Full anticoagulation   Plan:  - Vanc 2gm IV x 1, then 1gm IV Q12H - Cefepime 2gm IV Q12H - Monitor renal fxn, clinical progress, vanc trough at Css - Hold Coumadin today - Daily PT / INR - F/U [facility] plan given possible Coumadin failure    Jarel Cuadra D.  Mina Marble, PharmD, BCPS Pager:  [phone] 09/08/2014, 3:01 PM

## 2014-09-08 NOTE — Clinical Documentation Improvement (Signed)
Please provide specificity regarding pulmonary embolism: Please note if POA 1. Acuity: ? Acute ? Acute on chronic ? History of/healed without prophylaxis 2. Type: ? Air embolism ? Amniotic fluid embolism ? Fat embolism ? Saddle embolism 3. Associated condition: ? With acute cor pulmonale ? Without acute cor pulmonale ? Other (please specify) ____________  Phillip Meyer to determine Unknown  Supporting Information:  Diagnostics:Chest CT Angio done 09-08-14 IMPRESSION: 1. Bilateral pulmonary embolism with nearly occlusive thrombus in the left lower lobe pulmonary artery and occlusive component of thrombus in the lateral basilar segmental branch of the right lower lobe. Nonocclusive thrombus is present in both upper lobes. No evidence of right heart strain by CTA. 2. There may be a component of interstitial edema. There also may be additional airspace disease in the left lower lung and there is a moderate left pleural effusion. 3. Evidence of metastatic malignancy with innumerable bilateral small pulmonary nodules identified as well as multiple liver lesions in the visualized liver. This includes a nearly 11 cm mass in the right lobe of the liver. Etiology of malignancy is not clear and further evaluation of the abdomen and pelvis is recommended by CT with contrast.   Thank You, Alessandra Grout, RN, BSN, CCDS,Clinical Documentation Specialist:  [phone]  [phone]=[employer]- Health Information Management

## 2014-09-08 NOTE — Progress Notes (Addendum)
*  PRELIMINARY RESULTS* Vascular Ultrasound Lower Extremity Arterial Duplex has been completed.  Preliminary findings: Left = Patent arteries with no evidence of stenosis or occlusion. Atypical waveforms noted, etiology unknown.  Incidentally = DVT appears to have extended further in LLE. Duplex from a few days ago noted only thrombus in Lt common femoral. It appears to be in the entire lt femoral and popliteal veins, as well.   VASCULAR LAB PRELIMINARY  ARTERIAL  ABI completed: Difficult to Doppler pedal arteries due to significant edema. Pressures may be underestimated.     RIGHT    LEFT    PRESSURE WAVEFORM  PRESSURE WAVEFORM  BRACHIAL 137 Tri BRACHIAL    DP   DP    AT   AT 89 Bi  PT   PT 119 Bi  PER   PER    GREAT TOE  NA GREAT TOE  NA    RIGHT LEFT  ABI  0.87, mild arterial disease      Landry Mellow, RDMS, RVT  09/08/2014, 5:31 PM

## 2014-09-08 NOTE — Progress Notes (Addendum)
  PROGRESS NOTE MEDICINE TEACHING ATTENDING   Day 5 of stay Patient name: Phillip Meyer   Medical record number: [id number] Date of birth: [birth year]   Mr Comes does not report much improvement in shortness of breath. He reports improvement in pleuritic chest pain and cough. He has not spiked a fever since he arrived.   Blood pressure 101/60, pulse 96, temperature 97.6 F (36.4 C), temperature source Oral,  Resp. rate 20, height 5\' 11"  (1.803 m), weight 289 lb 7.4 oz (131.3 kg), SpO2 97 %.  General: comfortable, eating lunch, no obvious distress. HEENT: PERRL, EOMI, no scleral icterus. Heart: Irregular irregular, no rubs, murmurs or gallops. Lungs: Better air movement than Friday, however still crackles at bases.  Abdomen: Soft, nontender, nondistended, BS present. Extremities: Pedal edema 2+, left foot ice cold to touch as compared to right foot. Cannot feel pulses in left foot. Sensation intact. No color changes so far on left foot.  Neuro: Alert and oriented X3, cranial nerves II-XII grossly intact,  strength and sensation to light touch equal in bilateral upper and lower extremities   Recent CBC trend  Lab 09/05/14 0420 09/07/14 0429 09/08/14 0530  HGB 11.6* 12.3* 13.1  HCT 36.9* 38.4* 41.3  WBC 16.3* 14.7* 15.8*  PLT 257 231 197    CTA findings 09/08/14  1. Bilateral pulmonary embolism with nearly occlusive thrombus in the left lower lobe pulmonary artery and occlusive component of thrombus in the lateral basilar segmental branch of the right lower lobe. Nonocclusive thrombus is present in both upper lobes. No evidence of right heart strain by CTA. 2. There may be a component of interstitial edema. There also may be additional airspace disease in the left lower lung and there is a moderate left pleural effusion. 3. Evidence of metastatic malignancy with innumerable bilateral small pulmonary nodules identified as well as multiple liver lesions in the visualized liver. This  includes a nearly 11 cm mass in the right lobe of the liver. Etiology of malignancy is not clear and further evaluation of the abdomen and pelvis is recommended by CT with contrast.   Assessment  and Plan  74 year old man with shortness of breath - No clinical improvement in pneumonia/CHF with aggressive antibiotic treatment and diuresis prompted a CTA chest which revealed the above. 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   On coumadin for DVT. Will continue unless vascular want to do a procedure.  CT with contrast Abd pelv for further evaluation of malignancy.   Escalate antibiotic coverage given little improvement and WBC trending up.  Keep diuresis at current level.   Monitor platelets.   Cold left foot - Acute arterial vaso-occlusion? Chronic changes? Previous ABIs done in 2014 and 2015 show no vaso-occlusive disease bilaterally.  Check for pulses with doppler.  Repeat ABIs  Urgent vascular consult.   Further imaging per vascular.   Rest chronic issues per resident note. I have discussed the care of this patient with my IM team residents - Dr Raelene Bott and Dr Alice Rieger.  Please see the resident note for details. [place], [place] 09/08/2014, 2:06 PM.

## 2014-09-08 NOTE — Progress Notes (Addendum)
ANTICOAGULATION CONSULT NOTE - Follow Up Consult  Pharmacy Consult: Heparin Indication: atrial fibrillation; new bilateral PE  Allergies  Allergen Reactions  . Elavil [Amitriptyline] Other (See Comments)    Passes out    Patient Measurements: Height: 5\' 11"  (180.3 cm) Weight: 289 lb 7.4 oz (131.3 kg) IBW/kg (Calculated) : 75.3  Heparin dosing wt: 105 kg  Vital Signs:    Labs:  Recent Labs  09/06/14 0441 09/07/14 0429 09/08/14 0530  HGB  --  12.3* 13.1  HCT  --  38.4* 41.3  PLT  --  231 197  LABPROT 30.3* 31.5* 35.7*  INR 2.87* 3.01* 3.54*  CREATININE 0.67 0.65 0.73    Estimated Creatinine Clearance: 111.9 mL/min (by C-G formula based on Cr of 0.73).   Assessment: 9 YOM on Coumadin PTA for history of Afib and DVT in November of 2015. Pt now with acute bilateral PE and DVT. No R heart strain noted on CT chest. Chest CT also shows evidence of metastatic malignancy. IM MD (Dr. Luan Moore) called ~1700 and stated that he had discussed with hem/onc MD and they suggested switching pt to heparin with new thromboembolus on therapeutic coumadin. INR currently supra-therapeutic. CBC stable. No bleeding reported.  Goal of Therapy:  Heparin level 0.3-0.7 units/ml Will monitor platelets per anticoagulation protocol   Plan:  - Start heparin gtt at 1600 units/hr. No bolus with increased INR. - Heparin level in 8 hours - Daily heparin level and CBC - Will continue daily INRs to ensure it continues to trend down  Sherlon Handing, PharmD, BCPS Clinical pharmacist, pager [phone] 09/08/2014, 5:02 PM

## 2014-09-08 NOTE — Progress Notes (Signed)
  Echocardiogram 2D Echocardiogram has been performed.  Phillip Meyer 09/08/2014, 4:39 PM

## 2014-09-08 NOTE — Progress Notes (Signed)
LOS: 5 days   Subjective: Pt states that he is feeling slightly better today.   Objective: BP 101/60 mmHg  Pulse 96  Temp(Src) 97.6 F (36.4 C) (Oral)  Resp 20  Ht 5\' 11"  (1.803 m)  Wt 131.3 kg (289 lb 7.4 oz)  BMI 40.39 kg/m2  SpO2 97%  Intake/Output Summary (Last 24 hours) at 09/08/14 8338 Last data filed at 09/07/14 2330  Gross per 24 hour  Intake    240 ml  Output   1500 ml  Net  -1260 ml    Physical Exam:   Physical Exam: General: WDWN, healthy, cooperative, awake and alert, No acute distress. Sitting on his recliner. O2 vial nasal canula at 5l/min Skin: Skin color, texture, turgor normal. No rashes or lesions. HEENT:  Head : Atraumatic, normocephalic. Eye: PERRL, sclera anicteric, no conjunctival injection Nose: no nasal congestion.patent, no discharge Throat: No tonsillar erythema, exudates or enlargement. Mouth: Moist mucus, no lesions, dentition good. Neck: supple, no LAD Heart: Pulse regular rate and rhythm, normal S1S2, no murmurs appreciated.  Chest/ Lung: Normal depth and effort.  No tenderness to palpation Lungs sounds:Diffuse crackles bilaterally.  No rales, rhonchi, wheezing, or rubs. Abdomen: Soft, NTND, +BS, no hepatosplenomegaly noted.  Extremities: LE edema + 2 Right >> left . Warm and well perfused, no clubbing/cyanosis. Radial, pedal pulse +2.  Neuro: CN III-XII grossly intact, sensation intact, spontaneous movement of all limbs, no obvious deficits Labs/Studies: I have reviewed labs and studies from last 24hrs per EMR.  Medications: I have reviewed the patient's current medications.  Assessment/Plan: Active Problems:   IDDM (insulin dependent diabetes mellitus)   CAP (community acquired pneumonia)   HLD (hyperlipidemia)   BP (high blood pressure)   Gout of ankle   Atrial fibrillation   Phillip Meyer is a 74 y.o. male with PMHx of Phillip Meyer is a 74 yo male with an history of type 2 diabetes, hypertension, congestive  heart failure, hyperlipidemia, A-fib, and gout who presents with a productive cough and bilateral chest pain on inspiration, has been on IV antibiotics for 4 days and has persistent hypoxia requiring oxygen supplementation through nasal canula.   # CAP: WBC count increased to 15.8 from 14.7 yesterday. Repeat CXR done yesterday shows " no convincing change from the previous study with persistent left upper lobe lingula and left lowe lobe consolidation and middle opacity of the right lung base" .  Blood culture NGTD.  Strept pneumonia urine antigen negative, Legionella antigen also negative. Dyspnea on exertion and rest still present., pt still oxygen dependent thought nasal canula.  Unsure of the reason for the slow evolution of symptoms. Will obtain serum pro calcitonin and CT chest. Pt will probably need to escalate on antibiotherapy for broader coverage. The possibility of PE is also considered even though his INR has been therapeutic and he has subacute right lower extremity DVT.   - CT chest w an w/u contrast.  -obtain procalcitonin  -continue Rocephin IV and evaluate after reviewing results of CT and procalcitonin.  -Continue Zithromax IV and evaluate after reviewing results of CT and procalcitonine. -continue incentive spirometry - continue flutter valve - Oxygen as needed  # Right lower extremity subacute DVT:  preliminary report on LE dopplers on 08/1914 shows:  subacute DVT noted in the right common femoral, proximal femoral, and left common femoral veins. Pt have been on Coumadin since home and INR remains therapeutic.   - CT chest to exclude PE  - continue Coumadin .   #  Congestive heart failure. Pt had BNP mildly elevated at 270.5 on admission, he states that he takes 200 mg of lasix at home on a daily basis, lasix have been on hold on admission due to low blood pressure. He was stated on IV lasix 80 mg BIV on Friday 09/05/14 then on Sunday 0/20/16 his home regiment was restarted with  lasix 80 mg in the morning and 120 mg in the evening. There is a regression on his lower extremity edema but pt still have dyspnea as discussed above. Net output of 2.3 liters.   - Continue lasix 80 mg AM and 120 mg PM.  -continue home diltiazem -hold home enalapril d/t BP still soft.  -strict I & O -Daily weight -fluid restriction.   # Atrial fibrillation : pt has chronic A-fib and he takes Cardizem 120 mg daily, Sotalol 80 mg BID and takes Coumadin for anticoagulation. INR is supra therapeutic today at 3.54. Pt home Sotalol was restarted on 09/04/14  . Metoprolol was started on the pt and have been titrated up to 25 mg BID, then stopped on 09/07/14. There was concern on having pt on both Sotalol and Metoprolol which are both beta blockers. Pt is currently in A-fib rate, heart rate is controlled in the 90's. - appreciate cardiology consult.   - Continue home Sotalol -Continue Cardizem 30 mg Q6 h.  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 He takes Gabapentin for his neuropathy and Reglan for gastroparesis. Pt had a couple of episode of hypoglycemia  during this admission. Last one was yesterday at 7. She was already on a lower dose of basal insuline( 45 U at bedtime) and meal coverage Novolin 15 TID. This was reduced accordingly.  - continue SSI - Lantus at 30 U at bedtime - Novolog 10 U TID - gabapentin 600mg  QID - Reglan 5 mg before meals.   # Gout:  - continue home Colchicine  # Hypertension: pt  is normotensive for the most part. BP: 101/60-117/60 -Continue Diltiazem 30 mg Q 6 h -Continue home Sotalol -Start lasix IV 80 mg BID   # Hyperlipidemia :  -Continue home Zocor 10 mg.   # FEN:  -Diet: Carb Modified  #DVT prophylaxis: heparin  CODE STATUS: FULL CODE   This is a Careers information officer Note. The care of the patient  was discussed with Dr. Raelene Bott and the assessment and plan was formulated with their assistance. Please see their note for official documentation of the patient encounter.

## 2014-09-08 NOTE — Consult Note (Signed)
Hospital Consult    Reason for Consult:  Possible ischemic left leg Referring Physician:  Dene Gentry MRN #:  [id number]  History of Present Illness: Phillip Meyer is a 74 y.o. male  who presented to the hospital on 08/21/2014 for CP/SOB.  He is on coumadin for atrial fibrillation and has been for ~ 5 years.  The son states that the pt was on coumadin and developed a DVT back in November after he came off the coumadin for a lumbar procedure.  His medication was changed to Eliquis, but Medicare does not cover this and he was changed back to coumadin and has since been difficult to regulate.    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  He states this is from previous back surgeries.  He states that his pain with this hospitalization is no different from the pain he has had prior to admission.  His son states that he started having leg swelling about 4-5 weeks ago.    On this admission, he was found to have DVT's in the right CFV, proximal femoral vein and left CFV's.  He was also found to have bilateral PE's on chronic coumadin with a therapeutic INR.  On CT scan, he does have innumerable bilateral small pulmonary nodules as well as multiple liver lesions with a nearly 11cm mass in the right lobe of the liver.    He states that he does not smoke and has never smoked.  He states that he had some abdominal pain on admission, but this has improved.  He does have diabetes and takes insulin for this.  He does take a statin for his hyperlipidemia.  He is on a beta blocker, ACEI and CCB for his Afib/HTN.  Past Medical History  Diagnosis Date  . Hypertension   . CHF (congestive heart failure)   . Arthritis   . Atrial fibrillation   . Left foot drop     wears brace  . Hyperlipidemia   . History of snoring     pt had sleep study but does not have sleep apnea  . Atrial fibrillation   . Pneumonia 09/08/2014  . 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 Back surgery      x6  . Cardiac catheterization      [facility]  . Laminectomy with posterior lateral arthrodesis level 1 N/A 03/27/2013    Procedure: REEXPLORATION OF LUMBAR FUSION WITH REPLACEMENT OF SACRAL SCREWS AND ILIAC CREST BONE GRAFT REDO DECOMPRESSION LAMINECTOMY LUMBAR FIVE SACRAL ONE;  Surgeon: Elaina Hoops, MD;  Location: [place] [facility];  Service: Neurosurgery;  Laterality: N/A;  . Hardware removal N/A 10/16/2013    Procedure: Lumbar Hardware Removal;  Surgeon: Elaina Hoops, MD;  Location: [place] [facility];  Service: Neurosurgery;  Laterality: N/A;    Allergies  Allergen Reactions  . Elavil [Amitriptyline] Other (See Comments)    Passes out    Prior to Admission medications   Medication Sig Start Date End Date Taking? Authorizing Provider  APPLE CIDER VINEGAR PO Take 200 mg by mouth 2 (two) times daily.   Yes Historical Provider, MD  aspirin EC 81 MG tablet Take 81 mg by mouth daily.   Yes Historical Provider, MD  colchicine 0.6 MG tablet Take 0.6 mg by mouth 4 (four) times daily as  needed (gout).    Yes Historical Provider, MD  diltiazem (CARDIZEM CD) 120 MG 24 hr capsule Take 120 mg by mouth daily.   Yes Historical Provider, MD  enalapril (VASOTEC) 20 MG tablet Take 20 mg by mouth daily.    Yes Historical Provider, MD  ferrous sulfate 325 (65 FE) MG tablet Take 325 mg by mouth daily with breakfast.   Yes Historical Provider, MD  furosemide (LASIX) 40 MG tablet Take 80-120 mg by mouth 2 (two) times daily. 3 in the morning and 2 tablets at dinner   Yes Historical Provider, MD  gabapentin (NEURONTIN) 600 MG tablet Take 600 mg by mouth 2 (two) times daily.    Yes Historical Provider, MD  insulin glargine (LANTUS) 100 UNIT/ML injection Inject 60 Units into the skin at bedtime.   Yes Historical Provider, MD  insulin regular (NOVOLIN R,HUMULIN R) 100 units/mL injection Inject 30  Units into the skin 3 (three) times daily before meals.   Yes Historical Provider, MD  meclizine (ANTIVERT) 25 MG tablet Take 25 mg by mouth 2 (two) times daily.    Yes Historical Provider, MD  metoCLOPramide (REGLAN) 5 MG tablet Take 5 mg by mouth 3 (three) times daily before meals.   Yes Historical Provider, MD  Multiple Vitamins-Minerals (MULTIVITAMIN WITH MINERALS) tablet Take 1 tablet by mouth daily.   Yes Historical Provider, MD  Omega-3 Fatty Acids (FISH OIL) 1200 MG CAPS Take 1 capsule by mouth 2 (two) times daily.   Yes Historical Provider, MD  OVER THE COUNTER MEDICATION Take 2-3 tablets by mouth daily. Equate brand stool softener.  Take 2 tablets one day and 3 tablets the next day.   Yes Historical Provider, MD  Oxycodone HCl 10 MG TABS Take 10 mg by mouth daily as needed (pain).  07/18/14  Yes Historical Provider, MD  predniSONE (STERAPRED UNI-PAK) 10 MG tablet 4 tabs x 2 days, 3 tabs x 2 days, 2 tabs x 2 days, then 1 tab x 2 days 09/01/14  Yes Historical Provider, MD  rOPINIRole (REQUIP) 2 MG tablet Take 2 mg by mouth 2 (two) times daily.   Yes Historical Provider, MD  simvastatin (ZOCOR) 10 MG tablet Take 10 mg by mouth at bedtime.   Yes Historical Provider, MD  sotalol (BETAPACE) 80 MG tablet Take 80 mg by mouth 2 (two) times daily.   Yes Historical Provider, MD  traMADol (ULTRAM) 50 MG tablet Take 2 tablets (100 mg total) by mouth every 6 (six) hours as needed for moderate pain or severe pain. 05/14/14  Yes Rushil Sherrye Payor, MD  warfarin (COUMADIN) 5 MG tablet Take 2.5-5 mg by mouth daily. 2.5 mg on T, Thu, and Sat and 5 mg on the other days   Yes Historical Provider, MD  Rivaroxaban (XARELTO) 15 MG TABS tablet Take 1 tablet (15 mg total) by mouth 2 (two) times daily with a meal. 05/14/14 06/02/14  Rushil Sherrye Payor, MD    History   Social History  . Marital Status: Widowed    Spouse Name: N/A  . Number of Children: N/A  . Years of Education: N/A   Occupational History  . Not on  file.   Social History Main Topics  . Smoking status: Never Smoker   . Smokeless tobacco: Never Used  . Alcohol Use: No  . Drug Use: No  . Sexual Activity: Not on file   Other Topics Concern  . Not on file   Social History Narrative  Family History  Problem Relation Age of Onset  . Heart disease Mother   . CAD Brother     ROS: [x]  Positive   [ ]  Negative   [ ]  All sytems reviewed and are negative  Cardiovascular: [x]  chest pain/pressure [x]  CHF [x]  Afib []  palpitations [x]  SOB lying flat [x]  DOE []  pain in legs while walking []  pain in legs at rest []  pain in legs at night []  non-healing ulcers [x]  hx of DVT [x]  swelling in legs  Pulmonary: []  productive cough []  asthma/wheezing []  home O2  Neurologic: []  weakness in []  arms []  legs [x]  numbness in []  arms [x]  left foot []  hx of CVA []  mini stroke [] difficulty speaking or slurred speech []  temporary loss of vision in one eye []  dizziness  Hematologic: []  hx of cancer []  bleeding problems []  problems with blood clotting easily  Endocrine:   [x]  diabetes []  thyroid disease  GI []  vomiting blood []  blood in stool  GU: []  CKD/renal failure []  HD--[]  M/W/F or []  T/T/S []  burning with urination []  blood in urine  Psychiatric: []  anxiety []  depression  Musculoskeletal: [x]  arthritis [x]  hx of multiple back surgeries []  joint pain  Integumentary: []  rashes []  ulcers  Constitutional: []  fever []  chills   Physical Examination  Filed Vitals:   09/08/14 0500  BP: 101/60  Pulse: 96  Temp: 97.6 F (36.4 C)  Resp: 20   Body mass index is 40.39 kg/(m^2).  General:  WDWN in NAD Gait: Not observed HENT: WNL, normocephalic Pulmonary: mildly labored-just back from restroom, decreased BS at bases bilaterally, without Rales, rhonchi,  wheezing Cardiac: irregular, without  Murmurs, rubs or gallops; without carotid bruits Abdomen: soft, NT/ND, no masses Skin: without rashes, without  ulcers  Vascular Exam/Pulses:  Right Left  Radial trace 2+ (normal)  DP + doppler signal Absent doppler signal  PT + doppler signal + doppler signal   Extremities: with ischemic changes, without Gangrene , without cellulitis; without open wounds; bilateral cyanotic changes of feet with left > right.  Left foot is cool to touch compared to the right.  2-3+edema BLE Musculoskeletal: no muscle wasting or atrophy  Neurologic: A&O X 3; Appropriate Affect ; SENSATION: normal; MOTOR FUNCTION:  moving all extremities equally. Speech is fluent/normal Psychiatric: Judgment intact, Mood & affect appropriate for pt's clinical situation Lymph : No Cervical, Axillary, or Inguinal lymphadenopathy    CBC    Component Value Date/Time   WBC 15.8* 09/08/2014 0530   RBC 4.25 09/08/2014 0530   HGB 13.1 09/08/2014 0530   HCT 41.3 09/08/2014 0530   PLT 197 09/08/2014 0530   MCV 97.2 09/08/2014 0530   MCH 30.8 09/08/2014 0530   MCHC 31.7 09/08/2014 0530   RDW 14.2 09/08/2014 0530   LYMPHSABS 2.1 05/12/2014 1705   MONOABS 1.0 05/12/2014 1705   EOSABS 0.2 05/12/2014 1705   BASOSABS 0.0 05/12/2014 1705    BMET    Component Value Date/Time   NA 137 09/08/2014 0530   K 4.0 09/08/2014 0530   CL 87* 09/08/2014 0530   CO2 39* 09/08/2014 0530   GLUCOSE 98 09/08/2014 0530   BUN 16 09/08/2014 0530   CREATININE 0.73 09/08/2014 0530   CALCIUM 9.0 09/08/2014 0530   GFRNONAA 89* 09/08/2014 0530   GFRAA >90 09/08/2014 0530    COAGS: Lab Results  Component Value Date   INR 3.54* 09/08/2014   INR 3.01* 09/07/2014   INR 2.87* 09/06/2014   .bcx  Non-Invasive Vascular  Imaging:   ABI's and LLE arterial duplex are pending.  CTA chest 09/08/14: IMPRESSION: 1. Bilateral pulmonary embolism with nearly occlusive thrombus in the left lower lobe pulmonary artery and occlusive component of thrombus in the lateral basilar segmental branch of the right lower lobe. Nonocclusive thrombus is present in both  upper lobes. No evidence of right heart strain by CTA. 2. There may be a component of interstitial edema. There also may be additional airspace disease in the left lower lung and there is a moderate left pleural effusion. 3. Evidence of metastatic malignancy with innumerable bilateral small pulmonary nodules identified as well as multiple liver lesions in the visualized liver. This includes a nearly 11 cm mass in the right lobe of the liver. Etiology of malignancy is not clear and further evaluation of the abdomen and pelvis is recommended by CT with contrast.   Statin:  Yes.   Beta Blocker:  Yes.   Aspirin:  Yes.   ACEI:  Yes.   ARB:  No. Other antiplatelets/anticoagulants:  Yes.   Coumadin   ASSESSMENT/PLAN: This is a 74 y.o. male with a complicated medical hx of afib on coumadin who developed DVT's with coumadin therapeutic/supratherapeutic.  He currently has an acute pulmonary embolus, DVT, and likely metastatic cancer.  - He does have a doppler signal in the left PT.  Per Dr. Bridgett Larsson: -He currently is pain free in regards to the left leg. He has a longstanding history of neurologic loss in the left foot with foot drop >2 years from spinal procedures. Additionally, he thinks his left foot numbness has been present for >3 months. On exam, he extensive bilateral leg edema without easily palpable pulses. R foot is warm but left foot is cool. L leg is warm down to distal lower leg. He has bilateral cyanotic skin changes. In past outpatient studies, he had intact arterial flow to both legs.  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 - In this setting, any revascularization procedure is likely futile, as combined venous and arterial thrombosis is a presentation of end-stage of the neoplasm. - Additionally, this patient's history is not consistent with acute arterial occlusion given the  relative lack of sx. His exam is of limited value due to pre-existing neurologic loss in that L foot. - Will be back after STAT LLE arterial duplex to re-evaluate the patient.   Leontine Locket, PA-C [facility] [phone]  Addendum  I have independently interviewed and examined the patient, and I agree with the physician assistant's findings.    LLE Arterial duplex(09/08/2014) : biphasic flow throughout including AT and PT.  Read by myself.  Despite the physical findings, the arterial duplex of the L leg demonstrate no obvious arterial stenosis and intact biphasic tibial artery flow, so this patient has intact bilateral feet perfusion.  His physical findings likely reflect some degree of chronic venous insufficiency with the etiology of such in the right leg related to his DVT.  His neurologic sx in his L foot are not arterial in etiology given the duplex findings.  In this patient's case, I doubt any benefit to IVC filter placement, as his cancer related thrombophilia might result in IVC filter clotting.  Additionally, since the patient has relatively limited sx at this point, I doubt any benefit to considering ultrasound catheter thrombolysis of the PE.  Available as needed.  Adele Barthel, MD [facility] [place] Office: [phone] Pager:  [phone]  09/08/2014, 8:03 PM

## 2014-09-08 NOTE — Progress Notes (Signed)
   Daily Progress Note  I briefly examined and spoke with this patient.  He currently is pain free in regards to the left leg.  He has a longstanding history of neurologic loss in the left foot with foot drop >2 years from spinal procedures.  Additionally, he thinks his left foot numbness has been present for >3 months.  On exam, he extensive bilateral leg edema without easily palpable pulses.  R foot is warm but left foot is cool.  L leg is warm down to distal lower leg.  He has bilateral cyanotic skin changes.  In past outpatient studies, he had intact arterial flow to both legs.  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 - In this setting, any revascularization procedure is likely futile, as combined venous and arterial thrombosis is a presentation of end-stage of the neoplasm. - Additionally, this patient's history is not consistent with acute arterial occlusion given the relative lack of sx.  His exam is of limited value due to pre-existing neurologic loss in that L foot. - Will be back after STAT LLE arterial duplex to re-evaluate the patient.  Adele Barthel, MD [facility] [place] Office: [phone] Pager: [phone]  09/08/2014, 3:16 PM

## 2014-09-08 NOTE — Progress Notes (Addendum)
Patient Name: Phillip Meyer Date of Encounter: 09/08/2014     Active Problems:   IDDM (insulin dependent diabetes mellitus)   CAP (community acquired pneumonia)   HLD (hyperlipidemia)   BP (high blood pressure)   Gout of ankle   Atrial fibrillation    SUBJECTIVE  Still with dyspnea and LE edema. Not back to baseline   CURRENT MEDS . azithromycin  500 mg Oral Q24H  . cefTRIAXone (ROCEPHIN)  IV  1 g Intravenous Q24H  . colchicine  0.6 mg Oral Daily  . dextromethorphan-guaiFENesin  1 tablet Oral BID  . diltiazem  30 mg Oral 4 times per day  . docusate sodium  200 mg Oral Daily  . ferrous sulfate  325 mg Oral Q breakfast  . furosemide  120 mg Oral q1800  . furosemide  80 mg Oral QAC breakfast  . gabapentin  600 mg Oral BID  . insulin aspart  0-15 Units Subcutaneous TID WC  . insulin aspart  0-5 Units Subcutaneous QHS  . insulin aspart  10 Units Subcutaneous TID WC  . insulin glargine  30 Units Subcutaneous QHS  . metoCLOPramide  5 mg Oral TID AC  . multivitamin with minerals  1 tablet Oral Daily  . rOPINIRole  2 mg Oral BID  . simvastatin  10 mg Oral QHS  . sodium chloride  3 mL Intravenous Q12H  . sotalol  80 mg Oral BID  . Warfarin - Pharmacist Dosing Inpatient   Does not apply q1800    OBJECTIVE  Filed Vitals:   09/07/14 1328 09/07/14 1432 09/07/14 2137 09/08/14 0500  BP: 113/67 113/72 117/60 101/60  Pulse:  80 92 96  Temp:  98 F (36.7 C) 98.2 F (36.8 C) 97.6 F (36.4 C)  TempSrc:  Oral Oral Oral  Resp:  20 20 20   Height:      Weight:    289 lb 7.4 oz (131.3 kg)  SpO2:  95% 95% 97%    Intake/Output Summary (Last 24 hours) at 09/08/14 1149 Last data filed at 09/07/14 2330  Gross per 24 hour  Intake      0 ml  Output   1500 ml  Net  -1500 ml   Filed Weights   09/06/14 0524 09/07/14 0528 09/08/14 0500  Weight: 289 lb 11 oz (131.4 kg) 286 lb 13.1 oz (130.1 kg) 289 lb 7.4 oz (131.3 kg)    PHYSICAL EXAM  General: Pleasant, NAD. Neuro: Alert  and oriented X 3. Moves all extremities spontaneously. Psych: Normal affect. HEENT:  Normal  Neck: Supple without bruits. + JVD. Lungs:  Resp regular and unlabored, crackles bilateral bases Heart: irreg irreg no s3, s4, or murmurs. Abdomen: Soft, non-tender, non-distended, BS + x 4.  Extremities: No clubbing, cyanosis. 2+ bilateral LE edema  Accessory Clinical Findings  CBC  Recent Labs  09/07/14 0429 09/08/14 0530  WBC 14.7* 15.8*  HGB 12.3* 13.1  HCT 38.4* 41.3  MCV 95.5 97.2  PLT 231 967   Basic Metabolic Panel  Recent Labs  09/07/14 0429 09/08/14 0530  NA 137 137  K 3.6 4.0  CL 90* 87*  CO2 37* 39*  GLUCOSE 130* 98  BUN 16 16  CREATININE 0.65 0.73  CALCIUM 8.5 9.0    TELE  Afib. Rates not well cotnrolled this AM. Now low 100s. Frequent PVCs and runs of NSVT  Radiology/Studies  Dg Chest 2 View  09/07/2014   CLINICAL DATA:  Dyspnea.  Recent diagnosis of pneumonia.  EXAM: CHEST  2 VIEW  COMPARISON:  09/02/2014.  FINDINGS: There is persistent opacity in the lung bases, greater on the left, now mostly obscuring the left heart border and left hemidiaphragm on the frontal view. On the lateral view, left lung base opacity projects in the left upper lobe lingula and left lower lobe. Mild streaky opacity in the right lung base is stable.  No other areas of lung consolidation. No pulmonary edema. There is no convincing pleural effusion. No pneumothorax.  Heart is mildly enlarged.  IMPRESSION: 1. Slight changes in the appearance of the lung base opacity on the left is likely due to larger lung volumes on the current exam and differences in radiographic technique and positioning. Overall, there has been no convincing change from the previous study with persistent left upper lobe lingular and left lower lobe consolidation and milder opacity at the right lung base. Right basilar opacity may all be atelectasis or additional infiltrate or a combination.   Electronically Signed   By:  Lajean Manes M.D.   On: 09/07/2014 09:33   Dg Chest 2 View  08/24/2014   CLINICAL DATA:  Bilateral chest pain.  Shortness of breath.  EXAM: CHEST - 2 VIEW  COMPARISON:  Two-view chest x-ray 10/11/2013.  FINDINGS: The heart is enlarged. Lung volumes are low. Bilateral middle lobe and lingular airspace disease is present. No definite effusions are present. There is likely some disease in the left lower lobe as well.  IMPRESSION: 1. New bilateral airspace disease, predominately within the lingula and right middle lobe. This is most concerning for pneumonia. 2. Mild pulmonary vascular congestion is likely reactive. 3. Mild lower lobe airspace disease is present as well.   Electronically Signed   By: San Morelle M.D.   On: 09/04/2014 11:05    ASSESSMENT AND PLAN  74 year old male with past medical history of PAF on coumadin and sotalol, nonischemic cardiomyopathy ED 70-62%, chronic systolic/diastolic CHF, diabetes mellitus, hypertension admitted to [facility] on 3/16 with pneumonia. Cardiology consulted for evaluation of atrial fibrillation and now acute on chronic CHF.   Paroxysmal atrial fibrillation-the patient has recurrent atrial fibrillation. In reviewing his notes from [facility] he was seen in clinic in January 2016 and appeared to have been in sinus at that time. He has been in persistant afib since admission  -- On chronic coumadin. CHADS vasc is 3. Note he has current subacute R LE DVT. INR is supratherapeutic today @ 3.54 -- Elevated rates initially likely driven by his pneumonia.  -- Currently on oral dilt 30mg  qid, sotalol 80mg  bid, and coumadin. His HRS have not been well controlled today but per family member he has been up more today.  -- Some question from prior notes about titrating up dilt vs metoprolol for better rate control given his mild systolic dysfuction. From primary cardiologists notes appears he he was on dilt long acting 120mg  daily, coumadin, and sotalol 80mg  bid.  Will continue with there strategy and titrate up his diltiazem, with only mild LV systolic dysfunction this is reasonable option. Defer to primary if would consider changing to beta blocker at some point in follow up.  -- HR controlled in low 100s currently. Consider converting  dilt 30mg  qid to long acting.   Nonischemic cardiomyopathy- EF 40-45% -- He was on an ACE inhibitor at the time of admission. This is being held as his blood pressure is borderline. Would resume at lower dose as blood pressure improves.  Chronic combined systolic/diastolic congestive heart  failure-  -- 2D echo 02/2014 [facility] LVEF 40-45%, indeterminate diastolic function but evidence of elevated LA pressures, borderline reduced RV function. -- CXR with pneumonia, mild congestion thought likely to be reactive. Pt had BNP mildly elevated at 270.5 on admission -- He takes 200 mg of lasix at home on a daily basis, lasix have been on hold on admission due to low blood pressure. He was stated on IV lasix 80 mg BIV on Friday 09/05/14 then on Sunday 0/20/16 his home regiment was restarted with lasix 80 mg in AM and 120 mg in PM -- Net output of 10 liters since admission. Weight down 3lbs since admission (291--> 289 lbs) -- Home enalapril on hold with soft BPs -- Still with dysnpea and LE edema. May need more IV diuresis. Will defer to Dr. Burt Knack  Right lower extremity subacute DVT: preliminary report on LE dopplers on 08/1914 shows: subacute DVT noted in the right common femoral, proximal femoral, and left common femoral veins. Pt have been on Coumadin since home and INR remains therapeutic.  --  Plan is for CT chest to exclude PE today although this is unlikely with therapeutic INRs -- Continue Coumadin   PNA-antibiotics per primary care.  NSVT- tele with afib with  frequent PVCs and runs of NSVT (3-4 beat runs)  Signed, Eileen Stanford PA-C  Pager [phone]  Patient seen, examined. Available data reviewed.  Agree with findings, assessment, and plan as outlined by Nell Range, PA-C. The patient is independently interviewed and examined. Clinically he has marked volume overload with jugular venous distention and lower extremity edema.  I think we need to push IV diuretics. Will discontinue his oral diuretics and start back on an IV regimen. Also will update echo.  Otherwise as per above.  Sherren Mocha, M.D. 09/08/2014 2:06 PM

## 2014-09-09 ENCOUNTER — Inpatient Hospital Stay (HOSPITAL_COMMUNITY): Payer: Medicare Other

## 2014-09-09 ENCOUNTER — Encounter (HOSPITAL_COMMUNITY): Payer: Self-pay | Admitting: Cardiology

## 2014-09-09 DIAGNOSIS — I471 Supraventricular tachycardia: Secondary | ICD-10-CM

## 2014-09-09 DIAGNOSIS — C800 Disseminated malignant neoplasm, unspecified: Secondary | ICD-10-CM

## 2014-09-09 DIAGNOSIS — I2699 Other pulmonary embolism without acute cor pulmonale: Secondary | ICD-10-CM | POA: Diagnosis present

## 2014-09-09 DIAGNOSIS — C801 Malignant (primary) neoplasm, unspecified: Secondary | ICD-10-CM

## 2014-09-09 DIAGNOSIS — I999 Unspecified disorder of circulatory system: Secondary | ICD-10-CM

## 2014-09-09 DIAGNOSIS — E119 Type 2 diabetes mellitus without complications: Secondary | ICD-10-CM

## 2014-09-09 HISTORY — DX: Disseminated malignant neoplasm, unspecified: C80.0

## 2014-09-09 LAB — URINE MICROSCOPIC-ADD ON

## 2014-09-09 LAB — GLUCOSE, CAPILLARY
Glucose-Capillary: 115 mg/dL — ABNORMAL HIGH (ref 70–99)
Glucose-Capillary: 105 mg/dL — ABNORMAL HIGH (ref 70–99)
Glucose-Capillary: 176 mg/dL — ABNORMAL HIGH (ref 70–99)
Glucose-Capillary: 129 mg/dL — ABNORMAL HIGH (ref 70–99)
Glucose-Capillary: 183 mg/dL — ABNORMAL HIGH (ref 70–99)

## 2014-09-09 LAB — BASIC METABOLIC PANEL
Anion gap: 11 (ref 5–15)
BUN: 23 mg/dL (ref 6–23)
CALCIUM: 8.5 mg/dL (ref 8.4–10.5)
CHLORIDE: 90 mmol/L — AB (ref 96–112)
CO2: 35 mmol/L — ABNORMAL HIGH (ref 19–32)
Creatinine, Ser: 0.69 mg/dL (ref 0.50–1.35)
GFR calc Af Amer: >90 mL/min (ref >90)
GLUCOSE: 98 mg/dL (ref 70–99)
POTASSIUM: 3.8 mmol/L (ref 3.5–5.1)
Sodium: 136 mmol/L (ref 135–145)

## 2014-09-09 LAB — CBC
HCT: 37.3 % — ABNORMAL LOW (ref 39.0–52.0)
Hemoglobin: 12.1 g/dL — ABNORMAL LOW (ref 13.0–17.0)
MCH: 30.7 pg (ref 26.0–34.0)
MCHC: 32.4 g/dL (ref 30.0–36.0)
MCV: 94.7 fL (ref 78.0–100.0)
PLATELETS: 217 10*3/uL (ref 150–400)
RBC: 3.94 MIL/uL — AB (ref 4.22–5.81)
RDW: 14.3 % (ref 11.5–15.5)
WBC: 17.4 10*3/uL — AB (ref 4.0–10.5)

## 2014-09-09 LAB — URINALYSIS, ROUTINE W REFLEX MICROSCOPIC
BILIRUBIN URINE: NEGATIVE
Glucose, UA: NEGATIVE mg/dL
HGB URINE DIPSTICK: NEGATIVE
KETONES UR: NEGATIVE mg/dL
Nitrite: NEGATIVE
PROTEIN: NEGATIVE mg/dL
Specific Gravity, Urine: 1.018 (ref 1.005–1.030)
Urobilinogen, UA: 0.2 mg/dL (ref 0.0–1.0)
pH: 5.5 (ref 5.0–8.0)

## 2014-09-09 LAB — HEPARIN LEVEL (UNFRACTIONATED)
HEPARIN UNFRACTIONATED: 0.24 [IU]/mL — AB (ref 0.30–0.70)
Heparin Unfractionated: 0.43 IU/mL (ref 0.30–0.70)
Heparin Unfractionated: 0.53 IU/mL (ref 0.30–0.70)

## 2014-09-09 LAB — PROTIME-INR
INR: 2.97 — ABNORMAL HIGH (ref 0.00–1.49)
Prothrombin Time: 31.1 seconds — ABNORMAL HIGH (ref 11.6–15.2)

## 2014-09-09 MED ORDER — METOPROLOL TARTRATE 25 MG PO TABS
25.0000 mg | ORAL_TABLET | Freq: Two times a day (BID) | ORAL | Status: DC
  Administered 2014-09-09 – 2014-09-15 (×13): 25 mg via ORAL
  Filled 2014-09-09 (×14): qty 1

## 2014-09-09 MED ORDER — IOHEXOL 300 MG/ML  SOLN
100.0000 mL | Freq: Once | INTRAMUSCULAR | Status: AC | PRN
  Administered 2014-09-09: 100 mL via INTRAVENOUS

## 2014-09-09 MED ORDER — IOHEXOL 300 MG/ML  SOLN
50.0000 mL | Freq: Once | INTRAMUSCULAR | Status: AC | PRN
  Administered 2014-09-09: 50 mL via ORAL

## 2014-09-09 MED ORDER — IOHEXOL 300 MG/ML  SOLN
25.0000 mL | INTRAMUSCULAR | Status: AC
  Administered 2014-09-09 (×2): 25 mL via ORAL

## 2014-09-09 MED ORDER — CETYLPYRIDINIUM CHLORIDE 0.05 % MT LIQD
7.0000 mL | Freq: Two times a day (BID) | OROMUCOSAL | Status: DC
  Administered 2014-09-09 – 2014-09-15 (×14): 7 mL via OROMUCOSAL

## 2014-09-09 MED ORDER — FUROSEMIDE 10 MG/ML IJ SOLN
80.0000 mg | Freq: Two times a day (BID) | INTRAMUSCULAR | Status: DC
  Administered 2014-09-09 – 2014-09-14 (×10): 80 mg via INTRAVENOUS
  Filled 2014-09-09 (×10): qty 8

## 2014-09-09 NOTE — Progress Notes (Signed)
  PROGRESS NOTE MEDICINE TEACHING ATTENDING   Day 6 of stay Patient name: Phillip Meyer   Medical record number: [id number] Date of birth: 1941-05-11    Mr Lalla continues to feel short of breath. He continues to be on 5L of oxygen.    CTA results have been conveyed to the patient and his family. Today we proceed with CT abdomen pelvis to evaluate and look for a primary lesion. We continue antibiotics. Would do a UA additionally to work up for the increasing WBC. The patient continues to be on heparin for PE.   For the left foot findings (minor erythema today around ankle), arterial duplex did not find any occlusion, although ABI findings measured 0.8 as compared to 1.2 from 2014. Vascular surgery recommendations appreciated. The patient himself feels occassional pain (throbbing) in his foot with no sensory changes. At this time, it does not look like this is due to arterial occlusion.  Afib is being managed per cardiology recommendations. Has been restarted on BB today.  CHF is being managed per cardiology recommendations - lasix 80 mg IV restarted today. The patient has been diuresed >11 liters of fluid in this hospital stay.   I have discussed the care of this patient with my IM team residents. Please see the resident note for details.  Madilyn Fireman 09/09/2014, 12:54 PM.

## 2014-09-09 NOTE — Progress Notes (Signed)
PHARMACY NOTE  Pharmacy Consult :  74 y.o. male is currently on Heparin infusion for history of AFib and DVT + new PEs and DVTs.   Dosing Wt :  105 kg  Hematology :  Recent Labs  09/07/14 0429 09/08/14 0530 09/09/14 0405 09/09/14 1338 09/09/14 2018  HGB 12.3* 13.1 12.1*  --   --   HCT 38.4* 41.3 37.3*  --   --   PLT 231 197 217  --   --   LABPROT 31.5* 35.7* 31.1*  --   --   INR 3.01* 3.54* 2.97*  --   --   HEPARINUNFRC  --   --  0.24* 0.43 0.53  CREATININE 0.65 0.73 0.69  --   --    Current Infusion: Infusions:  . heparin 1,900 Units/hr (09/09/14 0943)   Assessment :  Heparin infusing at 1900 units/hr.    Heparin level 0.53 units/ml, within the therapeutic range.  No evidence of bleeding complications observed.  Goal : Heparin Level  0.3 - 0.7 units/ml  Plan : 1. Continue Heparin infusion at 1900 units/hr.  2. Continue Daily Heparin level, CBC while on Heparin.  Monitor for bleeding complications. Follow Platelet counts.  Marthenia Rolling, Pharm.D. 09/09/2014  9:10 PM

## 2014-09-09 NOTE — Progress Notes (Signed)
Patient Name: Phillip Meyer Date of Encounter: 09/09/2014     Active Problems:   IDDM (insulin dependent diabetes mellitus)   CAP (community acquired pneumonia)   HLD (hyperlipidemia)   BP (high blood pressure)   Gout of ankle   Atrial fibrillation   Pulmonary embolus   Disseminated malignancy of unknown primary    SUBJECTIVE  Still with dyspnea and LE edema. States breathing is a little better today. No chest pain.   CURRENT MEDS . ceFEPime (MAXIPIME) IV  2 g Intravenous Q12H  . colchicine  0.6 mg Oral Daily  . dextromethorphan-guaiFENesin  1 tablet Oral BID  . diltiazem  30 mg Oral 4 times per day  . docusate sodium  200 mg Oral Daily  . ferrous sulfate  325 mg Oral Q breakfast  . furosemide  80 mg Intravenous 3 times per day  . gabapentin  600 mg Oral BID  . insulin aspart  0-15 Units Subcutaneous TID WC  . insulin aspart  0-5 Units Subcutaneous QHS  . insulin aspart  10 Units Subcutaneous TID WC  . insulin glargine  30 Units Subcutaneous QHS  . iohexol  25 mL Oral Q1 Hr x 2  . metoCLOPramide  5 mg Oral TID AC  . multivitamin with minerals  1 tablet Oral Daily  . rOPINIRole  2 mg Oral BID  . simvastatin  10 mg Oral QHS  . sodium chloride  3 mL Intravenous Q12H  . sotalol  80 mg Oral BID  . vancomycin  1,000 mg Intravenous Q12H    OBJECTIVE  Filed Vitals:   09/08/14 2000 09/08/14 2139 09/08/14 2341 09/09/14 0519  BP:  129/69 127/78 100/60  Pulse:  100 116 96  Temp:  98.6 F (37 C)  97.7 F (36.5 C)  TempSrc:  Oral  Oral  Resp: 18 20  18   Height:      Weight:    293 lb 10.4 oz (133.2 kg)  SpO2:  96% 96% 95%    Intake/Output Summary (Last 24 hours) at 09/09/14 0736 Last data filed at 09/09/14 4782  Gross per 24 hour  Intake 420.33 ml  Output   1850 ml  Net -1429.67 ml   Filed Weights   09/07/14 0528 09/08/14 0500 09/09/14 0519  Weight: 286 lb 13.1 oz (130.1 kg) 289 lb 7.4 oz (131.3 kg) 293 lb 10.4 oz (133.2 kg)    PHYSICAL EXAM  General:  Pleasant, NAD. Neuro: Alert and oriented X 3. Moves all extremities spontaneously. Psych: Normal affect. HEENT:  Normal  Neck: Supple without bruits. + JVD. Lungs:  Resp regular and unlabored, crackles bilateral bases Heart: irreg irreg no s3, s4, or murmurs. Abdomen: Soft, non-tender, non-distended, BS + x 4.  Extremities: Bilateral cyanotic feet L>R. Left foot is cool. 2+ bilateral LE edema,  Accessory Clinical Findings  CBC  Recent Labs  09/08/14 0530 09/09/14 0405  WBC 15.8* PENDING  HGB 13.1 12.1*  HCT 41.3 37.3*  MCV 97.2 94.7  PLT 197 956   Basic Metabolic Panel  Recent Labs  09/08/14 0530 09/09/14 0405  NA 137 136  K 4.0 3.8  CL 87* 90*  CO2 39* 35*  GLUCOSE 98 98  BUN 16 23  CREATININE 0.73 0.69  CALCIUM 9.0 8.5    TELE  Afib. Rates 97 bpm. HR variable with activity. Frequent PVCs and short 6 beat run NSVT.  Radiology/Studies  Dg Chest 2 View  09/07/2014   CLINICAL DATA:  Dyspnea.  Recent  diagnosis of pneumonia.  EXAM: CHEST  2 VIEW  COMPARISON:  09/01/2014.  FINDINGS: There is persistent opacity in the lung bases, greater on the left, now mostly obscuring the left heart border and left hemidiaphragm on the frontal view. On the lateral view, left lung base opacity projects in the left upper lobe lingula and left lower lobe. Mild streaky opacity in the right lung base is stable.  No other areas of lung consolidation. No pulmonary edema. There is no convincing pleural effusion. No pneumothorax.  Heart is mildly enlarged.  IMPRESSION: 1. Slight changes in the appearance of the lung base opacity on the left is likely due to larger lung volumes on the current exam and differences in radiographic technique and positioning. Overall, there has been no convincing change from the previous study with persistent left upper lobe lingular and left lower lobe consolidation and milder opacity at the right lung base. Right basilar opacity may all be atelectasis or additional  infiltrate or a combination.   Electronically Signed   By: Lajean Manes M.D.   On: 09/07/2014 09:33   Dg Chest 2 View  08/29/2014   CLINICAL DATA:  Bilateral chest pain.  Shortness of breath.  EXAM: CHEST - 2 VIEW  COMPARISON:  Two-view chest x-ray 10/11/2013.  FINDINGS: The heart is enlarged. Lung volumes are low. Bilateral middle lobe and lingular airspace disease is present. No definite effusions are present. There is likely some disease in the left lower lobe as well.  IMPRESSION: 1. New bilateral airspace disease, predominately within the lingula and right middle lobe. This is most concerning for pneumonia. 2. Mild pulmonary vascular congestion is likely reactive. 3. Mild lower lobe airspace disease is present as well.   Electronically Signed   By: San Morelle M.D.   On: 08/24/2014 11:05   Ct Angio Chest Pe W/cm &/or Wo Cm  09/08/2014   CLINICAL DATA:  Right lower extremity DVT, shortness of breath and hypoxia.  EXAM: CT ANGIOGRAPHY CHEST WITH CONTRAST  TECHNIQUE: Multidetector CT imaging of the chest was performed using the standard protocol during bolus administration of intravenous contrast. Multiplanar CT image reconstructions and MIPs were obtained to evaluate the vascular anatomy.  CONTRAST:  65mL OMNIPAQUE IOHEXOL 350 MG/ML SOLN  COMPARISON:  Chest x-ray on 09/07/2014  FINDINGS: There is evidence of bilateral pulmonary embolism. Nearly occlusive thrombus is identified in the left lower lobe pulmonary artery. Nonocclusive thrombus is identified in the right lower lobe pulmonary artery with some occlusive component extending into a lateral basilar subsegmental arterial branch. There is some small nonocclusive thrombus in the left upper lobe and right upper lobe. There is associated moderate left pleural effusion. There is airspace disease in the left lower lobe. Component of pneumonia cannot be excluded. Peripheral opacity in the lateral basilar right lower lobe measures roughly 2.0 x 2.7  cm this is in the distribution of occlusive pulmonary embolism and may represent a developing pulmonary infarct.  Innumerable small pulmonary nodules are seen throughout both lungs. The largest discrete nodule in the right lung is a 9 mm right middle lobe nodule. The largest nodule in the left lung is an 8 mm left upper lobe nodule. Masses could be obscured in the left lower lobe due to airspace disease and compressive atelectasis from the pleural effusion. No grossly enlarged lymph nodes are identified in the chest. Interstitial and pulmonary vascular prominence may be consistent with interstitial edema.  No pericardial fluid is seen. The heart size is normal. Extensive  coronary arterial calcifications are seen in a 3 vessel distribution. No evidence of significant right heart strain related to pulmonary embolism with RV to LV diameter ratio of 0.86.  The visualized upper abdomen shows multiple lesions in both lobes of the liver. A large geographic lesion in the superior right lobe measures nearly 11 cm in estimated diameter. Separate 2.7 cm and 5.5 cm left lobe lesions are also visualized. The very last image also shows probable periportal/peripancreatic lymphadenopathy. CT imaging of the abdomen and pelvis is recommended for further evaluation. No focal bony lesions are identified. There is spondylosis in the lower thoracic spine  Review of the MIP images confirms the above findings.  IMPRESSION: 1. Bilateral pulmonary embolism with nearly occlusive thrombus in the left lower lobe pulmonary artery and occlusive component of thrombus in the lateral basilar segmental branch of the right lower lobe. Nonocclusive thrombus is present in both upper lobes. No evidence of right heart strain by CTA. 2. There may be a component of interstitial edema. There also may be additional airspace disease in the left lower lung and there is a moderate left pleural effusion. 3. Evidence of metastatic malignancy with innumerable  bilateral small pulmonary nodules identified as well as multiple liver lesions in the visualized liver. This includes a nearly 11 cm mass in the right lobe of the liver. Etiology of malignancy is not clear and further evaluation of the abdomen and pelvis is recommended by CT with contrast.  Results were called by telephone at the time of interpretation on 09/08/2014 at 1:51 pm to Dr. Luan Moore , who verbally acknowledged these results.   Electronically Signed   By: Aletta Edouard M.D.   On: 09/08/2014 13:54   Echo: Study Conclusions  - Left ventricle: The cavity size was normal. Wall thickness was increased in a pattern of mild LVH. Systolic function was mildly reduced. The estimated ejection fraction was in the range of 45% to 50%. Regional wall motion abnormalities cannot be excluded. - Left atrium: The atrium was mildly dilated.   ASSESSMENT AND PLAN  74 year old male with past medical history of PAF on coumadin and sotalol, nonischemic cardiomyopathy ED 16-10%, chronic systolic/diastolic CHF, diabetes mellitus, hypertension admitted to [facility] on 3/16 with ? PNA. Now diagnosed with DVT and bilateral pulmonary emboli. Findings on CT consistent with metastatic malignancy.  Cardiology consulted for evaluation of atrial fibrillation and acute on chronic CHF.   Paroxysmal atrial fibrillation-the patient has recurrent atrial fibrillation in setting of acute pulmonary embolus. In reviewing his notes from [facility] he was seen in clinic in January 2016 and appeared to have been in sinus at that time. He has been in persistant afib since admission  -- On chronic coumadin. CHADS vasc is 3.  -- Elevated rates initially likely driven by his PE  -- Currently on oral dilt 30mg  qid, sotalol 80mg  bid, and coumadin. His HRs have been labile. Will add low dose metoprolol for additional rate control.  -- Not a good candidate for DCCV at this point with multiple stressors and increased risk of  anesthesia with new PE. Will focus on rate control now. Patient may convert on Sotalol once stress of acute PE improves.   Nonischemic cardiomyopathy- EF 40-45% -- He was on an ACE inhibitor at the time of admission. This is being held as his blood pressure is borderline. Would resume at lower dose as blood pressure improves.  Chronic combined systolic/diastolic congestive heart failure-  -- 2D echo 02/2014 [facility] LVEF  40-45%, indeterminate diastolic function but evidence of elevated LA pressures, borderline reduced RV function. Echo here is similar with EF 45%. RV function reported as normal.  -- CXR with pneumonia, mild congestion thought likely to be reactive. Pt had BNP mildly elevated at 270.5 on admission -- He takes 200 mg of lasix at home on a daily basis, lasix have been on hold on admission due to low blood pressure. He was stated on IV lasix 80 mg BIV on Friday 09/05/14 then on Sunday 0/20/16 his home regiment was restarted with lasix 80 mg in AM and 120 mg in PM -- Net output of 11.4 liters since admission. Weight actually up today 293 lbs despite good diuresis.  -- Home enalapril on hold with soft BPs -- Will reduce IV lasix frequency due to low BP. I suspect a lot of his edema is due to DVT and thrombophilia.  Bilateral lower extremity subacute DVT: preliminary report on LE dopplers on 08/1914 shows: subacute DVT noted in the right common femoral, proximal femoral, and left common femoral veins. Pt have been on Coumadin since home and INR remains therapeutic.   -- Continue Coumadin   Acute pulmonary embolus. Anticoagulation per primary team.  NSVT- tele with afib with  frequent PVCs and runs of NSVT (3-4 beat runs)  Malignancy of unknown primary with metastasis. Work up per primary team.   Signed, Veria Stradley [place] MD,FACC  09/09/2014 7:36 AM

## 2014-09-09 NOTE — Progress Notes (Signed)
Subjective:  Patient reports that his breathing is stable today. He does report that his left leg has been having a throbbing pain that is partially alleviated by his tramadol. He states that this pain is new. Otherwise, patient is not reporting any complaints. He and his family have been notified about the possibility of a metastatic malignancy and understand the future course for workup.  Objective: Vital signs in last 24 hours: Filed Vitals:   09/08/14 2000 09/08/14 2139 09/08/14 2341 09/09/14 0519  BP:  129/69 127/78 100/60  Pulse:  100 116 96  Temp:  98.6 F (37 C)  97.7 F (36.5 C)  TempSrc:  Oral  Oral  Resp: 18 20  18   Height:      Weight:    293 lb 10.4 oz (133.2 kg)  SpO2:  96% 96% 95%   Weight change: 4 lb 3 oz (1.9 kg)  Intake/Output Summary (Last 24 hours) at 09/09/14 0824 Last data filed at 09/09/14 0740  Gross per 24 hour  Intake 420.33 ml  Output   1850 ml  Net -1429.67 ml    General: resting in chair, eating breakfast, nasal cannula in place, currently on 5 L nasal cannula Cardiac: Irregularly irregular, tachycardic, no rubs, murmurs or gallops Pulm: Bibasilar rales and diffuse rhonchi, moving normal volumes of air Abd: soft, nontender, nondistended, BS present Ext: warm and well perfused, 2+ bilateral lower extremity edema, left lower extremity cold with a new cutaneous erythematous change overlying and superior to the ankle Neuro: alert and oriented X3, cranial nerves II-XII grossly intact Skin: no rashes or lesions noted Psych: appropriate affect and cognition  Lab Results: Basic Metabolic Panel:  Recent Labs Lab 08/29/2014 1029  09/08/14 0530 09/09/14 0405  NA 137  < > 137 136  K 3.7  < > 4.0 3.8  CL 98  < > 87* 90*  CO2 30  < > 39* 35*  GLUCOSE 155*  < > 98 98  BUN 18  < > 16 23  CREATININE 0.90  < > 0.73 0.69  CALCIUM 8.4  < > 9.0 8.5  MG 2.2  --   --   --   < > = values in this interval not displayed. Liver Function Tests:  Recent  Labs Lab 09/08/2014 1029 09/04/14 0834  AST 42* 30  ALT 15 13  ALKPHOS 101 93  BILITOT 1.4* 1.2  PROT 6.4 6.1  ALBUMIN 2.7* 2.3*   No results for input(s): LIPASE, AMYLASE in the last 168 hours. No results for input(s): AMMONIA in the last 168 hours. CBC:  Recent Labs Lab 09/08/14 0530 09/09/14 0405  WBC 15.8* 17.4*  HGB 13.1 12.1*  HCT 41.3 37.3*  MCV 97.2 94.7  PLT 197 217   Cardiac Enzymes:  Recent Labs Lab 08/30/2014 1029 08/29/2014 1846 08/20/2014 2131  TROPONINI 0.03 <0.03 0.03   BNP: No results for input(s): PROBNP in the last 168 hours. D-Dimer: No results for input(s): DDIMER in the last 168 hours. CBG:  Recent Labs Lab 09/08/14 0726 09/08/14 1227 09/08/14 1817 09/08/14 2134 09/09/14 0413 09/09/14 0741  GLUCAP 87 178* 114* 206* 115* 105*   Coagulation:  Recent Labs Lab 09/06/14 0441 09/07/14 0429 09/08/14 0530 09/09/14 0405  LABPROT 30.3* 31.5* 35.7* 31.1*  INR 2.87* 3.01* 3.54* 2.97*    Micro Results: Recent Results (from the past 240 hour(s))  Culture, blood (routine x 2) Call MD if unable to obtain prior to antibiotics being given  Status: None (Preliminary result)   Collection Time: 09/02/2014  6:46 PM  Result Value Ref Range Status   Specimen Description BLOOD RIGHT ANTECUBITAL  Final   Special Requests BOTTLES DRAWN AEROBIC AND ANAEROBIC 5CC  Final   Culture   Final           BLOOD CULTURE RECEIVED NO GROWTH TO DATE CULTURE WILL BE HELD FOR 5 DAYS BEFORE ISSUING A FINAL NEGATIVE REPORT Performed at [facility]    Report Status PENDING  Incomplete  Culture, blood (routine x 2) Call MD if unable to obtain prior to antibiotics being given     Status: None (Preliminary result)   Collection Time: 09/07/2014  6:57 PM  Result Value Ref Range Status   Specimen Description BLOOD RIGHT HAND  Final   Special Requests BOTTLES DRAWN AEROBIC ONLY 3CC  Final   Culture   Final           BLOOD CULTURE RECEIVED NO GROWTH TO DATE CULTURE  WILL BE HELD FOR 5 DAYS BEFORE ISSUING A FINAL NEGATIVE REPORT Performed at [facility]    Report Status PENDING  Incomplete   Studies/Results: Dg Chest 2 View  09/07/2014   CLINICAL DATA:  Dyspnea.  Recent diagnosis of pneumonia.  EXAM: CHEST  2 VIEW  COMPARISON:  09/14/2014.  FINDINGS: There is persistent opacity in the lung bases, greater on the left, now mostly obscuring the left heart border and left hemidiaphragm on the frontal view. On the lateral view, left lung base opacity projects in the left upper lobe lingula and left lower lobe. Mild streaky opacity in the right lung base is stable.  No other areas of lung consolidation. No pulmonary edema. There is no convincing pleural effusion. No pneumothorax.  Heart is mildly enlarged.  IMPRESSION: 1. Slight changes in the appearance of the lung base opacity on the left is likely due to larger lung volumes on the current exam and differences in radiographic technique and positioning. Overall, there has been no convincing change from the previous study with persistent left upper lobe lingular and left lower lobe consolidation and milder opacity at the right lung base. Right basilar opacity may all be atelectasis or additional infiltrate or a combination.   Electronically Signed   By: Lajean Manes M.D.   On: 09/07/2014 09:33   Ct Angio Chest Pe W/cm &/or Wo Cm  09/08/2014   CLINICAL DATA:  Right lower extremity DVT, shortness of breath and hypoxia.  EXAM: CT ANGIOGRAPHY CHEST WITH CONTRAST  TECHNIQUE: Multidetector CT imaging of the chest was performed using the standard protocol during bolus administration of intravenous contrast. Multiplanar CT image reconstructions and MIPs were obtained to evaluate the vascular anatomy.  CONTRAST:  37mL OMNIPAQUE IOHEXOL 350 MG/ML SOLN  COMPARISON:  Chest x-ray on 09/07/2014  FINDINGS: There is evidence of bilateral pulmonary embolism. Nearly occlusive thrombus is identified in the left lower lobe pulmonary  artery. Nonocclusive thrombus is identified in the right lower lobe pulmonary artery with some occlusive component extending into a lateral basilar subsegmental arterial branch. There is some small nonocclusive thrombus in the left upper lobe and right upper lobe. There is associated moderate left pleural effusion. There is airspace disease in the left lower lobe. Component of pneumonia cannot be excluded. Peripheral opacity in the lateral basilar right lower lobe measures roughly 2.0 x 2.7 cm this is in the distribution of occlusive pulmonary embolism and may represent a developing pulmonary infarct.  Innumerable small pulmonary nodules  are seen throughout both lungs. The largest discrete nodule in the right lung is a 9 mm right middle lobe nodule. The largest nodule in the left lung is an 8 mm left upper lobe nodule. Masses could be obscured in the left lower lobe due to airspace disease and compressive atelectasis from the pleural effusion. No grossly enlarged lymph nodes are identified in the chest. Interstitial and pulmonary vascular prominence may be consistent with interstitial edema.  No pericardial fluid is seen. The heart size is normal. Extensive coronary arterial calcifications are seen in a 3 vessel distribution. No evidence of significant right heart strain related to pulmonary embolism with RV to LV diameter ratio of 0.86.  The visualized upper abdomen shows multiple lesions in both lobes of the liver. A large geographic lesion in the superior right lobe measures nearly 11 cm in estimated diameter. Separate 2.7 cm and 5.5 cm left lobe lesions are also visualized. The very last image also shows probable periportal/peripancreatic lymphadenopathy. CT imaging of the abdomen and pelvis is recommended for further evaluation. No focal bony lesions are identified. There is spondylosis in the lower thoracic spine  Review of the MIP images confirms the above findings.  IMPRESSION: 1. Bilateral pulmonary  embolism with nearly occlusive thrombus in the left lower lobe pulmonary artery and occlusive component of thrombus in the lateral basilar segmental branch of the right lower lobe. Nonocclusive thrombus is present in both upper lobes. No evidence of right heart strain by CTA. 2. There may be a component of interstitial edema. There also may be additional airspace disease in the left lower lung and there is a moderate left pleural effusion. 3. Evidence of metastatic malignancy with innumerable bilateral small pulmonary nodules identified as well as multiple liver lesions in the visualized liver. This includes a nearly 11 cm mass in the right lobe of the liver. Etiology of malignancy is not clear and further evaluation of the abdomen and pelvis is recommended by CT with contrast.  Results were called by telephone at the time of interpretation on 09/08/2014 at 1:51 pm to Dr. Luan Moore , who verbally acknowledged these results.   Electronically Signed   By: Aletta Edouard M.D.   On: 09/08/2014 13:54   Medications: I have reviewed the patient's current medications. Scheduled Meds: . ceFEPime (MAXIPIME) IV  2 g Intravenous Q12H  . colchicine  0.6 mg Oral Daily  . dextromethorphan-guaiFENesin  1 tablet Oral BID  . diltiazem  30 mg Oral 4 times per day  . docusate sodium  200 mg Oral Daily  . ferrous sulfate  325 mg Oral Q breakfast  . furosemide  80 mg Intravenous BID  . gabapentin  600 mg Oral BID  . insulin aspart  0-15 Units Subcutaneous TID WC  . insulin aspart  0-5 Units Subcutaneous QHS  . insulin aspart  10 Units Subcutaneous TID WC  . insulin glargine  30 Units Subcutaneous QHS  . iohexol  25 mL Oral Q1 Hr x 2  . metoCLOPramide  5 mg Oral TID AC  . metoprolol tartrate  25 mg Oral BID  . multivitamin with minerals  1 tablet Oral Daily  . rOPINIRole  2 mg Oral BID  . simvastatin  10 mg Oral QHS  . sodium chloride  3 mL Intravenous Q12H  . sotalol  80 mg Oral BID  . vancomycin  1,000 mg  Intravenous Q12H   Continuous Infusions: . heparin 1,900 Units/hr (09/09/14 0605)   PRN Meds:.iohexol, ipratropium-albuterol, morphine  injection, sodium chloride, traMADol Assessment/Plan: Active Problems:   IDDM (insulin dependent diabetes mellitus)   CAP (community acquired pneumonia)   HLD (hyperlipidemia)   BP (high blood pressure)   Gout of ankle   Atrial fibrillation   Pulmonary embolus   Disseminated malignancy of unknown primary  Patient is a 74 year old male with a history of type 2 diabetes, hypertension, congestive heart failure, hyperlipidemia, gout admitted for community-acquired pneumonia whose hospital course has been complicated by persistent hypoxia found to be secondary to pulmonary embolism in the setting of likely extensive metastatic disease.  Metastatic cancer of uncertain primary: Patient was found to have innumerable bilateral small pulmonary nodules as well as multiple liver lesions the largest of which measuring 11 cm in diameter in the right lobe of the liver. Chest imaging was not definitive for identifying a primary source for malignancy. Patient also found to have a left moderate pleural effusion which has unknown association with possible malignancy at this point. Given the pattern of metastases, probably favor a liver or GI primary as there is no lung primary that is readily apparent on imaging.  -CT abdomen and pelvis -Consider a diagnostic and therapeutic thoracentesis in the future. -Discontinue Coumadin given preference for heparin in the setting malignancy. -Patient will need an INR of less than 1.5 or 2.0 for a thoracentesis. -Patient will need a INR of less than 1.5 for a liver biopsy according to the guidelines of the Society of interventional radiology. -Initiate unfractionated heparin in the setting of malignancy and possible invasive procedures for tissue diagnosis. In the longer term, patient does not have any renal insufficiency so he may be a  good candidate for low molecular weight heparin therapy after invasive procedures.  Bilateral pulmonary embolism: Nearly occlusive thrombus in the left lower lobe pulmonary artery as well as several other thrombi throughout bilateral lungs. No evidence of right heart strain by CTA. Echocardiogram also corroborates this with no evidence of right heart strain. Patient has maintain adequate blood pressures in the last several days, which argues against any intra-arterial intervention at this point. Patient has maintained a therapeutic INR on Coumadin throughout his hospitalization. It is not entirely clear whether this may represent a overt failure on Coumadin or if the pulmonary emboli may have preceded her current INR measurements. In either case, therapy with heparin is preferred in the setting of malignancy. -Unfractionated heparin  Possible persistent pneumonia: Patient with an up trending white count of 17.4 from 15.8 yesterday. Patient has completed a five-day course of ceftriaxone and azithromycin for community-acquired pneumonia. Patient with an elevated pro-calcitonin of 0.41. Additionally, there is still evidence of left lower lobe airspace disease on CTA. Although radiographic findings may lag behind clinical treatment for pneumonia, who seems to be some evidence that patient could have a resistant pneumonia in addition to his other pulmonary pulmonary processes described elsewhere contributing to his persistent hypoxia. -Started on vancomycin and cefepime yesterday to cover for MRSA and Pseudomonas. -Continue home tramadol for chest and abdominal discomfort. -Mucinex for cough -Nasal saline spray for nasal congestion. -Incentive spirometry, flutter valve, and DuoNeb when necessary  Lower extremity vascular disease: Patient found to have a subacute DVT in the right lower extremity on 09/06/2014. Patient also found to have a cold limb with difficult to palpate pulses in the left lower extremity  yesterday. Today, the leg has become painful with some overlying erythema concerning for thrombophlebitis Vascular surgery was consulted and thought that his physical exam findings reflect some  degree of chronic venous insufficiency. ABI showing mild arterial disease of 0.87. Additionally, any surgical intervention for revascularization would be extremely limited in the setting of a hypercoagulable state secondary to malignancy. Additionally, for the same reason, an IVC filter would likely have little utility given the high risk for primary thrombosis. -Unfractionated heparin -Tramadol and morphine for pain.  Paroxysmal atrial fibrillation:  Patient has remained in atrial fibrillation throughout this admission. Patient has remained in the 90s to low 100s. There is some concern in having patient can currently on metoprolol and sotalol. Patient will need a cardioversion likely back at [facility] once there is resolution of the pneumonia. -Appreciate cardiology recommendations -Diltiazem 30 mg every 6 hours -Metoprolol has been titrated up to 25 mg twice a day -Continue home sotalol to help with sinus rhythm once reestablished.  -Continue with anticoagulation with Coumadin.   Systolic congestive heart failure:  Patient has continued to diuresis with around 11 L net negative over this hospitalization. However, patient still exhibiting signs of fluid overload clinically. Repeat echocardiogram from yesterday showing mildly reduced systolic function of 68-03%. -Increase Lasix dose. Change back to Lasix IV now at 80 mg 3 times a day. -Appreciate cardiology recommendations.  Hypertension:  Patient has been able to sustain satisfactory blood pressures in the low 212Y systolic over the last 24 hours. At home, patient is on diltiazem 120 mg daily, enalapril 20 mg daily, Lasix reportedly up to 200 mg daily, sotalol 80 mg twice a day.  -Continue to hold home enalapril given low normal blood pressures  -Blood  pressure management as above with paroxysmal atrial fibrillation.  Type two diabetes:  Patient with no recurrent episodes of hypoglycemia over the last 24 hours. Otherwise, patient has had satisfactory blood glucose control.  -Decreased lantus to 30 units daily  -Continue NovoLog 10 3 times a day before meals  -Moderate sliding scale insulin with nighttime correction   Gout:  Patient with stable pain in his left ankle. Patient currently on colchicine 0.6 mg 4 times a day. It is noted that there is a interaction with diltiazem. -Continue home colchicine at a lower dose of 0.6 mg daily.  Iron deficiency anemia: Patient is on 325 mg ferrous sulfate daily.  -Continue home ferrous sulfate.   Neuropathic pain: Patient is on gabapentin 600 mg twice a day.  -Continue home gabapentin   NSVT- tele with afib with frequent PVCs and runs of NSVT (3-4 beat runs)  Diet: Carbohydrate modified  Prophylaxis: Patient currently on Coumadin and therapeutic  Code status: DNR/DNI, POA is son, Aleksi Brummet cell [phone])  Dispo: Disposition is deferred at this time, awaiting improvement of current medical problems. Anticipated discharge in approximately 2 day(s).   The patient does have a current PCP Zorita Pang, MD) and does need an [facility] hospital follow-up appointment after discharge.  The patient does not have transportation limitations that hinder transportation to clinic appointments.    LOS: 6 days   Services Needed at time of discharge: Y = Yes, Blank = No PT:   OT:   RN:   Equipment:   Other:    Luan Moore, MD 09/09/2014, 8:24 AM

## 2014-09-09 NOTE — Progress Notes (Signed)
Occupational Therapy Treatment Patient Details Name: Phillip Meyer MRN: [id number] DOB: [birth year] Today's Date: 09/09/2014    History of present illness Adm with pna PMHx- DM, multiple back surgeries with Lt foot drop, CHF, gout, RLE DVT. Metastatic cancer of uncertain primary: Patient was found to have innumerable bilateral small pulmonary nodules as well as multiple liver lesions. Bilateral pulmonary embolism.    OT comments  Session focused @ bed level on D/C planning with son/pt. Given current situation, pt will need 24/7 assistance after D/C and family can not provide that level of care. Pt will need to D/C to SNF. Pt prefers to D/C to [facility]. Need clarification on mobility guidelines given PE/DVTs. Thank you  Follow Up Recommendations  SNF;Supervision/Assistance - 24 hour    Equipment Recommendations  None recommended by OT    Recommendations for Other Services      Precautions / Restrictions Precautions Precautions: Fall;Other (comment) Precaution Comments: desaturates with activity Required Braces or Orthoses: Other Brace/Splint Other Brace/Splint: L AFO       Mobility Bed Mobility Overal bed mobility: Modified Independent                Transfers                 General transfer comment: not assessed due to medical condition    Balance                                   ADL                                         General ADL Comments: focus of session assessing D/C plan. Pt ambulating with nursing to bathroom. Pt states his LLE "gave out" on him earlier when walking with nursing. treatment @ bed level due to bedrest orders with bathroom priviledges and recent dx of DVT and PE. Son presetn and states pt will not have 247 assistance available at D/C. Pt reports that his LLE has "turned a darker color" since yesterday.       Vision                     Perception     Praxis      Cognition   Behavior  During Therapy: WFL for tasks assessed/performed Overall Cognitive Status: Within Functional Limits for tasks assessed                       Extremity/Trunk Assessment               Exercises Other Exercises Other Exercises: encouraged BUE AROM as tolerated   Shoulder Instructions       General Comments      Pertinent Vitals/ Pain       Pain Assessment: No/denies pain  Home Living                                          Prior Functioning/Environment              Frequency Min 2X/week     Progress Toward Goals  OT Goals(current goals can now be found in the care plan section)  Progress towards OT  goals: Progressing toward goals  Acute Rehab OT Goals Patient Stated Goal: to get home OT Goal Formulation: With patient/family (son) Time For Goal Achievement: 09/19/14 Potential to Achieve Goals: Good ADL Goals Pt Will Perform Grooming: with modified independence;standing Pt Will Perform Lower Body Bathing: with modified independence;sit to/from stand Pt Will Perform Lower Body Dressing: with modified independence;sit to/from stand Pt Will Transfer to Toilet: with modified independence;ambulating Pt Will Perform Toileting - Clothing Manipulation and hygiene: with modified independence;sit to/from stand Pt Will Perform Tub/Shower Transfer: with modified independence;ambulating;Shower transfer;shower seat  Plan Discharge plan remains appropriate;Frequency needs to be updated    Co-evaluation                 End of Session     Activity Tolerance Patient tolerated treatment well   Patient Left in bed;with call bell/phone within reach   Nurse Communication Mobility status;Other (comment)        Time: 4383-7793 OT Time Calculation (min): 10 min  Charges: OT General Charges $OT Visit: 1 Procedure OT Treatments $Therapeutic Activity: 8-22 mins  Anaika Santillano,HILLARY 09/09/2014, 2:12 PM [facility], OTR/L   [phone] 09/09/2014

## 2014-09-09 NOTE — Progress Notes (Signed)
LOS: 6 days   Subjective: Pt states that he is feeling better overall, has some pain to the left leg.  He is aware of the possible metastatic lung cancer and he is aware of the plan for CT abdomen and pelvis today to look for primary cancer.   Objective: BP 100/60 mmHg  Pulse 96  Temp(Src) 97.7 F (36.5 C) (Oral)  Resp 18  Ht 5\' 11"  (1.803 m)  Wt 133.2 kg (293 lb 10.4 oz)  BMI 40.97 kg/m2  SpO2 95%  Intake/Output Summary (Last 24 hours) at 09/09/14 1044 Last data filed at 09/09/14 0900  Gross per 24 hour  Intake 420.33 ml  Output   1850 ml  Net -1429.67 ml    Physical Exam: General: WDWN, healthy, cooperative, awake and alert, No acute distress. Sitting on his recliner. O2 vial nasal canula at 5l/min Skin: Skin color, texture, turgor normal. No rashes or lesions. HEENT:  Head : Atraumatic, normocephalic. Eye: PERRL, sclera anicteric, no conjunctival injection Nose: no nasal congestion.patent, no discharge Throat: No tonsillar erythema, exudates or enlargement. Mouth: Moist mucus, no lesions, dentition good. Neck: supple, no LAD Heart: Pulse regular rate and rhythm, normal S1S2, no murmurs appreciated.  Chest/ Lung: Normal depth and effort.  No tenderness to palpation Lungs sounds:Diffuse crackles bilaterally.  No rales, rhonchi, wheezing, or rubs. Abdomen: Soft, NTND, +BS, no hepatosplenomegaly noted.  Extremities: LE edema + 2 Right >> left . Left lower extremity cold and red around the ankle. Radial +2, pedal pulse +1.  Neuro: CN III-XII grossly intact, sensation intact, spontaneous movement of all limbs, no obvious deficits Labs/Studies: I have reviewed labs and studies from last 24hrs per EMR.  Medications: I have reviewed the patient's current medications.  Assessment/Plan: Active Problems:   IDDM (insulin dependent diabetes mellitus)   CAP (community acquired pneumonia)   HLD (hyperlipidemia)   BP (high blood pressure)   Gout of ankle  Atrial fibrillation   Pulmonary embolus   Disseminated malignancy of unknown primary   Phillip Meyer is a 74 y.o. male with PMHx of Mr. Dunavan is a 74 yo male with an history of type 2 diabetes, hypertension, congestive heart failure, hyperlipidemia, A-fib, and gout who presents with a productive cough and bilateral chest pain on inspiration have been treated for CAP with IV antibiotics for 5 days with no significant improvement of symptoms, CTA chest done on 09/08/14 was positive for bilateral PE.   # Bilateral PE:  Pt admitted with dyspnea and cough  thought to be due to CAP as suggested by CXR and elevated WBC. CTA chest performed on 09/08/14 showed bilateral PE and  Evidence of metastatic malignancy with innumerable bilateral small pulmonary nodules as well as multiples liver lesions. Of note, pt have also developed bilateral DVT.  Pt is obviously in a hypercoagulability state more likely related to a malignancy process. Pt was already on Coumadin prior to this admission( for A-fib) and his INR remained therapeutic throughout this admission. It is recommended in the setting of malignancy to use unfractionated heparin notably in anticipation for invasive diagnostic procedure and for better management during therapy. -unfractionated heparin. -continuous o2 sat -Oxygen supplementation as needed.  # Bilateral LE DVT with left ankle redness: pt developed bilateral DVT as demonstrated by LE duplex done on 09/06/14. Of note, pt have been on his home coumadin since admission and have been therapeutic. This gives additional concern for hypercoagulability related to malignancy. Pt also concern for LLE coldness and reduction on  ABI to 0.87 from 1.2 (study done 02/27/2013). He also has some redness to the LLE notably to the ankle area.  -vascular surgeon consult.  -unfractionated heparin. -Q4 neurovascular check.  # Possible metastatic lung cancer: CTA chest done on 09/08/14 reports "Evidence of  metastatic malignancy with innumerable bilateral small pulmonary nodules as well as multiples liver lesions ". Unclear of the primary cancer location at this time. Will have CT abdomen and pelvis today.  -CT abdomen and pelvis -d/c coumadin in anticipation for invasive diagnostic procedures and start Heparin.   # CAP: pt has been on IV antibiotics since admission with Ceftriaxone and Zithromax with no significant improvement, WBC elevated at 19.2 on admission was initially trending down , then started to come back up the last couples of days. Pt has no spike of temperature. CTA performed on 09/08/14 showed bilateral PE and airspace disease in the left lower lobe and a moderate left pleural effusion. At this points, not sure if lung findings and elevated WBC are due to pneumonia , cancer process or PE. Arguing for the pneumonia is the elevated procalcitonin at 0.41 as a marker of inflammatory response indicative of bacterial involvement. Antibiotherapy was escalated on 09/08/14 with Vancomycin and Cefepime IV to cover for MRSA and pseudomonas.  - continue Vancomycin and Cefepime. -continuous o2 sat -O 2 supplementation. -Duoneb PRN -incentive spirometry. -Flutter valve -Mucinex BID.  # Atrial fibrillation :pt has chronic A-fib and he takes Cardizem 120 mg daily, Sotalol 80 mg BID at home.. Pt home Sotalol was restarted on 09/04/14 . Metoprolol was started on the pt and have been titrated up to 25 mg BID, then stopped on 09/07/14. Pt HR still not at goal of < 100/min, cardiologist recommends restarting low dose of metoprolol today for better rate control.  - appreciate cardiology consult.  -Metoprolol 12.5 mg BID  - Continue home Sotalol -Continue Cardizem 30 mg Q6 h.  -monitor on telemetry  -coumadin stopped ( pt on Heparin drip)    # Congestive heart failure. Pt CTA showed a component of interstitial edema, pt still have LE edema and lung crackles on auscultation. LE edema could be  explained by the DVT. Lung crackles findings could be attributed to his metastatic lung cancer or pneumonia.  Lasix increased to 80 mg IV TID. - Continue lasix 80 mg TID -continue home diltiazem -hold home enalapril d/t BP still soft.  -strict I & O -Daily weight -fluid restriction.   # Diabetes type 2 : Pt home medication consists on Insuline Lantus 60 U at bed time and Novolin 30 U TID. He takes Gabapentin for his neuropathy and Reglan for gastroparesis. Pt had a couple of episode of hypoglycemia during this admission. Last one was on 09/07/14 at 49. She was already on a lower dose of basal insuline( 45 U at bedtime) and meal coverage Novolin 15 TID. This was reduced accordingly.Last CBG= 105.  - continue sensitive SSI - Lantus at 30 U at bedtime - Novolog 10 U TID - gabapentin 600mg  QID - Reglan 5 mg before meals.   # Gout:  - continue home Colchicine  # Hypertension: pt is normotensive for the most part. BP: 100/60-129/69 -Continue Diltiazem 30 mg Q 6 h -Continue home Sotalol -Start lasix IV 80 mg BID   # Hyperlipidemia :  -Continue home Zocor 10 mg.    PPx: IV heparin   Diet: Carb modified diet  Dispo:  This is a Careers information officer Note.  The care of the patient  was discussed with Dr. Raelene Bott and the assessment and plan formulated with their assistance.  Please see their attached note for official documentation of the daily encounter.

## 2014-09-09 NOTE — Progress Notes (Signed)
ANTICOAGULATION CONSULT NOTE - Follow Up Consult  Pharmacy Consult for Heparin  Indication: atrial fibrillation, new bilateral PE while on warfarin   Allergies  Allergen Reactions  . Elavil [Amitriptyline] Other (See Comments)    Passes out    Patient Measurements: Height: 5\' 11"  (180.3 cm) Weight: 293 lb 10.4 oz (133.2 kg) IBW/kg (Calculated) : 75.3  Vital Signs: Temp: 97.7 F (36.5 C) (03/22 0519) Temp Source: Oral (03/22 0519) BP: 100/60 mmHg (03/22 0519) Pulse Rate: 96 (03/22 0519)  Labs:  Recent Labs  09/07/14 0429 09/08/14 0530 09/09/14 0405  HGB 12.3* 13.1  --   HCT 38.4* 41.3  --   PLT 231 197  --   LABPROT 31.5* 35.7* 31.1*  INR 3.01* 3.54* 2.97*  HEPARINUNFRC  --   --  0.24*  CREATININE 0.65 0.73  --     Estimated Creatinine Clearance: 112.9 mL/min (by C-G formula based on Cr of 0.73).   Assessment: Sub-therapeutic heparin level, noted elevated INR but heparin was started due to new bilateral PE while on warfarin. No bleeding issues per RN.   Goal of Therapy:  Heparin level 0.3-0.7 units/ml Monitor platelets by anticoagulation protocol: Yes   Plan:  -Increase heparin to 1900 units/hr -1300 HL -Daily CBC/HL -Monitor for bleeding  Narda Bonds 09/09/2014,5:58 AM

## 2014-09-09 NOTE — Progress Notes (Signed)
ANTICOAGULATION CONSULT NOTE - Follow Up Consult  Pharmacy Consult:  Heparin Indication:  History of AFib and DVT + new PEs and DVTs  Allergies  Allergen Reactions  . Elavil [Amitriptyline] Other (See Comments)    Passes out    Patient Measurements: Height: 5\' 11"  (180.3 cm) Weight: 293 lb 10.4 oz (133.2 kg) IBW/kg (Calculated) : 75.3 Heparin Dosing Weight: 105 kg  Vital Signs: Temp: 98.3 F (36.8 C) (03/22 1352) Temp Source: Oral (03/22 1352) BP: 111/59 mmHg (03/22 1352) Pulse Rate: 81 (03/22 1352)  Labs:  Recent Labs  09/07/14 0429 09/08/14 0530 09/09/14 0405 09/09/14 1338  HGB 12.3* 13.1 12.1*  --   HCT 38.4* 41.3 37.3*  --   PLT 231 197 217  --   LABPROT 31.5* 35.7* 31.1*  --   INR 3.01* 3.54* 2.97*  --   HEPARINUNFRC  --   --  0.24* 0.43  CREATININE 0.65 0.73 0.69  --     Estimated Creatinine Clearance: 112.9 mL/min (by C-G formula based on Cr of 0.69).     Assessment: 66 YOM on Coumadin PTA for history of Afib and DVT in November of 2015.  Patient's INRs have been therapeutic or supra-therapeutic and found to have new PEs and DVTs that extended throughout the left leg.  Currently on IV heparin and heparin level is therapeutic.  No bleeding reported.   Goal of Therapy:  Heparin level 0.3-0.7 units/ml Monitor platelets by anticoagulation protocol: Yes  Vanc trough 15-20 mcg/mL    Plan:  - Continue heparin gtt at 1900 units/hr - Daily HL / CBC - Check confirmatory HL given clot burden - Vanc 1gm IV Q12H - Cefepime 2gm IV Q12H - Monitor renal fxn, clinical progress, vanc trough as indicated    Lonny Eisen D. Mina Marble, PharmD, BCPS Pager:  [phone] 09/09/2014, 2:43 PM

## 2014-09-10 ENCOUNTER — Inpatient Hospital Stay (HOSPITAL_COMMUNITY): Payer: Medicare Other

## 2014-09-10 LAB — CULTURE, BLOOD (ROUTINE X 2)
CULTURE: NO GROWTH
Culture: NO GROWTH

## 2014-09-10 LAB — BASIC METABOLIC PANEL
ANION GAP: 12 (ref 5–15)
BUN: 19 mg/dL (ref 6–23)
CALCIUM: 8.6 mg/dL (ref 8.4–10.5)
CO2: 31 mmol/L (ref 19–32)
Chloride: 90 mmol/L — ABNORMAL LOW (ref 96–112)
Creatinine, Ser: 0.71 mg/dL (ref 0.50–1.35)
Glucose, Bld: 119 mg/dL — ABNORMAL HIGH (ref 70–99)
Potassium: 4.9 mmol/L (ref 3.5–5.1)
SODIUM: 133 mmol/L — AB (ref 135–145)

## 2014-09-10 LAB — GLUCOSE, CAPILLARY
GLUCOSE-CAPILLARY: 169 mg/dL — AB (ref 70–99)
GLUCOSE-CAPILLARY: 138 mg/dL — AB (ref 70–99)
Glucose-Capillary: 118 mg/dL — ABNORMAL HIGH (ref 70–99)
Glucose-Capillary: 134 mg/dL — ABNORMAL HIGH (ref 70–99)

## 2014-09-10 LAB — PROTIME-INR
INR: 2.03 — AB (ref 0.00–1.49)
Prothrombin Time: 23.1 seconds — ABNORMAL HIGH (ref 11.6–15.2)

## 2014-09-10 LAB — PROCALCITONIN: Procalcitonin: 0.4 ng/mL

## 2014-09-10 LAB — BODY FLUID CELL COUNT WITH DIFFERENTIAL
Eos, Fluid: 10 %
LYMPHS FL: 13 %
MONOCYTE-MACROPHAGE-SEROUS FLUID: 14 % — AB (ref 50–90)
Neutrophil Count, Fluid: 63 % — ABNORMAL HIGH (ref 0–25)
WBC FLUID: 1759 uL — AB (ref 0–1000)

## 2014-09-10 LAB — LACTATE DEHYDROGENASE, PLEURAL OR PERITONEAL FLUID: LD, Fluid: 364 U/L — ABNORMAL HIGH (ref 3–23)

## 2014-09-10 LAB — CBC
HEMATOCRIT: 40.1 % (ref 39.0–52.0)
Hemoglobin: 13.2 g/dL (ref 13.0–17.0)
MCH: 30.9 pg (ref 26.0–34.0)
MCHC: 32.9 g/dL (ref 30.0–36.0)
MCV: 93.9 fL (ref 78.0–100.0)
Platelets: 261 10*3/uL (ref 150–400)
RBC: 4.27 MIL/uL (ref 4.22–5.81)
RDW: 14.3 % (ref 11.5–15.5)
WBC: 17.9 10*3/uL — ABNORMAL HIGH (ref 4.0–10.5)

## 2014-09-10 LAB — PROTEIN, BODY FLUID: TOTAL PROTEIN, FLUID: 3.6 g/dL

## 2014-09-10 LAB — GLUCOSE, SEROUS FLUID: Glucose, Fluid: 157 mg/dL

## 2014-09-10 LAB — HEPARIN LEVEL (UNFRACTIONATED)
HEPARIN UNFRACTIONATED: 1.06 [IU]/mL — AB (ref 0.30–0.70)
Heparin Unfractionated: 0.36 IU/mL (ref 0.30–0.70)

## 2014-09-10 LAB — PH, BODY FLUID: PH, FLUID: 8.5

## 2014-09-10 MED ORDER — LIDOCAINE HCL (PF) 1 % IJ SOLN
INTRAMUSCULAR | Status: AC
  Filled 2014-09-10: qty 10

## 2014-09-10 NOTE — Progress Notes (Signed)
   Daily Progress Note  Asked to see pt again.  I recommended repeating the LLE arterial duplex.  I doubt it will be any different.  In this patient with widely metastatic cancer, I doubt that his L leg will have any impact on his life-expectancy.  I suspect a pallative approach will necessary once a final tissue diagnosis is available.  In this setting, the leg issues are minor.  - Reconsult if the LLE arterial duplex demonstrate any deterioration from the recent study  - Again, the bigger picture issues in this patient should drive his care, [facility].   Adele Barthel, MD [facility] Office: [phone] Pager: [phone]  09/10/2014, 2:40 PM

## 2014-09-10 NOTE — Progress Notes (Signed)
Referring Physician(s): Dr. Ellwood Dense  Subjective: 74 yo male admitted with resp distress. Underlying hx of Afib, on chronic Coumadin. Found to have DVT and PE, started on IV heparin drip. CT of the chest alos finds multiple liver lesions concerning for metastatic malignant process. IR is asked to eval for tissue biopsy. Coumadin has been held and INR is trending down since admission level of 3.54 Chart, PMHx, imaging, labs reviewed with Dr. Anselm Pancoast  Past Medical History  Diagnosis Date  . Hypertension   . CHF (congestive heart failure)   . Arthritis   . Atrial fibrillation   . Left foot drop     wears brace  . Hyperlipidemia   . History of snoring     pt had sleep study but does not have sleep apnea  . Atrial fibrillation   . Pneumonia 09/15/2014  . 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 Multiple myeloma    Past Surgical History  Procedure Laterality Date  . Back surgery      x6  . Cardiac catheterization      [facility]  . Laminectomy with posterior lateral arthrodesis level 1 N/A 03/27/2013    Procedure: REEXPLORATION OF LUMBAR FUSION WITH REPLACEMENT OF SACRAL SCREWS AND ILIAC CREST BONE GRAFT REDO DECOMPRESSION LAMINECTOMY LUMBAR FIVE SACRAL ONE;  Surgeon: Elaina Hoops, MD;  Location: [place] [facility];  Service: Neurosurgery;  Laterality: N/A;  . Hardware removal N/A 10/16/2013    Procedure: Lumbar Hardware Removal;  Surgeon: Elaina Hoops, MD;  Location: [place] [facility];  Service: Neurosurgery;  Laterality: N/A;   History   Social History  . Marital Status: Widowed    Spouse Name: N/A  . Number of Children: N/A  . Years of Education: N/A   Occupational History  . Not on file.   Social History Main Topics  . Smoking status: Never Smoker   . Smokeless tobacco: Never Used  . Alcohol Use: No  . Drug Use: No  . Sexual Activity: Not on file   Other Topics Concern  . Not on file   Social  History Narrative    Allergies: Elavil  Medications:  Current facility-administered medications:  .  antiseptic oral rinse (CPC / CETYLPYRIDINIUM CHLORIDE 0.05%) solution 7 mL, 7 mL, Mouth Rinse, BID, Madilyn Fireman, MD, 7 mL at 09/10/14 0934 .  colchicine tablet 0.6 mg, 0.6 mg, Oral, Daily, Luan Moore, MD, 0.6 mg at 09/10/14 7035 .  dextromethorphan-guaiFENesin (MUCINEX DM) 30-600 MG per 12 hr tablet 1 tablet, 1 tablet, Oral, BID, Luan Moore, MD, 1 tablet at 09/10/14 [phone]  diltiazem (CARDIZEM) tablet 30 mg, 30 mg, Oral, 4 times per day, Arnoldo Lenis, MD, 30 mg at 09/10/14 8182 .  docusate sodium (COLACE) capsule 200 mg, 200 mg, Oral, Daily, Lucious Groves, DO, 200 mg at 09/10/14 0930 .  ferrous sulfate tablet 325 mg, 325 mg, Oral, Q breakfast, Lucious Groves, DO, 325 mg at 09/10/14 0745 .  furosemide (LASIX) injection 80 mg, 80 mg, Intravenous, BID, Peter M Martinique, MD, 80 mg at 09/10/14 0746 .  gabapentin (NEURONTIN) tablet 600 mg, 600 mg, Oral, BID, Lucious Groves, DO, 600 mg at 09/10/14 9937 .  heparin ADULT infusion 100 units/mL (25000 units/250 mL), 1,900 Units/hr, Intravenous, Continuous, Erenest Blank, RPH, Last Rate: 19 mL/hr at 09/09/14 2353, 1,900 Units/hr at 09/09/14 2353 .  insulin aspart (novoLOG) injection 0-15 Units, 0-15 Units,  Subcutaneous, TID WC, Lucious Groves, DO, 3 Units at 09/09/14 1251 .  insulin aspart (novoLOG) injection 0-5 Units, 0-5 Units, Subcutaneous, QHS, Lucious Groves, DO, 2 Units at 09/08/14 2148 .  insulin aspart (novoLOG) injection 10 Units, 10 Units, Subcutaneous, TID WC, Luan Moore, MD, 10 Units at 09/10/14 [phone]  insulin glargine (LANTUS) injection 30 Units, 30 Units, Subcutaneous, QHS, Luan Moore, MD, 30 Units at 09/09/14 2200 .  ipratropium-albuterol (DUONEB) 0.5-2.5 (3) MG/3ML nebulizer solution 3 mL, 3 mL, Nebulization, Q4H PRN, Luan Moore, MD .  lidocaine (PF) (XYLOCAINE) 1 % injection, , , ,  .  metoCLOPramide (REGLAN) tablet  5 mg, 5 mg, Oral, TID AC, Lucious Groves, DO, 5 mg at 09/10/14 0745 .  metoprolol tartrate (LOPRESSOR) tablet 25 mg, 25 mg, Oral, BID, Peter M Martinique, MD, 25 mg at 09/10/14 0930 .  morphine 2 MG/ML injection 1-2 mg, 1-2 mg, Intravenous, Q2H PRN, Luan Moore, MD, 2 mg at 09/10/14 1042 .  multivitamin with minerals tablet 1 tablet, 1 tablet, Oral, Daily, Madilyn Fireman, MD, 1 tablet at 09/10/14 0930 .  rOPINIRole (REQUIP) tablet 2 mg, 2 mg, Oral, BID, Lucious Groves, DO, 2 mg at 09/10/14 3734 .  simvastatin (ZOCOR) tablet 10 mg, 10 mg, Oral, QHS, Lucious Groves, DO, 10 mg at 09/09/14 2156 .  sodium chloride (OCEAN) 0.65 % nasal spray 1 spray, 1 spray, Each Nare, PRN, Luan Moore, MD .  sodium chloride 0.9 % injection 3 mL, 3 mL, Intravenous, Q12H, Lucious Groves, DO, 3 mL at 09/09/14 2201 .  sotalol (BETAPACE) tablet 80 mg, 80 mg, Oral, BID, Luan Moore, MD, 80 mg at 09/10/14 2876 .  traMADol (ULTRAM) tablet 100 mg, 100 mg, Oral, Q6H PRN, Luan Moore, MD, 100 mg at 09/10/14 0750   Review of Systems  Vital Signs: BP 102/51 mmHg  Pulse 93  Temp(Src) 98.3 F (36.8 C) (Oral)  Resp 16  Ht 5' 11"  (1.803 m)  Wt 295 lb 13.7 oz (134.2 kg)  BMI 41.28 kg/m2  SpO2 97%  Physical Exam  Constitutional: He is oriented to person, place, and time. He appears well-developed and well-nourished.  Neck: Normal range of motion. No JVD present. No tracheal deviation present.  Cardiovascular: Normal rate, regular rhythm and normal heart sounds.   Pulmonary/Chest: Effort normal.  Diminished left basilar BS  Abdominal: Soft. Bowel sounds are normal.  Neurological: He is alert and oriented to person, place, and time.  Psychiatric: He has a normal mood and affect. Judgment normal.    Imaging: Dg Chest 2 View  09/07/2014   CLINICAL DATA:  Dyspnea.  Recent diagnosis of pneumonia.  EXAM: CHEST  2 VIEW  COMPARISON:  08/20/2014.  FINDINGS: There is persistent opacity in the lung bases, greater on the left,  now mostly obscuring the left heart border and left hemidiaphragm on the frontal view. On the lateral view, left lung base opacity projects in the left upper lobe lingula and left lower lobe. Mild streaky opacity in the right lung base is stable.  No other areas of lung consolidation. No pulmonary edema. There is no convincing pleural effusion. No pneumothorax.  Heart is mildly enlarged.  IMPRESSION: 1. Slight changes in the appearance of the lung base opacity on the left is likely due to larger lung volumes on the current exam and differences in radiographic technique and positioning. Overall, there has been no convincing change from the previous study with persistent left upper lobe lingular and left  lower lobe consolidation and milder opacity at the right lung base. Right basilar opacity may all be atelectasis or additional infiltrate or a combination.   Electronically Signed   By: Lajean Manes M.D.   On: 09/07/2014 09:33   Ct Angio Chest Pe W/cm &/or Wo Cm  09/08/2014   CLINICAL DATA:  Right lower extremity DVT, shortness of breath and hypoxia.  EXAM: CT ANGIOGRAPHY CHEST WITH CONTRAST  TECHNIQUE: Multidetector CT imaging of the chest was performed using the standard protocol during bolus administration of intravenous contrast. Multiplanar CT image reconstructions and MIPs were obtained to evaluate the vascular anatomy.  CONTRAST:  15m OMNIPAQUE IOHEXOL 350 MG/ML SOLN  COMPARISON:  Chest x-ray on 09/07/2014  FINDINGS: There is evidence of bilateral pulmonary embolism. Nearly occlusive thrombus is identified in the left lower lobe pulmonary artery. Nonocclusive thrombus is identified in the right lower lobe pulmonary artery with some occlusive component extending into a lateral basilar subsegmental arterial branch. There is some small nonocclusive thrombus in the left upper lobe and right upper lobe. There is associated moderate left pleural effusion. There is airspace disease in the left lower lobe.  Component of pneumonia cannot be excluded. Peripheral opacity in the lateral basilar right lower lobe measures roughly 2.0 x 2.7 cm this is in the distribution of occlusive pulmonary embolism and may represent a developing pulmonary infarct.  Innumerable small pulmonary nodules are seen throughout both lungs. The largest discrete nodule in the right lung is a 9 mm right middle lobe nodule. The largest nodule in the left lung is an 8 mm left upper lobe nodule. Masses could be obscured in the left lower lobe due to airspace disease and compressive atelectasis from the pleural effusion. No grossly enlarged lymph nodes are identified in the chest. Interstitial and pulmonary vascular prominence may be consistent with interstitial edema.  No pericardial fluid is seen. The heart size is normal. Extensive coronary arterial calcifications are seen in a 3 vessel distribution. No evidence of significant right heart strain related to pulmonary embolism with RV to LV diameter ratio of 0.86.  The visualized upper abdomen shows multiple lesions in both lobes of the liver. A large geographic lesion in the superior right lobe measures nearly 11 cm in estimated diameter. Separate 2.7 cm and 5.5 cm left lobe lesions are also visualized. The very last image also shows probable periportal/peripancreatic lymphadenopathy. CT imaging of the abdomen and pelvis is recommended for further evaluation. No focal bony lesions are identified. There is spondylosis in the lower thoracic spine  Review of the MIP images confirms the above findings.  IMPRESSION: 1. Bilateral pulmonary embolism with nearly occlusive thrombus in the left lower lobe pulmonary artery and occlusive component of thrombus in the lateral basilar segmental branch of the right lower lobe. Nonocclusive thrombus is present in both upper lobes. No evidence of right heart strain by CTA. 2. There may be a component of interstitial edema. There also may be additional airspace disease  in the left lower lung and there is a moderate left pleural effusion. 3. Evidence of metastatic malignancy with innumerable bilateral small pulmonary nodules identified as well as multiple liver lesions in the visualized liver. This includes a nearly 11 cm mass in the right lobe of the liver. Etiology of malignancy is not clear and further evaluation of the abdomen and pelvis is recommended by CT with contrast.  Results were called by telephone at the time of interpretation on 09/08/2014 at 1:51 pm to Dr. LLuan Moore,  who verbally acknowledged these results.   Electronically Signed   By: Aletta Edouard M.D.   On: 09/08/2014 13:54   Ct Abdomen Pelvis W Contrast  09/10/2014   ADDENDUM REPORT: 09/10/2014 10:22  ADDENDUM: Low-density expansion of the pancreatic tail which could reflect a pancreas adenocarcinoma. Planned liver biopsy will likely clarify this finding.   Electronically Signed   By: Monte Fantasia M.D.   On: 09/10/2014 10:22   09/10/2014   CLINICAL DATA:  Question cancer.  Multiple myeloma.  EXAM: CT ABDOMEN AND PELVIS WITH CONTRAST  TECHNIQUE: Multidetector CT imaging of the abdomen and pelvis was performed using the standard protocol following bolus administration of intravenous contrast.  CONTRAST:  163m OMNIPAQUE IOHEXOL 300 MG/ML  SOLN  COMPARISON:  02/18/2014  FINDINGS: BODY WALL: Medication injection site in the left lower quadrant wall.  There is expansion of the subcutaneous fat and the left thigh muscles related to DVT. Bilateral DVT is visible, up to the common femoral veins.  LOWER CHEST: There is a small left pleural effusion which is stable from previous. No appreciable change in bilateral lower lobe pulmonary embolism and lung opacity.  There is a up to 28 mm lobulated mass along the right atrial wall, presumably a metastasis.  ABDOMEN/PELVIS:  Liver: There are numerous small hypo enhancing liver masses throughout the liver, with a confluent ill-defined mass in the right lobe  measuring 10 cm. The other largest mass is in the segment 2 at 5 cm.  Liver morphology suggests cirrhosis, likely present before the metastases given fissure enlargement.  Biliary: Cholelithiasis.  No evidence of obstruction.  Pancreas: Unremarkable.  Spleen: Unremarkable.  Adrenals: Unremarkable.  Kidneys and ureters: No hydronephrosis or stone.  Bladder: Unremarkable.  Reproductive: No pathologic findings.  Bowel: No obstruction. No gross mass lesion.  Retroperitoneum: Moderate enlargement of lymph nodes in the deep liver drainage, with the portacaval node measuring 14 x 28 mm.  Peritoneum: No ascites or pneumoperitoneum.  Vascular: No acute abnormality.  OSSEOUS: No metastasis identified. There is advanced and diffuse degenerative disc and facet disease status post extensive posterior spinal fusion from T12-L5. Prior sacral fusion has been removed. There is been multilevel discectomy, including at L5-S1 where there is pseudoarthrosis.  IMPRESSION: 1. Hepatic metastatic disease and hepatic drainage adenopathy. A primary malignancy is not definitively identified, but given cirrhosis and a dominant right liver mass, hepatocellular carcinoma is a strong consideration. 2. Right atrial filling defect, appearance and clinical circumstances favors metastasis over thrombus. Cardiac MRI could differentiate if clinically needed. 3. Known bilateral pulmonary embolism. There is DVT with clot into the bilateral common femoral veins. Venous obstructive changes noted in the left thigh.  Electronically Signed: By: JMonte FantasiaM.D. On: 09/09/2014 19:12    Labs:  CBC:  Recent Labs  09/07/14 0429 09/08/14 0530 09/09/14 0405 09/10/14 0524  WBC 14.7* 15.8* 17.4* 17.9*  HGB 12.3* 13.1 12.1* 13.2  HCT 38.4* 41.3 37.3* 40.1  PLT 231 197 217 261    COAGS:  Recent Labs  10/16/13 1318  09/07/14 0429 09/08/14 0530 09/09/14 0405 09/10/14 0524  INR 1.10  < > 3.01* 3.54* 2.97* 2.03*  APTT 28  --   --   --   --    --   < > = values in this interval not displayed.  BMP:  Recent Labs  09/07/14 0429 09/08/14 0530 09/09/14 0405 09/10/14 0524  NA 137 137 136 133*  K 3.6 4.0 3.8 4.9  CL 90* 87* 90*  90*  CO2 37* 39* 35* 31  GLUCOSE 130* 98 98 119*  BUN 16 16 23 19   CALCIUM 8.5 9.0 8.5 8.6  CREATININE 0.65 0.73 0.69 0.71  GFRNONAA >90 89* >90 >90  GFRAA >90 >90 >90 >90    LIVER FUNCTION TESTS:  Recent Labs  05/12/14 1705 09/06/2014 1029 09/04/14 0834  BILITOT 0.6 1.4* 1.2  AST 28 42* 30  ALT 16 15 13   ALKPHOS 118* 101 93  PROT 7.6 6.4 6.1  ALBUMIN 3.5 2.7* 2.3*    Assessment and Plan: PE Pleural effusion, for (L)thoracentesis today, mostly for therapeutic purposes but specimen also to go to lab. Multiple liver masses, concerning for metastatic process. Coagulopathy-Coumadin held, INR trending down, today 2.0 Can proceed with US guided liver biopsy when INR down, hopefully tomorrow. Risks and Benefits discussed with the patient including, but not limited to bleeding, infection, damage to adjacent structures or low yield requiring additional tests. All of the patient's questions were answered, patient is agreeable to proceed. Consent signed and in chart. NPO p MN     I spent a total of 20 minutes face to face in clinical consultation/evaluation, greater than 50% of which was counseling/coordinating care for liver biopsy.  SignedAscencion Dike 09/10/2014, 12:00 PM

## 2014-09-10 NOTE — Progress Notes (Signed)
  PROGRESS NOTE MEDICINE TEACHING ATTENDING   Day 7 of stay Patient name: Phillip Meyer   Medical record number: [id number] Date of birth: [birth year]    [name] complains of pain in his feet. He remains of 5L of oxygen.  Blood pressure 102/51, pulse 93, temperature 98.3 F (36.8 C), temperature source Oral, resp. rate 16, height 5\' 11"  (1.803 m), weight 295 lb 13.7 oz (134.2 kg), SpO2 97 %.  General: Sitting up in chair, no acute distress.  HEENT: PERRL, EOMI, no scleral icterus. Heart: Irregular rhythm, no tachycardia, no rubs, murmurs or gallops. Lungs: Crackles present, no wheezes, or rhonchi. Abdomen: Soft, nontender, nondistended, BS present. Extremities: Right foot warm, left foot cold and redness around the ankle seems to have increased, non tender, edema 2+ bilaterally. Neuro: Alert and oriented X3.   Today we proceed with thoracentesis - diagnostic and if possible therapeutic.   Work up for possible primary liver malignancy hepatitis panel, and AFp to be sent.   The patient has never had colonoscopy in the past, however, notes no change in BMs, no finding associated with colon cancer noted on CTabdpelv.   Liver biopsy likely tomorrow.   We will stop antibiotics and watch the patient off of them.   We will monitor WBC trend.   We will manage LLE symptoms conservatively.   Elevate pain control.   Afib and CHF - we continue to follow cardiology recommendations.   I have discussed the care of this patient with my IM team residents. Please see the resident note for details.  Madilyn Fireman 09/10/2014, 12:43 PM.

## 2014-09-10 NOTE — Progress Notes (Addendum)
Subjective:  Patient states that he is feeling slightly better today. He states that his tramadol has not been helping as much with his left foot pain.  Objective: Vital signs in last 24 hours: Filed Vitals:   09/10/14 0510 09/10/14 0540 09/10/14 0928 09/10/14 0930  BP: 109/53  122/68   Pulse: 83   93  Temp: 98.3 F (36.8 C)     TempSrc: Oral     Resp: 16     Height:      Weight:  295 lb 13.7 oz (134.2 kg)    SpO2: 97%      Weight change: 2 lb 3.3 oz (1 kg)  Intake/Output Summary (Last 24 hours) at 09/10/14 1021 Last data filed at 09/10/14 2423  Gross per 24 hour  Intake 767.28 ml  Output   2000 ml  Net -1232.72 ml    General: resting in chair, eating breakfast, nasal cannula in place, currently on 4.5 L nasal cannula Cardiac: Irregularly irregular, normal rate, no rubs, murmurs or gallops Pulm: Rales most remarkable in the left lung base, moving normal volumes of air Abd: soft, nontender, nondistended, BS present Ext: Left extremity colder than right, 2+ bilateral lower extremity edema, left lower extremity cold with cutaneous erythematous change overlying and superior to the ankle Neuro: alert and oriented X3, cranial nerves II-XII grossly intact Skin: no rashes or lesions noted Psych: appropriate affect and cognition  Lab Results: Basic Metabolic Panel:  Recent Labs Lab 08/24/2014 1029  09/09/14 0405 09/10/14 0524  NA 137  < > 136 133*  K 3.7  < > 3.8 4.9  CL 98  < > 90* 90*  CO2 30  < > 35* 31  GLUCOSE 155*  < > 98 119*  BUN 18  < > 23 19  CREATININE 0.90  < > 0.69 0.71  CALCIUM 8.4  < > 8.5 8.6  MG 2.2  --   --   --   < > = values in this interval not displayed. Liver Function Tests:  Recent Labs Lab 09/17/2014 1029 09/04/14 0834  AST 42* 30  ALT 15 13  ALKPHOS 101 93  BILITOT 1.4* 1.2  PROT 6.4 6.1  ALBUMIN 2.7* 2.3*   No results for input(s): LIPASE, AMYLASE in the last 168 hours. No results for input(s): AMMONIA in the last 168  hours. CBC:  Recent Labs Lab 09/09/14 0405 09/10/14 0524  WBC 17.4* 17.9*  HGB 12.1* 13.2  HCT 37.3* 40.1  MCV 94.7 93.9  PLT 217 261   Cardiac Enzymes:  Recent Labs Lab 09/05/2014 1029 09/05/2014 1846 09/08/2014 2131  TROPONINI 0.03 <0.03 0.03   BNP: No results for input(s): PROBNP in the last 168 hours. D-Dimer: No results for input(s): DDIMER in the last 168 hours. CBG:  Recent Labs Lab 09/09/14 0413 09/09/14 0741 09/09/14 1223 09/09/14 1704 09/09/14 2137 09/10/14 0738  GLUCAP 115* 105* 176* 129* 183* 118*   Coagulation:  Recent Labs Lab 09/07/14 0429 09/08/14 0530 09/09/14 0405 09/10/14 0524  LABPROT 31.5* 35.7* 31.1* 23.1*  INR 3.01* 3.54* 2.97* 2.03*    Micro Results: Recent Results (from the past 240 hour(s))  Culture, blood (routine x 2) Call MD if unable to obtain prior to antibiotics being given     Status: None   Collection Time: 08/31/2014  6:46 PM  Result Value Ref Range Status   Specimen Description BLOOD RIGHT ANTECUBITAL  Final   Special Requests BOTTLES DRAWN AEROBIC AND ANAEROBIC 5CC  Final  Culture   Final    NO GROWTH 5 DAYS Performed at [facility]    Report Status 09/10/2014 FINAL  Final  Culture, blood (routine x 2) Call MD if unable to obtain prior to antibiotics being given     Status: None   Collection Time: 09/06/2014  6:57 PM  Result Value Ref Range Status   Specimen Description BLOOD RIGHT HAND  Final   Special Requests BOTTLES DRAWN AEROBIC ONLY 3CC  Final   Culture   Final    NO GROWTH 5 DAYS Performed at [facility]    Report Status 09/10/2014 FINAL  Final   Studies/Results: Ct Angio Chest Pe W/cm &/or Wo Cm  09/08/2014   CLINICAL DATA:  Right lower extremity DVT, shortness of breath and hypoxia.  EXAM: CT ANGIOGRAPHY CHEST WITH CONTRAST  TECHNIQUE: Multidetector CT imaging of the chest was performed using the standard protocol during bolus administration of intravenous contrast. Multiplanar CT image  reconstructions and MIPs were obtained to evaluate the vascular anatomy.  CONTRAST:  64m OMNIPAQUE IOHEXOL 350 MG/ML SOLN  COMPARISON:  Chest x-ray on 09/07/2014  FINDINGS: There is evidence of bilateral pulmonary embolism. Nearly occlusive thrombus is identified in the left lower lobe pulmonary artery. Nonocclusive thrombus is identified in the right lower lobe pulmonary artery with some occlusive component extending into a lateral basilar subsegmental arterial branch. There is some small nonocclusive thrombus in the left upper lobe and right upper lobe. There is associated moderate left pleural effusion. There is airspace disease in the left lower lobe. Component of pneumonia cannot be excluded. Peripheral opacity in the lateral basilar right lower lobe measures roughly 2.0 x 2.7 cm this is in the distribution of occlusive pulmonary embolism and may represent a developing pulmonary infarct.  Innumerable small pulmonary nodules are seen throughout both lungs. The largest discrete nodule in the right lung is a 9 mm right middle lobe nodule. The largest nodule in the left lung is an 8 mm left upper lobe nodule. Masses could be obscured in the left lower lobe due to airspace disease and compressive atelectasis from the pleural effusion. No grossly enlarged lymph nodes are identified in the chest. Interstitial and pulmonary vascular prominence may be consistent with interstitial edema.  No pericardial fluid is seen. The heart size is normal. Extensive coronary arterial calcifications are seen in a 3 vessel distribution. No evidence of significant right heart strain related to pulmonary embolism with RV to LV diameter ratio of 0.86.  The visualized upper abdomen shows multiple lesions in both lobes of the liver. A large geographic lesion in the superior right lobe measures nearly 11 cm in estimated diameter. Separate 2.7 cm and 5.5 cm left lobe lesions are also visualized. The very last image also shows probable  periportal/peripancreatic lymphadenopathy. CT imaging of the abdomen and pelvis is recommended for further evaluation. No focal bony lesions are identified. There is spondylosis in the lower thoracic spine  Review of the MIP images confirms the above findings.  IMPRESSION: 1. Bilateral pulmonary embolism with nearly occlusive thrombus in the left lower lobe pulmonary artery and occlusive component of thrombus in the lateral basilar segmental branch of the right lower lobe. Nonocclusive thrombus is present in both upper lobes. No evidence of right heart strain by CTA. 2. There may be a component of interstitial edema. There also may be additional airspace disease in the left lower lung and there is a moderate left pleural effusion. 3. Evidence of metastatic malignancy with  innumerable bilateral small pulmonary nodules identified as well as multiple liver lesions in the visualized liver. This includes a nearly 11 cm mass in the right lobe of the liver. Etiology of malignancy is not clear and further evaluation of the abdomen and pelvis is recommended by CT with contrast.  Results were called by telephone at the time of interpretation on 09/08/2014 at 1:51 pm to Dr. Luan Moore , who verbally acknowledged these results.   Electronically Signed   By: Aletta Edouard M.D.   On: 09/08/2014 13:54   Ct Abdomen Pelvis W Contrast  09/09/2014   CLINICAL DATA:  Question cancer.  Multiple myeloma.  EXAM: CT ABDOMEN AND PELVIS WITH CONTRAST  TECHNIQUE: Multidetector CT imaging of the abdomen and pelvis was performed using the standard protocol following bolus administration of intravenous contrast.  CONTRAST:  142m OMNIPAQUE IOHEXOL 300 MG/ML  SOLN  COMPARISON:  02/18/2014  FINDINGS: BODY WALL: Medication injection site in the left lower quadrant wall.  There is expansion of the subcutaneous fat and the left thigh muscles related to DVT. Bilateral DVT is visible, up to the common femoral veins.  LOWER CHEST: There is a small  left pleural effusion which is stable from previous. No appreciable change in bilateral lower lobe pulmonary embolism and lung opacity.  There is a up to 28 mm lobulated mass along the right atrial wall, presumably a metastasis.  ABDOMEN/PELVIS:  Liver: There are numerous small hypo enhancing liver masses throughout the liver, with a confluent ill-defined mass in the right lobe measuring 10 cm. The other largest mass is in the segment 2 at 5 cm.  Liver morphology suggests cirrhosis, likely present before the metastases given fissure enlargement.  Biliary: Cholelithiasis.  No evidence of obstruction.  Pancreas: Unremarkable.  Spleen: Unremarkable.  Adrenals: Unremarkable.  Kidneys and ureters: No hydronephrosis or stone.  Bladder: Unremarkable.  Reproductive: No pathologic findings.  Bowel: No obstruction. No gross mass lesion.  Retroperitoneum: Moderate enlargement of lymph nodes in the deep liver drainage, with the portacaval node measuring 14 x 28 mm.  Peritoneum: No ascites or pneumoperitoneum.  Vascular: No acute abnormality.  OSSEOUS: No metastasis identified. There is advanced and diffuse degenerative disc and facet disease status post extensive posterior spinal fusion from T12-L5. Prior sacral fusion has been removed. There is been multilevel discectomy, including at L5-S1 where there is pseudoarthrosis.  IMPRESSION: 1. Hepatic metastatic disease and hepatic drainage adenopathy. A primary malignancy is not definitively identified, but given cirrhosis and a dominant right liver mass, hepatocellular carcinoma is a strong consideration. 2. Right atrial filling defect, appearance and clinical circumstances favors metastasis over thrombus. Cardiac MRI could differentiate if clinically needed. 3. Known bilateral pulmonary embolism. There is DVT with clot into the bilateral common femoral veins. Venous obstructive changes noted in the left thigh.   Electronically Signed   By: JMonte FantasiaM.D.   On: 09/09/2014  19:12   Medications: I have reviewed the patient's current medications. Scheduled Meds: . antiseptic oral rinse  7 mL Mouth Rinse BID  . colchicine  0.6 mg Oral Daily  . dextromethorphan-guaiFENesin  1 tablet Oral BID  . diltiazem  30 mg Oral 4 times per day  . docusate sodium  200 mg Oral Daily  . ferrous sulfate  325 mg Oral Q breakfast  . furosemide  80 mg Intravenous BID  . gabapentin  600 mg Oral BID  . insulin aspart  0-15 Units Subcutaneous TID WC  . insulin aspart  0-5 Units  Subcutaneous QHS  . insulin aspart  10 Units Subcutaneous TID WC  . insulin glargine  30 Units Subcutaneous QHS  . metoCLOPramide  5 mg Oral TID AC  . metoprolol tartrate  25 mg Oral BID  . multivitamin with minerals  1 tablet Oral Daily  . rOPINIRole  2 mg Oral BID  . simvastatin  10 mg Oral QHS  . sodium chloride  3 mL Intravenous Q12H  . sotalol  80 mg Oral BID   Continuous Infusions: . heparin 1,900 Units/hr (09/09/14 2353)   PRN Meds:.ipratropium-albuterol, morphine injection, sodium chloride, traMADol Assessment/Plan: Active Problems:   IDDM (insulin dependent diabetes mellitus)   CAP (community acquired pneumonia)   HLD (hyperlipidemia)   BP (high blood pressure)   Gout of ankle   Atrial fibrillation   Pulmonary embolus   Disseminated malignancy of unknown primary  Patient is a 73 year old male with a history of type 2 diabetes, hypertension, congestive heart failure, hyperlipidemia, gout admitted for community-acquired pneumonia whose hospital course has been complicated by persistent hypoxia found to be secondary to pulmonary embolism in the setting of likely extensive metastatic disease.  Metastatic cancer of uncertain primary: Patient was found to have innumerable bilateral small pulmonary nodules as well as multiple liver lesions the largest of which measuring 11 cm in diameter in the right lobe of the liver. There is some suspicion for a primary hepatocellular carcinoma given  concurrent cirrhosis although there is also a lesion in the tail the pancreas which is concerning for a primary pancreatic cancer. Patient also found to have a left moderate pleural effusion in addition to a right atrial filling defect which has unknown association with possible malignancy at this point. Given the pattern of metastases, probably favor a liver or GI primary as there is no lung primary that is readily apparent on imaging.  -Diagnostic and therapeutic thoracentesis by interventional radiology today as patient's INR is 2.03. -Plan for liver biopsy tomorrow as target INR is 1.5 in the setting of a higher risk procedure. -Coumadin discontinued. -Unfractionated heparin in the setting of malignancy and possible invasive procedures for tissue diagnosis. In the longer term, patient does not have any renal insufficiency so he may be a good candidate for low molecular weight heparin therapy after invasive procedures. -AFP -Hepatitis panel  Bilateral pulmonary embolism: Patient with very slightly decreased oxygen requirement at 4.5 L nasal cannula today. Nearly occlusive thrombus in the left lower lobe pulmonary artery as well as several other thrombi throughout bilateral lungs. No evidence of right heart strain by CTA. Echocardiogram also corroborates this with no evidence of right heart strain. Patient has maintain adequate blood pressures in the last several days, which argues against any intra-arterial intervention at this point. Patient has maintained a therapeutic INR on Coumadin throughout his hospitalization. It is not entirely clear whether this may represent a overt failure on Coumadin or if the pulmonary emboli may have preceded her current INR measurements. In either case, therapy with heparin is preferred in the setting of malignancy. -Unfractionated heparin  Community-acquired pneumonia: Likely resolved. Patient with an uptrending white count although there are several other factors that  are possibly contributory to this including extensive thromboembolic events as well as malignancy. Patient has completed a 5 day course of ceftriaxone and azithromycin for community-acquired pneumonia in addition to 2 days of vancomycin and cefepime. -Discontinue vancomycin and cefepime. -Continue home tramadol for chest and abdominal discomfort. -Mucinex for cough -Nasal saline spray for nasal congestion. -Incentive  spirometry, flutter valve, and DuoNeb when necessary  Lower extremity vascular disease: Patient found to have a subacute DVT in the right lower extremity on 09/06/2014. Patient also with pain and with overlying erythema in the left lower extremity that had been unresponsive to antibiotics making it or likely to be secondary to chronic venous insufficiency versus superficial thrombophlebitis. Vascular surgery has evaluated, though patient is a poor surgical candidate given hypercoagulability and malignancy. -Unfractionated heparin -Tramadol and morphine for pain -Elevate legs -TED hose if able to tolerate -Warm compresses  Paroxysmal atrial fibrillation:  Patient has remained in atrial fibrillation throughout this admission. Patient has remained in the 80s to low 100s. There is some concern in having patient can currently on metoprolol and sotalol. Patient will need a cardioversion likely back at [facility] once there is resolution of the pneumonia. -Appreciate cardiology recommendations -Diltiazem 30 mg every 6 hours -Metoprolol has been titrated up to 25 mg twice a day -Continue home sotalol to help with sinus rhythm once reestablished.  -Continue with anticoagulation with Coumadin.   Systolic congestive heart failure:  Patient has continued to diuresis with around -12.7 net negative over this hospitalization. However, patient still exhibiting signs of fluid overload clinically. Repeat echocardiogram from yesterday showing mildly reduced systolic function of 69-67%. -Lasix 80 mg  twice a day IV. -Appreciate cardiology recommendations.  Hypertension:  Patient has been able to sustain satisfactory blood pressures in the low 893Y systolic over the last 24 hours. At home, patient is on diltiazem 120 mg daily, enalapril 20 mg daily, Lasix reportedly up to 200 mg daily, sotalol 80 mg twice a day.  -Continue to hold home enalapril given low normal blood pressures  -Blood pressure management as above with paroxysmal atrial fibrillation.  Type two diabetes:  Patient with no recurrent episodes of hypoglycemia over the last 24 hours. Otherwise, patient has had satisfactory blood glucose control.  -Decreased lantus to 30 units daily  -Continue NovoLog 10 3 times a day before meals  -Moderate sliding scale insulin with nighttime correction   Gout:  Patient with stable pain in his left ankle. Patient currently on colchicine 0.6 mg 4 times a day. It is noted that there is a interaction with diltiazem. -Continue home colchicine at a lower dose of 0.6 mg daily.  Iron deficiency anemia: Patient is on 325 mg ferrous sulfate daily.  -Continue home ferrous sulfate.   Neuropathic pain: Patient is on gabapentin 600 mg twice a day.  -Continue home gabapentin   Diet: Carbohydrate modified  Prophylaxis: Patient currently on Coumadin and therapeutic  Code status: DNR/DNI, POA is son, Dereke Neumann cell [phone])  Dispo: Disposition is deferred at this time, awaiting improvement of current medical problems. Anticipated discharge in approximately 2 day(s).   The patient does have a current PCP Zorita Pang, MD) and does need an [facility] hospital follow-up appointment after discharge.  The patient does not have transportation limitations that hinder transportation to clinic appointments.    LOS: 7 days   Services Needed at time of discharge: Y = Yes, Blank = No PT:   OT:   RN:   Equipment:   Other:    Luan Moore, MD 09/10/2014, 10:21 AM

## 2014-09-10 NOTE — Progress Notes (Addendum)
ANTICOAGULATION CONSULT NOTE - Follow Up Consult  Pharmacy Consult:  Heparin Indication:  History of AFib and DVT + new PEs and DVTs  Allergies  Allergen Reactions  . Elavil [Amitriptyline] Other (See Comments)    Passes out    Patient Measurements: Height: 5\' 11"  (180.3 cm) Weight: 295 lb 13.7 oz (134.2 kg) IBW/kg (Calculated) : 75.3 Heparin Dosing Weight: 105 kg  Vital Signs: Temp: 98.3 F (36.8 C) (03/23 0510) Temp Source: Oral (03/23 0510) BP: 122/68 mmHg (03/23 0928) Pulse Rate: 93 (03/23 0930)  Labs:  Recent Labs  09/08/14 0530  09/09/14 0405 09/09/14 1338 09/09/14 2018 09/10/14 0524 09/10/14 0855  HGB 13.1  --  12.1*  --   --  13.2  --   HCT 41.3  --  37.3*  --   --  40.1  --   PLT 197  --  217  --   --  261  --   LABPROT 35.7*  --  31.1*  --   --  23.1*  --   INR 3.54*  --  2.97*  --   --  2.03*  --   HEPARINUNFRC  --   < > 0.24* 0.43 0.53  --  1.06*  CREATININE 0.73  --  0.69  --   --  0.71  --   < > = values in this interval not displayed.  Estimated Creatinine Clearance: 113.3 mL/min (by C-G formula based on Cr of 0.71).  Assessment: 37 YOM on Coumadin PTA for history of Afib and DVT in November of 2015.  Patient's INRs have been therapeutic or supra-therapeutic and found to have new PEs and DVTs that extended throughout the left leg.  Currently on IV heparin and heparin level is 1.06 on 1900 units/hr.  No bleeding reported. HL is higher than goal. When I went into pt's room to investigate heparin level draw, the lab tech was in his room getting AFT and hepatitis panel so I asked her to repeat the HL.  It appears the first HL was drawn correctly in the hand of the arm opposite the heparin drip.  Pt with multiple liver lesions of unknown source.  One possibility is hepatocellular carcinoma.  To have thoracentesis of pleural effusion today per IR.  Plan for liver bx tomorrow when INR < = 1.5.    Goal of Therapy:  Heparin level 0.3-0.7 units/ml Monitor  platelets by anticoagulation protocol: Yes    Plan:  -repeat HL now -f/u anticoagulation around thoracentesis in IR today - Continue heparin gtt at 1900 units/hr - Daily HL / CBC  Eudelia Bunch, Pharm.D. [phone] 09/10/2014 10:40 AM  Repeat HL therapeutic at 0.36 while heparin was running at 1900 units/hr.  S/p thoracentesis today while heparin drip infusing with yield of 430 ml of bloody pleural fluid  Plan: continue heparin drip at 1900 units/hr F/u for plans for liver biopsy and holding of heparin drip for procedure  Eudelia Bunch, Pharm.D. [phone] 09/10/2014 2:18 PM

## 2014-09-10 NOTE — Progress Notes (Signed)
Subjective: Only complains of rt foot pain  Objective: Vital signs in last 24 hours: Temp:  [98.2 F (36.8 C)-98.3 F (36.8 C)] 98.3 F (36.8 C) (03/23 0510) Pulse Rate:  [81-106] 93 (03/23 0930) Resp:  [16-20] 16 (03/23 0510) BP: (97-122)/(51-68) 102/51 mmHg (03/23 1145) SpO2:  [94 %-98 %] 97 % (03/23 0510) Weight:  [295 lb 13.7 oz (134.2 kg)] 295 lb 13.7 oz (134.2 kg) (03/23 0540) Weight change: 2 lb 3.3 oz (1 kg) Last BM Date: 09/06/14 Intake/Output from previous day: -1232 wt up 295.13 03/22 0701 - 03/23 0700 In: 767.3 [P.O.:120; I.V.:447.3; IV Piggyback:200] Out: 2000 [Urine:2000] Intake/Output this shift:    PE: General:Pleasant affect, NAD Skin:Warm and dry, brisk capillary refill HEENT:normocephalic, sclera clear, mucus membranes moist Heart:S1S2 RRR without murmur, gallup, rub or click Lungs:clear ant. without rales, rhonchi, or wheezes VPX:TGGY, non tender, + BS, do not palpate liver spleen or masses Ext:2-3+ lower ext edema, 2+ radial pulses Neuro:alert and oriented, MAE, follows commands, + facial symmetry Tele: a fib but rate with improved control + PVCS   Lab Results:  Recent Labs  09/09/14 0405 09/10/14 0524  WBC 17.4* 17.9*  HGB 12.1* 13.2  HCT 37.3* 40.1  PLT 217 261   BMET  Recent Labs  09/09/14 0405 09/10/14 0524  NA 136 133*  K 3.8 4.9  CL 90* 90*  CO2 35* 31  GLUCOSE 98 119*  BUN 23 19  CREATININE 0.69 0.71  CALCIUM 8.5 8.6     Studies/Results: Ct Angio Chest Pe W/cm &/or Wo Cm  09/08/2014   CLINICAL DATA:  Right lower extremity DVT, shortness of breath and hypoxia.  EXAM: CT ANGIOGRAPHY CHEST WITH CONTRAST  TECHNIQUE: Multidetector CT imaging of the chest was performed using the standard protocol during bolus administration of intravenous contrast. Multiplanar CT image reconstructions and MIPs were obtained to evaluate the vascular anatomy.  CONTRAST:  69m OMNIPAQUE IOHEXOL 350 MG/ML SOLN  COMPARISON:  Chest x-ray  on 09/07/2014  FINDINGS: There is evidence of bilateral pulmonary embolism. Nearly occlusive thrombus is identified in the left lower lobe pulmonary artery. Nonocclusive thrombus is identified in the right lower lobe pulmonary artery with some occlusive component extending into a lateral basilar subsegmental arterial branch. There is some small nonocclusive thrombus in the left upper lobe and right upper lobe. There is associated moderate left pleural effusion. There is airspace disease in the left lower lobe. Component of pneumonia cannot be excluded. Peripheral opacity in the lateral basilar right lower lobe measures roughly 2.0 x 2.7 cm this is in the distribution of occlusive pulmonary embolism and may represent a developing pulmonary infarct.  Innumerable small pulmonary nodules are seen throughout both lungs. The largest discrete nodule in the right lung is a 9 mm right middle lobe nodule. The largest nodule in the left lung is an 8 mm left upper lobe nodule. Masses could be obscured in the left lower lobe due to airspace disease and compressive atelectasis from the pleural effusion. No grossly enlarged lymph nodes are identified in the chest. Interstitial and pulmonary vascular prominence may be consistent with interstitial edema.  No pericardial fluid is seen. The heart size is normal. Extensive coronary arterial calcifications are seen in a 3 vessel distribution. No evidence of significant right heart strain related to pulmonary embolism with RV to LV diameter ratio of 0.86.  The visualized upper abdomen shows multiple lesions in both lobes of the liver. A large geographic lesion  in the superior right lobe measures nearly 11 cm in estimated diameter. Separate 2.7 cm and 5.5 cm left lobe lesions are also visualized. The very last image also shows probable periportal/peripancreatic lymphadenopathy. CT imaging of the abdomen and pelvis is recommended for further evaluation. No focal bony lesions are  identified. There is spondylosis in the lower thoracic spine  Review of the MIP images confirms the above findings.  IMPRESSION: 1. Bilateral pulmonary embolism with nearly occlusive thrombus in the left lower lobe pulmonary artery and occlusive component of thrombus in the lateral basilar segmental branch of the right lower lobe. Nonocclusive thrombus is present in both upper lobes. No evidence of right heart strain by CTA. 2. There may be a component of interstitial edema. There also may be additional airspace disease in the left lower lung and there is a moderate left pleural effusion. 3. Evidence of metastatic malignancy with innumerable bilateral small pulmonary nodules identified as well as multiple liver lesions in the visualized liver. This includes a nearly 11 cm mass in the right lobe of the liver. Etiology of malignancy is not clear and further evaluation of the abdomen and pelvis is recommended by CT with contrast.  Results were called by telephone at the time of interpretation on 09/08/2014 at 1:51 pm to Dr. Luan Moore , who verbally acknowledged these results.   Electronically Signed   By: Aletta Edouard M.D.   On: 09/08/2014 13:54   Ct Abdomen Pelvis W Contrast  09/10/2014   ADDENDUM REPORT: 09/10/2014 10:22  ADDENDUM: Low-density expansion of the pancreatic tail which could reflect a pancreas adenocarcinoma. Planned liver biopsy will likely clarify this finding.   Electronically Signed   By: Monte Fantasia M.D.   On: 09/10/2014 10:22   09/10/2014   CLINICAL DATA:  Question cancer.  Multiple myeloma.  EXAM: CT ABDOMEN AND PELVIS WITH CONTRAST  TECHNIQUE: Multidetector CT imaging of the abdomen and pelvis was performed using the standard protocol following bolus administration of intravenous contrast.  CONTRAST:  151m OMNIPAQUE IOHEXOL 300 MG/ML  SOLN  COMPARISON:  02/18/2014  FINDINGS: BODY WALL: Medication injection site in the left lower quadrant wall.  There is expansion of the subcutaneous  fat and the left thigh muscles related to DVT. Bilateral DVT is visible, up to the common femoral veins.  LOWER CHEST: There is a small left pleural effusion which is stable from previous. No appreciable change in bilateral lower lobe pulmonary embolism and lung opacity.  There is a up to 28 mm lobulated mass along the right atrial wall, presumably a metastasis.  ABDOMEN/PELVIS:  Liver: There are numerous small hypo enhancing liver masses throughout the liver, with a confluent ill-defined mass in the right lobe measuring 10 cm. The other largest mass is in the segment 2 at 5 cm.  Liver morphology suggests cirrhosis, likely present before the metastases given fissure enlargement.  Biliary: Cholelithiasis.  No evidence of obstruction.  Pancreas: Unremarkable.  Spleen: Unremarkable.  Adrenals: Unremarkable.  Kidneys and ureters: No hydronephrosis or stone.  Bladder: Unremarkable.  Reproductive: No pathologic findings.  Bowel: No obstruction. No gross mass lesion.  Retroperitoneum: Moderate enlargement of lymph nodes in the deep liver drainage, with the portacaval node measuring 14 x 28 mm.  Peritoneum: No ascites or pneumoperitoneum.  Vascular: No acute abnormality.  OSSEOUS: No metastasis identified. There is advanced and diffuse degenerative disc and facet disease status post extensive posterior spinal fusion from T12-L5. Prior sacral fusion has been removed. There is been multilevel discectomy,  including at L5-S1 where there is pseudoarthrosis.  IMPRESSION: 1. Hepatic metastatic disease and hepatic drainage adenopathy. A primary malignancy is not definitively identified, but given cirrhosis and a dominant right liver mass, hepatocellular carcinoma is a strong consideration. 2. Right atrial filling defect, appearance and clinical circumstances favors metastasis over thrombus. Cardiac MRI could differentiate if clinically needed. 3. Known bilateral pulmonary embolism. There is DVT with clot into the bilateral common  femoral veins. Venous obstructive changes noted in the left thigh.  Electronically Signed: By: Monte Fantasia M.D. On: 09/09/2014 19:12    Medications: I have reviewed the patient's current medications. Scheduled Meds: . antiseptic oral rinse  7 mL Mouth Rinse BID  . colchicine  0.6 mg Oral Daily  . dextromethorphan-guaiFENesin  1 tablet Oral BID  . diltiazem  30 mg Oral 4 times per day  . docusate sodium  200 mg Oral Daily  . ferrous sulfate  325 mg Oral Q breakfast  . furosemide  80 mg Intravenous BID  . gabapentin  600 mg Oral BID  . insulin aspart  0-15 Units Subcutaneous TID WC  . insulin aspart  0-5 Units Subcutaneous QHS  . insulin aspart  10 Units Subcutaneous TID WC  . insulin glargine  30 Units Subcutaneous QHS  . lidocaine (PF)      . metoCLOPramide  5 mg Oral TID AC  . metoprolol tartrate  25 mg Oral BID  . multivitamin with minerals  1 tablet Oral Daily  . rOPINIRole  2 mg Oral BID  . simvastatin  10 mg Oral QHS  . sodium chloride  3 mL Intravenous Q12H  . sotalol  80 mg Oral BID   Continuous Infusions: . heparin 1,900 Units/hr (09/09/14 2353)   PRN Meds:.ipratropium-albuterol, morphine injection, sodium chloride, traMADol  Assessment/Plan: 74 year old male with past medical history of PAF on coumadin and sotalol, nonischemic cardiomyopathy ED 21-19%, chronic systolic/diastolic CHF, diabetes mellitus, hypertension admitted to [facility] on 3/16 with ? PNA. Now diagnosed with DVT and bilateral pulmonary emboli. Findings on CT consistent with metastatic malignancy. Cardiology consulted for evaluation of atrial fibrillation and acute on chronic CHF.   Paroxysmal atrial fibrillation-the patient has recurrent atrial fibrillation in setting of acute pulmonary embolus. In reviewing his notes from [facility] he was seen in clinic in January 2016 and appeared to have been in sinus at that time. He has been in persistant afib since admission  -- On chronic coumadin. CHADS  vasc is 3.  -- Elevated rates initially likely driven by his PE  -- Currently on oral dilt 61m qid, sotalol 861mbid, and coumadin. His HRs have been labile. Will add low dose metoprolol for additional rate control. Now in the 80s -- Not a good candidate for DCCV at this point with multiple stressors and increased risk of anesthesia with new PE. Will focus on rate control now. Patient may convert on Sotalol once stress of acute PE improves.   Nonischemic cardiomyopathy- EF 40-45% -- He was on an ACE inhibitor at the time of admission. This is being held as his blood pressure is borderline. Would resume at lower dose as blood pressure improves- but not today.  Chronic combined systolic/diastolic congestive heart failure-  [facility]inate diastolic function but evidence of elevated LA pressures, borderline reduced RV function. Echo here is similar with EF 45%. RV function reported as normal.  -- CXR with pneumonia, mild congestion thought likely to be reactive. Pt had BNP mildly  elevated at 270.5 on admission -- He takes 200 mg of lasix at home on a daily basis, lasix have been on hold on admission due to low blood pressure. He was stated on IV lasix 80 mg BIV on Friday 09/05/14 then on Sunday 0/20/16 his home regiment was restarted with lasix 80 mg in AM and 120 mg in PM -- Net output of 11.4 liters since admission. Weight actually up today 293 lbs despite good diuresis.  -- Home enalapril on hold with soft BPs -- Will reduce IV lasix frequency due to low BP. I suspect a lot of his edema is due to DVT and thrombophilia. -thoracentesis today with 430 ml removed - fluid sent for labs  Bilateral lower extremity subacute DVT: preliminary report on LE dopplers on 08/1914 shows: subacute DVT noted in the right common femoral, proximal femoral, and left common femoral veins. Pt have been on Coumadin since home and INR remains therapeutic.   -- Continue  Coumadin once liver biopsy complete. Now on heparin.   Acute pulmonary embolus. Anticoagulation per primary team.  Nearly occlusive thrombus in the left lower lobe pulmonary artery as well as several other thrombi throughout bilateral lungs. No evidence of right heart strain by CTA.  NSVT- tele with afib with frequent PVCs and runs of NSVT (3-4 beat runs), today is same.  Malignancy of unknown primary with metastasis. Work up per primary team.   Lower ext arterial dopplers difficult to obtain with edema but mild PAD stenosis on Lt -he has Lt foot pain and foot cool  on Neurontin.  Vascular following--After revewing his CT and supratherapeutic INR, this patient's presentation is highly suspicious for neoplasm related thrombophilia. - In this setting, any revascularization procedure is likely futile, as combined venous and arterial thrombosis is a presentation of end-stage of the neoplasm. - Additionally, this patient's history is not consistent with acute arterial occlusion given the relative lack of sx. His exam is of limited value due to pre-existing neurologic loss in that L foot.  CAP- resolving per IM on ABX completed course    LOS: 7 days   Time spent with pt. :15 minutes. [facility] R  Nurse Practitioner Certified Pager [phone] or after 5pm and on weekends call [phone] 09/10/2014, 12:42 PM  Patient seen, examined. Available data reviewed. Agree with findings, assessment, and plan as outlined by Cecilie Kicks, NP. Exam reveals plasant elderly male in NAD. Lungs diminished on the left > R. Heart irregularly irregular. There is 3+ pretibial edema. Would continue IV lasix today. Will follow-up in am. Think rate control of atrial fibrillation will be best management (rate currently in the 80's). Cardiac meds reviewed and no changes today.  Reviewed CT and echo findings with patient and family. Plans for liver bx tomorrow. Appears to have widely metastatic disease as well as  DVT/PE.  Sherren Mocha, M.D. 09/10/2014 2:00 PM

## 2014-09-10 NOTE — Progress Notes (Signed)
LOS: 7 days   Subjective:  Pt states, that he is feeling a little bit better. He still has mild pain to the left ankle area.   Objective: BP 122/68 mmHg  Pulse 93  Temp(Src) 98.3 F (36.8 C) (Oral)  Resp 16  Ht 5\' 11"  (1.803 m)  Wt 134.2 kg (295 lb 13.7 oz)  BMI 41.28 kg/m2  SpO2 97%  Intake/Output Summary (Last 24 hours) at 09/10/14 0943 Last data filed at 09/10/14 1610  Gross per 24 hour  Intake 767.28 ml  Output   2000 ml  Net -1232.72 ml    Physical Exam: General: WDWN, healthy, cooperative, awake and alert, No acute distress. Sitting on his recliner. O2 vial nasal canula at 4.5l/min Skin: Skin color, texture, turgor normal. No rashes or lesions. HEENT:  Head : Atraumatic, normocephalic. Eye: PERRL, sclera anicteric, no conjunctival injection Nose: no nasal congestion.patent, no discharge Throat: No tonsillar erythema, exudates or enlargement. Mouth: Moist mucus, no lesions, dentition good. Neck: supple, no LAD Heart: Pulse regular rate and rhythm, normal S1S2, no murmurs appreciated.  Chest/ Lung: Normal depth and effort.  No tenderness to palpation Lungs sounds:Diffuse crackles bilaterally, lung sounds diminished to left side.   No rales, rhonchi, wheezing, or rubs. Abdomen: Soft, NTND, +BS, no hepatosplenomegaly noted.  Extremities: LE edema + 2 Right >> left . Left lower extremity cold and red around the ankle.  Neuro: CN III-XII grossly intact, sensation intact, spontaneous movement of all limbs, no obvious deficits  Labs/Studies: I have reviewed labs and studies from last 24hrs per EMR.  Medications: I have reviewed the patient's current medications.  Assessment/Plan: Active Problems:   IDDM (insulin dependent diabetes mellitus)   CAP (community acquired pneumonia)   HLD (hyperlipidemia)   BP (high blood pressure)   Gout of ankle   Atrial fibrillation   Pulmonary embolus   Disseminated malignancy of unknown primary     Phillip Meyer is  a 74 yo male with an history of type 2 diabetes, hypertension, congestive heart failure, hyperlipidemia, A-fib, and gout who presents with a productive cough and bilateral chest pain on inspiration have been treated for CAP with IV antibiotics for 7 days with no significant improvement of symptoms, CTA chest done on 09/08/14 was positive for bilateral PE and great suspicion for metastatic lung cancer. Pt started on IV unfractionate heparin. Antibiotherapy discontinued on 09/10/14.    # Possible metastatic lung cancer with left pleural effusion: CTA chest done on 09/08/14 reports "Evidence of metastatic malignancy with innumerable bilateral small pulmonary nodules as well as multiples liver lesions ". Unclear of the primary cancer location at this time. CT abdomen and pelvis done on 09/09/14 was not definite for identifying the primary cancer but with strong consideration for hepatocellular carcinoma. It also showed right atrial filling defect concerning for thrombus or metastasis. Pt INR today is 2.03, making the possibility of thoracocentesis feasible. -Thoracocentesis of pleural effusion today per IR for diagnostic and therapy.   -plan for Liver biopsy when INR at required goal of 1.5 probably tomorrow.  -CT abdomen and pelvis -continuous o2 sat -O 2 supplementation. -Duoneb PRN -incentive spirometry. -Flutter valve -Mucinex BID.  # Bilateral PE:  Pt admitted with dyspnea and cough  thought to be due to CAP as suggested by CXR and elevated WBC. CTA chest performed on 09/08/14 showed bilateral PE and  Evidence of metastatic malignancy with innumerable bilateral small pulmonary nodules as well as multiples liver lesions. Of note, pt have also developed  bilateral DVT.  Pt is obviously in a hypercoagulability state more likely related to a malignancy process. Pt was already on Coumadin prior to this admission( for A-fib) and his INR remained therapeutic throughout this admission. It is recommended in the  setting of malignancy to use unfractionated heparin notably in anticipation for invasive diagnostic procedure and for better management during therapy. -unfractionated heparin. -continuous o2 sat -Oxygen supplementation as needed.  # Bilateral LE DVT with left ankle redness: pt developed bilateral DVT as demonstrated by LE duplex done on 09/06/14. Of note, pt have been on his home coumadin since admission and have been therapeutic. This gives additional concern for hypercoagulability related to malignancy. Pt also concern for LLE coldness and reduction on ABI to 0.87 from 1.2 (study done 02/27/2013). He also has some redness to the LLE notably to the ankle area. Vascular surgeon was consulted and stated that the pt  ABI represents mild arterial disease  and doesn't require vascular surgery intervention at this time.   -Continue unfractionated heparin. -Q4 neurovascular check. -continue monitoring.  -TED hose to LE -Warm compresses.   # CAP: Resolved, pt has completed a 5 days of IV antibiotics  with Ceftriaxone and Zithromax , then a 2 day course of  Vancomycin and cefepime.  CTA performed on 09/08/14 showed bilateral PE and airspace disease in the left lower lobe and a moderate left pleural effusion. At this points, not sure if lung findings were due to pneumonia or  cancer process. Arguing for the pneumonia was the elevated procalcitonin at 0.41 as a marker of inflammatory response indicative of bacterial involvement and elevated WBC of 19.2 on presentation. Antibiotherapy will be discontinued at this point.  - d/c Vancomycin and Cefepime.  # Atrial fibrillation :pt has chronic A-fib and he takes Cardizem 120 mg daily, Sotalol 80 mg BID at home.. Pt home Sotalol was restarted on 09/04/14 . Metoprolol was also added for better rate control .  - appreciate cardiology consult.  -Metoprolol 25 mg BID  - Continue home Sotalol -Continue Cardizem 30 mg Q6 h.  -monitor on telemetry  -coumadin  stopped ( pt on Heparin drip)    # Congestive heart failure. Pt CTA showed a component of interstitial edema, pt still have LE edema and lung crackles on auscultation. LE edema could be explained by the DVT. Lung crackles findings could be attributed to his metastatic lung cancer and or pneumonia.  - Continue lasix 80 mg BID.  -continue home diltiazem -hold home enalapril d/t BP still soft.  -strict I & O -Daily weight -fluid restriction.   # Diabetes type 2 : Pt home medication consists on Insuline Lantus 60 U at bed time and Novolin 30 U TID. He takes Gabapentin for his neuropathy and Reglan for gastroparesis. Pt had a couple of episode of hypoglycemia during this admission. Last one was on 09/07/14 at 49. She was already on a lower dose of basal insuline( 45 U at bedtime) and meal coverage Novolin 15 TID. This was reduced accordingly.Last CBG= 118.  - continue sensitive SSI - Lantus at 30 U at bedtime - Novolog 10 U TID - gabapentin 600mg  QID - Reglan 5 mg before meals.   # Gout:  - continue home Colchicine  # Hypertension: pt is normotensive for the most part. BP: 109/53-114/66 -Continue Diltiazem 30 mg Q 6 h -Continue home Sotalol -Start lasix IV 80 mg BID   # Hyperlipidemia :  -Continue home Zocor 10 mg.    PPx: IV  heparin   Diet: Carb modified diet  Dispo: Disposition is deferred at this time, awaiting improvement of current medical problems. Anticipated discharge in approximately 2 day(s).   This is a Careers information officer Note.  The care of the patient was discussed with Dr. Raelene Bott and the assessment and plan formulated with their assistance.  Please see their attached note for official documentation of the daily encounter.

## 2014-09-10 NOTE — Procedures (Signed)
Successful US guided left thoracentesis. Yielded 434mL of bloody pleural fluid. Pt tolerated procedure well. No immediate complications.  Specimen was sent for labs. CXR ordered.  Ascencion Dike PA-C 09/10/2014 11:58 AM

## 2014-09-10 NOTE — Progress Notes (Signed)
PT Cancellation Note  Patient Details Name: Phillip Meyer MRN: [id number] DOB: [birth year]   Cancelled Treatment:    Reason Eval/Treat Not Completed: Medical issues which prohibited therapy   [name] 09/10/2014, 2:27 PM

## 2014-09-11 DIAGNOSIS — C799 Secondary malignant neoplasm of unspecified site: Secondary | ICD-10-CM

## 2014-09-11 LAB — HEPARIN LEVEL (UNFRACTIONATED): Heparin Unfractionated: 0.54 IU/mL (ref 0.30–0.70)

## 2014-09-11 LAB — CBC
HEMATOCRIT: 38.7 % — AB (ref 39.0–52.0)
Hemoglobin: 12.2 g/dL — ABNORMAL LOW (ref 13.0–17.0)
MCH: 30.2 pg (ref 26.0–34.0)
MCHC: 31.5 g/dL (ref 30.0–36.0)
MCV: 95.8 fL (ref 78.0–100.0)
Platelets: 302 10*3/uL (ref 150–400)
RBC: 4.04 MIL/uL — ABNORMAL LOW (ref 4.22–5.81)
RDW: 14.2 % (ref 11.5–15.5)
WBC: 16.9 10*3/uL — ABNORMAL HIGH (ref 4.0–10.5)

## 2014-09-11 LAB — GLUCOSE, CAPILLARY
GLUCOSE-CAPILLARY: 82 mg/dL (ref 70–99)
GLUCOSE-CAPILLARY: 225 mg/dL — AB (ref 70–99)
Glucose-Capillary: 130 mg/dL — ABNORMAL HIGH (ref 70–99)
Glucose-Capillary: 213 mg/dL — ABNORMAL HIGH (ref 70–99)

## 2014-09-11 LAB — BASIC METABOLIC PANEL
Anion gap: 8 (ref 5–15)
BUN: 23 mg/dL (ref 6–23)
CALCIUM: 8.3 mg/dL — AB (ref 8.4–10.5)
CHLORIDE: 90 mmol/L — AB (ref 96–112)
CO2: 36 mmol/L — AB (ref 19–32)
Creatinine, Ser: 0.64 mg/dL (ref 0.50–1.35)
GFR calc Af Amer: >90 mL/min (ref >90)
GFR calc non Af Amer: >90 mL/min (ref >90)
GLUCOSE: 86 mg/dL (ref 70–99)
POTASSIUM: 4.1 mmol/L (ref 3.5–5.1)
SODIUM: 134 mmol/L — AB (ref 135–145)

## 2014-09-11 LAB — HEPATITIS PANEL, ACUTE
HCV Ab: NEGATIVE
HEP B C IGM: NONREACTIVE
Hep A IgM: NONREACTIVE
Hepatitis B Surface Ag: NEGATIVE

## 2014-09-11 LAB — AFP TUMOR MARKER: AFP-Tumor Marker: 1.9 ng/mL (ref 0.0–8.3)

## 2014-09-11 LAB — PROTIME-INR
INR: 1.65 — ABNORMAL HIGH (ref 0.00–1.49)
Prothrombin Time: 19.7 seconds — ABNORMAL HIGH (ref 11.6–15.2)

## 2014-09-11 LAB — LACTATE DEHYDROGENASE: LDH: 386 U/L — ABNORMAL HIGH (ref 94–250)

## 2014-09-11 LAB — AMYLASE, PLEURAL FLUID: Amylase, Pleural Fluid: 7 U/L

## 2014-09-11 MED ORDER — HYDROMORPHONE HCL 1 MG/ML IJ SOLN
0.5000 mg | INTRAMUSCULAR | Status: DC | PRN
  Administered 2014-09-11 – 2014-09-12 (×4): 1 mg via INTRAVENOUS
  Filled 2014-09-11 (×4): qty 1

## 2014-09-11 NOTE — Progress Notes (Signed)
LOS: 8 days   Subjective: Pt still complaining of pain to the left leg. He said that he feels better overall.   Objective: BP 112/61 mmHg  Pulse 84  Temp(Src) 97.9 F (36.6 C) (Oral)  Resp 18  Ht 5\' 11"  (1.803 m)  Wt 134.2 kg (295 lb 13.7 oz)  BMI 41.28 kg/m2  SpO2 95%  Intake/Output Summary (Last 24 hours) at 09/11/14 6503 Last data filed at 09/10/14 2125  Gross per 24 hour  Intake    720 ml  Output   2550 ml  Net  -1830 ml    Physical Exam: General: WDWN, healthy, cooperative, awake and alert, No acute distress. Lying in bed. O2 vial nasal canula at 5l/min Skin: Skin color, texture, turgor normal. No rashes or lesions. HEENT:  Head : Atraumatic, normocephalic. Eye: PERRL, sclera anicteric, no conjunctival injection Nose: no nasal congestion.patent, no discharge Throat: No tonsillar erythema, exudates or enlargement. Mouth: Moist mucus, no lesions, dentition good. Neck: supple, no LAD Heart: Pulse regular rate and rhythm, normal S1S2, no murmurs appreciated.  Chest/ Lung: Normal depth and effort.  No tenderness to palpation Lungs sounds:Diffuse crackles bilaterally, lung sounds diminished to left side.  No rales, rhonchi, wheezing, or rubs. Abdomen: Soft, NTND, +BS, no hepatosplenomegaly noted.  Extremities: LE edema + 2 left >> right . Left lower extremity mildly colder then right and red from the ankle to the knee.                          Left pedal pulse present( doppler used) Labs/Studies: I have reviewed labs and studies from last 24hrs per EMR.  Medications: I have reviewed the patient's current medications.  Assessment/Plan: Active Problems:   IDDM (insulin dependent diabetes mellitus)   CAP (community acquired pneumonia)   HLD (hyperlipidemia)   BP (high blood pressure)   Gout of ankle   Atrial fibrillation   Pulmonary embolus   Disseminated malignancy of unknown primary   Phillip Meyer is a 74 yo male with an history of type 2  diabetes, hypertension, congestive heart failure, hyperlipidemia, A-fib, and gout who presents with a productive cough and bilateral chest pain on inspiration have been treated for CAP with IV antibiotics for 7 days with no significant improvement of symptoms, CTA chest done on 09/08/14 was positive for bilateral PE and great suspicion for metastatic lung cancer. Pt started on IV unfractionate heparin. Antibiotherapy discontinued on 09/10/14.    #  Metastatic lung cancer with left pleural effusion s/p thoracocentesis : CTA chest done on 09/08/14 reports "Evidence of metastatic malignancy with innumerable bilateral small pulmonary nodules as well as multiples liver lesions ". Unclear of the primary cancer location at this time. CT abdomen and pelvis done on 09/09/14 was not definite for identifying the primary cancer but with strong consideration for hepatocellular carcinoma. Alphafoetoprotein values are within normal range, hepatitis panel is negative.  It also showed right atrial filling defect concerning for thrombus or metastasis. Thoracocentesis yielded 430 cc of bloody pleural fluid, fluid more likely an exudates since we have two criteria out of the three-test rule. Pleural fluid protein greater than 2.9 g/dl at 3.6. And pleural fluid LDH greater than 0.45 times the upper limit of the lab normal serum LDH. This findings are consistent with lung cancer. Pleural exudates are also seen in pulmonary embolism and bacterial infection.    -plan for Liver biopsy when INR at required goal of 1.5 probably tomorrow.  -  continuous o2 sat -O 2 supplementation. -Duoneb PRN -incentive spirometry. -Flutter valve -Mucinex BID.  # Bilateral PE: As demonstrated by CTA chest. Pt is still dyspneic and requiring O2 supplementation through nasal canula at 5 l /min., pt still on continuous IV heparin because of plan to do liver biopsy tomorrow. Will be replaced by low molecular weight heparin therapy after all  invasives procedures are competed. Pt also have bilateral LE DVT concerning for hypercoagulability sate due to malignancy.   -continuous O 2 sat -Oxygen supplementation as needed. -continue unfractionated heparin IV.  # Bilateral LE DVT with left leg redness: pt developed bilateral DVT as demonstrated by LE duplex done on 09/06/14. Of note, pt have been on his home coumadin since admission and have been therapeutic. This gives additional concern for hypercoagulability related to malignancy. Pt also concern for LLE coldness and reduction on ABI to 0.87 from 1.2 (study done 02/27/2013). He also has some redness to the LLE notably to the left lower leg  area. Vascular surgeon was initially consulted and stated that the pt ABI represents mild arterial disease and did not require vascular surgery intervention at that time since pt was not having any symptom at that time. Pt was reevaluated in the light of new redness and increased pain to the leg. It was  recommended to repeat LE arterial duplex.  -LE arterial duplex.  -Continue unfractionated heparin. -Q4 neurovascular check. -continue monitoring.  -TED hose to LE -Warm compresses.    # Atrial fibrillation :pt has chronic A-fib and he takes Cardizem 120 mg daily, Sotalol 80 mg BID at home.. Pt home Sotalol was restarted on 09/04/14 . Metoprolol was also added for better rate control .pt HR is better controled at this point. HR fluctuates between 80's -90's  - appreciate cardiology consult. -Metoprolol 25 mg BID  - Continue home Sotalol -Continue Cardizem 30 mg Q6 h.  -monitor on telemetry  -coumadin stopped ( pt on Heparin drip)   # Congestive heart failure. Pt CTA showed a component of interstitial edema, pt still have LE edema and lung crackles on auscultation. Pt has documented systolic heart failure with last Echocardiogram showing an EF of 45-50 %.  - Continue lasix 80 mg BID.  -continue home diltiazem -hold home enalapril d/t  BP still soft.  -strict I & O -Daily weight -fluid restriction.    # CAP: Resolved, pt has completed a 7 days of IV antibiotics.All antibiotics were stopped on 09/10/14 . WBC down to 16.9 from 17.9 yesterday.  Pt still have mild non productive cough and hypoxia requiring oxygen supplementation. This symptoms are more likely related to pt current bilateral PE and lung malignancy.    # Diabetes type 2 : Pt home medication consists on Insuline Lantus 60 U at bed time and Novolin 30 U TID. He takes Gabapentin for his diabetes neuropathy and Reglan for gastroparesis. Pt had a couple of episode of hypoglycemia during this admission. Last one was on 09/07/14 at 49. She was already on a lower dose of basal insuline( 45 U at bedtime) and meal coverage Novolin 15 TID. This was reduced accordingly.Last CBG= 82.  - continue sensitive SSI - Lantus at 30 U at bedtime - Novolog 10 U TID - gabapentin 600mg  QID - Reglan 5 mg before meals.   # Gout:  - continue home Colchicine  # Hypertension: pt is normotensive for the most part. BP: 97/57-130/66 -Continue Diltiazem 30 mg Q 6 h -Continue home Sotalol -Start lasix IV 80  mg BID   # Hyperlipidemia :  -Continue home Zocor 10 mg.    PPx: IV heparin  Diet: Carb modified diet  Dispo: Disposition is deferred at this time, awaiting improvement of current medical problems. Anticipated discharge in approximately 1 day(s).   This is a Careers information officer Note. The care of the patient was discussed with Dr. Raelene Bott and the assessment and plan formulated with their assistance. Please see their attached note for official documentation of the daily encounter.

## 2014-09-11 NOTE — Progress Notes (Signed)
Patient Profile: 74 year old male with past medical history of PAF on coumadin and sotalol, nonischemic cardiomyopathy ED 76-72%, chronic systolic/diastolic CHF, diabetes mellitus, hypertension admitted to [facility] on 3/16 with ? PNA. Now diagnosed with DVT and bilateral pulmonary emboli. Findings on CT consistent with metastatic malignancy. Cardiology consulted for evaluation of atrial fibrillation and acute on chronic CHF.    Subjective: No major complaints. Asymptomatic regarding his arrhthymia. No significant dyspnea. Family by bedside.   Objective: Vital signs in last 24 hours: Temp:  [97.8 F (36.6 C)-98 F (36.7 C)] 97.9 F (36.6 C) (03/24 0623) Pulse Rate:  [69-98] 69 (03/24 0949) Resp:  [16-20] 18 (03/24 0949) BP: (105-130)/(58-66) 105/58 mmHg (03/24 0949) SpO2:  [95 %-100 %] 97 % (03/24 0949) Last BM Date: 09/08/14  Intake/Output from previous day: 03/23 0701 - 03/24 0700 In: 720 [P.O.:720] Out: 2950 [Urine:2950] Intake/Output this shift: Total I/O In: 120 [P.O.:120] Out: -   Medications Current Facility-Administered Medications  Medication Dose Route Frequency Provider Last Rate Last Dose  . antiseptic oral rinse (CPC / CETYLPYRIDINIUM CHLORIDE 0.05%) solution 7 mL  7 mL Mouth Rinse BID Madilyn Fireman, MD   7 mL at 09/11/14 0939  . colchicine tablet 0.6 mg  0.6 mg Oral Daily Luan Moore, MD   0.6 mg at 09/11/14 0947  . dextromethorphan-guaiFENesin (MUCINEX DM) 30-600 MG per 12 hr tablet 1 tablet  1 tablet Oral BID Luan Moore, MD   1 tablet at 09/11/14 [phone]  . diltiazem (CARDIZEM) tablet 30 mg  30 mg Oral 4 times per day Arnoldo Lenis, MD   30 mg at 09/11/14 0534  . docusate sodium (COLACE) capsule 200 mg  200 mg Oral Daily Lucious Groves, DO   200 mg at 09/11/14 8366  . ferrous sulfate tablet 325 mg  325 mg Oral Q breakfast Lucious Groves, DO   325 mg at 09/11/14 2947  . furosemide (LASIX) injection 80 mg  80 mg Intravenous BID Peter M Martinique, MD   80 mg at  09/11/14 6546  . gabapentin (NEURONTIN) tablet 600 mg  600 mg Oral BID Lucious Groves, DO   600 mg at 09/11/14 5035  . heparin ADULT infusion 100 units/mL (25000 units/250 mL)  1,900 Units/hr Intravenous Continuous Erenest Blank, RPH 19 mL/hr at 09/11/14 0159 1,900 Units/hr at 09/11/14 0159  . HYDROmorphone (DILAUDID) injection 0.5-1 mg  0.5-1 mg Intravenous Q2H PRN Luan Moore, MD      . insulin aspart (novoLOG) injection 0-15 Units  0-15 Units Subcutaneous TID WC Lucious Groves, DO   2 Units at 09/10/14 1802  . insulin aspart (novoLOG) injection 0-5 Units  0-5 Units Subcutaneous QHS Lucious Groves, DO   2 Units at 09/08/14 2148  . insulin aspart (novoLOG) injection 10 Units  10 Units Subcutaneous TID WC Luan Moore, MD   10 Units at 09/10/14 1802  . insulin glargine (LANTUS) injection 30 Units  30 Units Subcutaneous QHS Luan Moore, MD   30 Units at 09/10/14 2147  . ipratropium-albuterol (DUONEB) 0.5-2.5 (3) MG/3ML nebulizer solution 3 mL  3 mL Nebulization Q4H PRN Luan Moore, MD      . metoCLOPramide (REGLAN) tablet 5 mg  5 mg Oral TID AC Lucious Groves, DO   5 mg at 09/11/14 1128  . metoprolol tartrate (LOPRESSOR) tablet 25 mg  25 mg Oral BID Peter M Martinique, MD   25 mg at 09/11/14 4656  . multivitamin with minerals tablet 1 tablet  1  tablet Oral Daily Madilyn Fireman, MD   1 tablet at 09/11/14 [id number]  . rOPINIRole (REQUIP) tablet 2 mg  2 mg Oral BID Lucious Groves, DO   2 mg at 09/11/14 9604  . simvastatin (ZOCOR) tablet 10 mg  10 mg Oral QHS Lucious Groves, DO   10 mg at 09/10/14 2147  . sodium chloride (OCEAN) 0.65 % nasal spray 1 spray  1 spray Each Nare PRN Luan Moore, MD      . sodium chloride 0.9 % injection 3 mL  3 mL Intravenous Q12H Lucious Groves, DO   3 mL at 09/11/14 0940  . sotalol (BETAPACE) tablet 80 mg  80 mg Oral BID Luan Moore, MD   80 mg at 09/11/14 0953  . traMADol (ULTRAM) tablet 100 mg  100 mg Oral Q6H PRN Luan Moore, MD   100 mg at 09/11/14 0539     PE: General appearance: alert, cooperative and no distress Neck: no carotid bruit and no JVD Lungs: clear to auscultation bilaterally Heart: irregularly irregular rhythm Extremities: 2+ bilateral LEE Pulses: 2+ and symmetric Skin: warm and dry Neurologic: Grossly normal  Lab Results:   Recent Labs  09/09/14 0405 09/10/14 0524 09/11/14 0450  WBC 17.4* 17.9* 16.9*  HGB 12.1* 13.2 12.2*  HCT 37.3* 40.1 38.7*  PLT 217 261 302   BMET  Recent Labs  09/09/14 0405 09/10/14 0524 09/11/14 0450  NA 136 133* 134*  K 3.8 4.9 4.1  CL 90* 90* 90*  CO2 35* 31 36*  GLUCOSE 98 119* 86  BUN 23 19 23   CREATININE 0.69 0.71 0.64  CALCIUM 8.5 8.6 8.3*   PT/INR  Recent Labs  09/09/14 0405 09/10/14 0524 09/11/14 0450  LABPROT 31.1* 23.1* 19.7*  INR 2.97* 2.03* 1.65*    Assessment/Plan  Principal Problem:   Disseminated malignancy of unknown primary Active Problems:   IDDM (insulin dependent diabetes mellitus)   CAP (community acquired pneumonia)   HLD (hyperlipidemia)   BP (high blood pressure)   Gout of ankle   Atrial fibrillation   Pulmonary embolus  1. Paroxysmal atrial fibrillation-the patient has recurrent atrial fibrillation in setting of acute pulmonary embolus. In reviewing his notes from [facility] he was seen in clinic in January 2016 and appeared to have been in sinus at that time. He has been in persistant afib since admission. He's asymptomatic.  -- On chronic coumadin. CHADS vasc is 3.  -- Elevated rates initially likely driven by his PE. Resting rate now better controlled on AAD, BB and CCB. -- Currently on oral dilt 30mg  qid, sotalol 80mg  bid, 25 mg of metoprolol BID and coumadin.  -- Not a good candidate for DCCV at this point with multiple stressors and increased risk of anesthesia with new PE. Will focus on rate control now. Patient may convert on Sotalol once stress of acute PE improves.   2. Nonischemic cardiomyopathy- EF 40-45% -- He  was on an ACE inhibitor at the time of admission. This is being held as his blood pressure is borderline. Would resume at lower dose as blood pressure improves- but not today. SBP still in the low 100s.   4. Bilateral lower extremity subacute DVT: preliminary report on LE dopplers on 08/1914 shows: subacute DVT noted in the right common femoral, proximal femoral, and left common femoral veins.  -- Continue Coumadin once liver biopsy complete. Now on heparin.   5. Acute pulmonary embolus. Anticoagulation per primary team. Nearly occlusive thrombus in the left  lower lobe pulmonary artery as well as several other thrombi throughout bilateral lungs. No evidence of right heart strain by CTA.  6. NSVT- tele with afib with frequent PVCs. No further runs on NSVT in past 12 hrs.  7. Malignancy of unknown primary with metastasis. Work up per primary team.   3. Chronic combined systolic/diastolic congestive heart failure-  -- 2D echo 02/2014 [facility] [place] LVEF 40-45%, indeterminate diastolic function but evidence of elevated LA pressures, borderline reduced RV function. Echo here is similar with EF 45%. RV function reported as normal.  -- On IV Lasix. Diuresing well. -2.95 L out in past 24 hrs. -14.8L total since admit.  -- Still with significant volume overload with bilateral pitting edema. Renal function stable. K normal. Continue IV Lasix, 80 mg BID.  -- Strict I/Os, daily weights, and low sodium diet.     8. Lower ext arterial dopplers difficult to obtain with edema but mild PAD stenosis on Lt -he has Lt foot pain and foot cool on Neurontin. Vascular following--After revewing his CT and supratherapeutic INR, this patient's presentation is highly suspicious for neoplasm related thrombophilia. - In this setting, any revascularization procedure is likely futile, as combined venous and arterial thrombosis is a presentation of end-stage of the neoplasm. - Additionally, this patient's history is not  consistent with acute arterial occlusion given the relative lack of sx. His exam is of limited value due to pre-existing neurologic loss in that L foot.  9. CAP- resolving per IM on ABX completed course      LOS: 8 days    Brittainy M. Ladoris Gene 09/11/2014 11:50 AM  Patient seen, examined. Available data reviewed. Agree with findings, assessment, and plan as outlined by Lyda Jester, PA-C. The patient is independently interviewed and examined. He is resting comfortably. Liver biopsy had to be delayed because of elevated INR. He continues to diurese well with IV Lasix and is about 15 L negative since admission. Still has marked edema but I suspect this is multifactorial. Overall his cardiac status is stable and no changes were recommended today.  Sherren Mocha, M.D. 09/11/2014 3:25 PM

## 2014-09-11 NOTE — Progress Notes (Addendum)
Patient ID: Phillip Meyer, male   DOB: September 10, 1940, 74 y.o.   MRN: [id number]   Pt tentatively scheduled for liver lesion bx today Unfortunately, INR still 1.65 today Has been on coumadin for Afib (off for procedure)  Need INR 1.5 per Dr Earleen Newport Will recheck INR in am  No bx today Possible 3/25  RN aware---will inform pt

## 2014-09-11 NOTE — Progress Notes (Signed)
ANTICOAGULATION CONSULT NOTE - Follow Up Consult  Pharmacy Consult:  Heparin Indication:  History of AFib and DVT + new PEs and DVTs  Allergies  Allergen Reactions  . Elavil [Amitriptyline] Other (See Comments)    Passes out    Patient Measurements: Height: 5\' 11"  (180.3 cm) Weight: 295 lb 13.7 oz (134.2 kg) IBW/kg (Calculated) : 75.3 Heparin Dosing Weight: 105 kg  Vital Signs: Temp: 97.9 F (36.6 C) (03/24 0623) Temp Source: Oral (03/24 0623) BP: 105/58 mmHg (03/24 0949) Pulse Rate: 69 (03/24 0949)  Labs:  Recent Labs  09/09/14 0405  09/10/14 0524 09/10/14 0855 09/10/14 1010 09/11/14 0450  HGB 12.1*  --  13.2  --   --  12.2*  HCT 37.3*  --  40.1  --   --  38.7*  PLT 217  --  261  --   --  302  LABPROT 31.1*  --  23.1*  --   --  19.7*  INR 2.97*  --  2.03*  --   --  1.65*  HEPARINUNFRC 0.24*  < >  --  1.06* 0.36 0.54  CREATININE 0.69  --  0.71  --   --  0.64  < > = values in this interval not displayed.  Estimated Creatinine Clearance: 113.3 mL/min (by C-G formula based on Cr of 0.64).    Assessment: 43 YOM on Coumadin PTA for history of Afib and DVT in November of 2015.  Patient's INRs have been therapeutic or supra-therapeutic and found to have new PEs and DVTs that extended throughout the left leg.  Currently on IV heparin and heparin level is therapeutic.  Noted documentation of yielding bloody fluid during thoracentesis.  No overt bleeding documented.   Goal of Therapy:  Heparin level 0.3-0.7 units/ml Monitor platelets by anticoagulation protocol: Yes     Plan:  - Continue heparin gtt at 1900 units/hr - Daily HL / CBC    Tangala Wiegert D. Mina Marble, PharmD, BCPS Pager:  [phone] 09/11/2014, 12:49 PM

## 2014-09-11 NOTE — Clinical Social Work Note (Signed)
CSW met with patient and his family to discuss SNF placement and bed offers.  Family would like patient to go to [facility] in [place] for short term rehab, CSW contacted [facility] to confirm they can take patient, and [facility] said yes they will provide a bed offer.  Patient and his family were given updated information and are pleased that patient has been accepted to [facility].  Patient will discharge to SNF once he is medically ready and discharge orders have been received.  CSW assessment and placement note to follow FL2 and DNR on patient's chart to be signed by physician.  Jones Broom. [place], MSW, [place] 09/11/2014 5:09 PM

## 2014-09-11 NOTE — Progress Notes (Signed)
Subjective:  Patient reporting increased left foot pain over the last 24 hours. He states that he is not able to walk on his foot and work with physical therapy because of it.  Objective: Vital signs in last 24 hours: Filed Vitals:   09/10/14 1757 09/10/14 2116 09/11/14 0056 09/11/14 0623  BP: 116/66 121/62 130/66 112/61  Pulse:  98  84  Temp:  97.8 F (36.6 C)  97.9 F (36.6 C)  TempSrc:  Oral  Oral  Resp:  20  18  Height:      Weight:      SpO2:  100%  95%   Weight change:   Intake/Output Summary (Last 24 hours) at 09/11/14 0700 Last data filed at 09/10/14 2125  Gross per 24 hour  Intake    720 ml  Output   2950 ml  Net  -2230 ml    General: resting in chair, eating breakfast, nasal cannula in place, currently on 4.5 L nasal cannula Cardiac: Irregularly irregular, normal rate, no rubs, murmurs or gallops Pulm: Rales most remarkable in the left lung base, moving normal volumes of air Abd: soft, nontender, nondistended, BS present Ext: Left extremity colder than right, 2+ bilateral lower extremity edema, left lower extremity cold with cutaneous erythematous change overlying and superior to the ankle, worsened over the interval Neuro: alert and oriented X3, cranial nerves II-XII grossly intact Skin: no rashes or lesions noted Psych: appropriate affect and cognition  Lab Results: Basic Metabolic Panel:  Recent Labs Lab 09/09/14 0405 09/10/14 0524  NA 136 133*  K 3.8 4.9  CL 90* 90*  CO2 35* 31  GLUCOSE 98 119*  BUN 23 19  CREATININE 0.69 0.71  CALCIUM 8.5 8.6   Liver Function Tests:  Recent Labs Lab 09/04/14 0834  AST 30  ALT 13  ALKPHOS 93  BILITOT 1.2  PROT 6.1  ALBUMIN 2.3*   No results for input(s): LIPASE, AMYLASE in the last 168 hours. No results for input(s): AMMONIA in the last 168 hours. CBC:  Recent Labs Lab 09/10/14 0524 09/11/14 0450  WBC 17.9* 16.9*  HGB 13.2 12.2*  HCT 40.1 38.7*  MCV 93.9 95.8  PLT 261 302   Cardiac  Enzymes: No results for input(s): CKTOTAL, CKMB, CKMBINDEX, TROPONINI in the last 168 hours. BNP: No results for input(s): PROBNP in the last 168 hours. D-Dimer: No results for input(s): DDIMER in the last 168 hours. CBG:  Recent Labs Lab 09/09/14 1704 09/09/14 2137 09/10/14 0738 09/10/14 1401 09/10/14 1734 09/10/14 2121  GLUCAP 129* 183* 118* 169* 134* 138*   Coagulation:  Recent Labs Lab 09/08/14 0530 09/09/14 0405 09/10/14 0524 09/11/14 0450  LABPROT 35.7* 31.1* 23.1* 19.7*  INR 3.54* 2.97* 2.03* 1.65*    Micro Results: Recent Results (from the past 240 hour(s))  Culture, blood (routine x 2) Call MD if unable to obtain prior to antibiotics being given     Status: None   Collection Time: 09/06/2014  6:46 PM  Result Value Ref Range Status   Specimen Description BLOOD RIGHT ANTECUBITAL  Final   Special Requests BOTTLES DRAWN AEROBIC AND ANAEROBIC 5CC  Final   Culture   Final    NO GROWTH 5 DAYS Performed at [facility]    Report Status 09/10/2014 FINAL  Final  Culture, blood (routine x 2) Call MD if unable to obtain prior to antibiotics being given     Status: None   Collection Time: 08/24/2014  6:57 PM  Result Value  Ref Range Status   Specimen Description BLOOD RIGHT HAND  Final   Special Requests BOTTLES DRAWN AEROBIC ONLY 3CC  Final   Culture   Final    NO GROWTH 5 DAYS Performed at [facility]    Report Status 09/10/2014 FINAL  Final   Studies/Results: Dg Chest 1 View  09/10/2014   CLINICAL DATA:  Post left thoracentesis.  EXAM: CHEST  1 VIEW  COMPARISON:  09/07/2014  FINDINGS: There are low lung volumes with bibasilar opacities, likely atelectasis. Decreasing left effusion post thoracentesis. No pneumothorax. Cardiomegaly with mild vascular congestion.  IMPRESSION: Decreasing left effusion following thoracentesis.  No pneumothorax.  Low lung volumes with bibasilar atelectasis.   Electronically Signed   By: Rolm Baptise M.D.   On: 09/10/2014  12:54   Ct Abdomen Pelvis W Contrast  09/10/2014   ADDENDUM REPORT: 09/10/2014 10:22  ADDENDUM: Low-density expansion of the pancreatic tail which could reflect a pancreas adenocarcinoma. Planned liver biopsy will likely clarify this finding.   Electronically Signed   By: Monte Fantasia M.D.   On: 09/10/2014 10:22   09/10/2014   CLINICAL DATA:  Question cancer.  Multiple myeloma.  EXAM: CT ABDOMEN AND PELVIS WITH CONTRAST  TECHNIQUE: Multidetector CT imaging of the abdomen and pelvis was performed using the standard protocol following bolus administration of intravenous contrast.  CONTRAST:  135m OMNIPAQUE IOHEXOL 300 MG/ML  SOLN  COMPARISON:  02/18/2014  FINDINGS: BODY WALL: Medication injection site in the left lower quadrant wall.  There is expansion of the subcutaneous fat and the left thigh muscles related to DVT. Bilateral DVT is visible, up to the common femoral veins.  LOWER CHEST: There is a small left pleural effusion which is stable from previous. No appreciable change in bilateral lower lobe pulmonary embolism and lung opacity.  There is a up to 28 mm lobulated mass along the right atrial wall, presumably a metastasis.  ABDOMEN/PELVIS:  Liver: There are numerous small hypo enhancing liver masses throughout the liver, with a confluent ill-defined mass in the right lobe measuring 10 cm. The other largest mass is in the segment 2 at 5 cm.  Liver morphology suggests cirrhosis, likely present before the metastases given fissure enlargement.  Biliary: Cholelithiasis.  No evidence of obstruction.  Pancreas: Unremarkable.  Spleen: Unremarkable.  Adrenals: Unremarkable.  Kidneys and ureters: No hydronephrosis or stone.  Bladder: Unremarkable.  Reproductive: No pathologic findings.  Bowel: No obstruction. No gross mass lesion.  Retroperitoneum: Moderate enlargement of lymph nodes in the deep liver drainage, with the portacaval node measuring 14 x 28 mm.  Peritoneum: No ascites or pneumoperitoneum.  Vascular:  No acute abnormality.  OSSEOUS: No metastasis identified. There is advanced and diffuse degenerative disc and facet disease status post extensive posterior spinal fusion from T12-L5. Prior sacral fusion has been removed. There is been multilevel discectomy, including at L5-S1 where there is pseudoarthrosis.  IMPRESSION: 1. Hepatic metastatic disease and hepatic drainage adenopathy. A primary malignancy is not definitively identified, but given cirrhosis and a dominant right liver mass, hepatocellular carcinoma is a strong consideration. 2. Right atrial filling defect, appearance and clinical circumstances favors metastasis over thrombus. Cardiac MRI could differentiate if clinically needed. 3. Known bilateral pulmonary embolism. There is DVT with clot into the bilateral common femoral veins. Venous obstructive changes noted in the left thigh.  Electronically Signed: By: JMonte FantasiaM.D. On: 09/09/2014 19:12   UKoreaThoracentesis Asp Pleural Space W/img Guide  09/10/2014   CLINICAL DATA:  Pulmonary  embolus. Left pleural effusion. Request for diagnostic and therapeutic thoracentesis.  EXAM: ULTRASOUND GUIDED LEFT THORACENTESIS  COMPARISON:  None.  PROCEDURE: An ultrasound guided thoracentesis was thoroughly discussed with the patient and questions answered. The benefits, risks, alternatives and complications were also discussed. The patient understands and wishes to proceed with the procedure. Written consent was obtained.  Ultrasound was performed to localize and mark an adequate pocket of fluid in the left chest. The area was then prepped and draped in the normal sterile fashion. 1% Lidocaine was used for local anesthesia. Under ultrasound guidance a 19 gauge Yueh catheter was introduced. Thoracentesis was performed. The catheter was removed and a dressing applied.  COMPLICATIONS: None immediate  FINDINGS: A total of approximately 430 mL of bloody pleural fluid was removed. A fluid sample wassent for laboratory  analysis.  IMPRESSION: Successful ultrasound guided left thoracentesis yielding 430 mL of pleural fluid.  Read by: Ascencion Dike PA-C   Electronically Signed   By: Markus Daft M.D.   On: 09/10/2014 12:00   Medications: I have reviewed the patient's current medications. Scheduled Meds: . antiseptic oral rinse  7 mL Mouth Rinse BID  . colchicine  0.6 mg Oral Daily  . dextromethorphan-guaiFENesin  1 tablet Oral BID  . diltiazem  30 mg Oral 4 times per day  . docusate sodium  200 mg Oral Daily  . ferrous sulfate  325 mg Oral Q breakfast  . furosemide  80 mg Intravenous BID  . gabapentin  600 mg Oral BID  . insulin aspart  0-15 Units Subcutaneous TID WC  . insulin aspart  0-5 Units Subcutaneous QHS  . insulin aspart  10 Units Subcutaneous TID WC  . insulin glargine  30 Units Subcutaneous QHS  . metoCLOPramide  5 mg Oral TID AC  . metoprolol tartrate  25 mg Oral BID  . multivitamin with minerals  1 tablet Oral Daily  . rOPINIRole  2 mg Oral BID  . simvastatin  10 mg Oral QHS  . sodium chloride  3 mL Intravenous Q12H  . sotalol  80 mg Oral BID   Continuous Infusions: . heparin 1,900 Units/hr (09/11/14 0159)   PRN Meds:.ipratropium-albuterol, morphine injection, sodium chloride, traMADol Assessment/Plan: Active Problems:   IDDM (insulin dependent diabetes mellitus)   CAP (community acquired pneumonia)   HLD (hyperlipidemia)   BP (high blood pressure)   Gout of ankle   Atrial fibrillation   Pulmonary embolus   Disseminated malignancy of unknown primary  Patient is a 74 year old male with a history of type 2 diabetes, hypertension, congestive heart failure, hyperlipidemia, gout admitted for community-acquired pneumonia whose hospital course has been complicated by persistent hypoxia found to be secondary to pulmonary embolism in the setting of likely extensive metastatic disease.  Metastatic cancer of uncertain primary: Patient was found to have innumerable bilateral small pulmonary  nodules as well as multiple liver lesions the largest of which measuring 11 cm in diameter in the right lobe of the liver. There is some suspicion for a primary hepatocellular carcinoma given concurrent cirrhosis although there is also a lesion in the tail the pancreas which is concerning for a primary pancreatic cancer. Pleural effusion found to be bloody which could be consistent with pulmonary infarction secondary to pulmonary embolism. The effusion was exudative which could be consistent with a bacterial infection, malignancy, or pulmonary embolism. -Will wait for interventional radiology decision on biopsy given INR of 1.65 this morning. -Unfractionated heparin in the setting of malignancy and possible invasive  procedures for tissue diagnosis. In the longer term, patient does not have any renal insufficiency so he may be a good candidate for low molecular weight heparin therapy after invasive procedures. -AFP pending -Hepatitis panel pending  Bilateral pulmonary embolism: Patient with very slightly decreased oxygen requirement at 4.5 L nasal cannula today. Nearly occlusive thrombus in the left lower lobe pulmonary artery as well as several other thrombi throughout bilateral lungs. No evidence of right heart strain by CTA. Echocardiogram also corroborates this with no evidence of right heart strain. Patient has maintained adequate blood pressures in the last several days, which argues against any intra-arterial intervention at this point. Patient has maintained a therapeutic INR on Coumadin throughout his hospitalization. It is not entirely clear whether this may represent a overt failure on Coumadin or if the pulmonary emboli may have preceded her current INR measurements. In either case, therapy with heparin is preferred in the setting of malignancy. -Unfractionated heparin  Community-acquired pneumonia: Likely resolved. White count trended down to 16.9 over last 24 hours.   Lower extremity vascular  disease: Patient found to have a subacute DVT in the right lower extremity on 09/06/2014. Patient also with pain and with overlying erythema in the left lower extremity that had been unresponsive to antibiotics making it or likely to be secondary to chronic venous insufficiency versus superficial thrombophlebitis. Vascular surgery has evaluated, though patient is a poor surgical candidate given hypercoagulability and malignancy. -Unfractionated heparin -Repeat lower extremity arterial duplex -Tramadol and morphine for pain -Elevate legs -TED hose if able to tolerate -Warm compresses  Paroxysmal atrial fibrillation:  Patient has remained in atrial fibrillation throughout this admission. Patient has remained in the 80s to low 100s. There is some concern in having patient can currently on metoprolol and sotalol. -Appreciate cardiology recommendations -Diltiazem 30 mg every 6 hours -Metoprolol has been titrated up to 25 mg twice a day -Continue home sotalol to help with sinus rhythm once reestablished.   Systolic congestive heart failure:  Patient has continued to diuresis with around -15.7 net negative over this hospitalization, with 2.2 L over the last 24 hours. Weight seems unchanged. However, patient still exhibiting signs of fluid overload clinically. Repeat echocardiogram showing mildly reduced systolic function of 44-03%. -Lasix 80 mg twice a day IV. -Appreciate cardiology recommendations.  Hypertension:  Patient has been able to sustain satisfactory blood pressures. At home, patient is on diltiazem 120 mg daily, enalapril 20 mg daily, Lasix reportedly up to 200 mg daily, sotalol 80 mg twice a day.  -Continue to hold home enalapril given low normal blood pressures  -Blood pressure management as above with paroxysmal atrial fibrillation.  Type two diabetes:  Patient has had satisfactory blood glucose control.  -Decreased lantus to 30 units daily  -Continue NovoLog 10 3 times a day before  meals  -Moderate sliding scale insulin with nighttime correction   Gout:  Patient currently on colchicine 0.6 mg 4 times a day. It is noted that there is a interaction with diltiazem. -Continue home colchicine at a lower dose of 0.6 mg daily.  Iron deficiency anemia: Patient is on 325 mg ferrous sulfate daily.  -Continue home ferrous sulfate.   Neuropathic pain: Patient is on gabapentin 600 mg twice a day.  -Continue home gabapentin   Diet: Carbohydrate modified  Prophylaxis: Patient currently on heparin and therapeutic Code status: DNR/DNI, POA is son, Sarthak Rubenstein cell [phone])  Dispo: Disposition is deferred at this time, awaiting improvement of current medical problems. Anticipated discharge in  approximately 0-2 days). -pending liver biopsy   The patient does have a current PCP (Zorita Pang, MD) and does need an [facility] hospital follow-up appointment after discharge.  The patient does not have transportation limitations that hinder transportation to clinic appointments.    LOS: 8 days   Services Needed at time of discharge: Y = Yes, Blank = No PT:   OT:   RN:   Equipment:   Other:    Luan Moore, MD 09/11/2014, 7:00 AM

## 2014-09-11 NOTE — Progress Notes (Signed)
Physical Therapy  On arrival, pt just back to bed with nursing and requesting pain medicine for LLE. Son present and discussed d/c plans. Pt has had significant decline (medically and functionally). He has Lt foot drop after prior back surgery and with current LLE edema he is unable to don his Lt AFO, which puts him at an even higher risk of falls.   Agree with plan for SNF on discharge as he cannot go home alone in current condition.   09/11/14 1501  PT Visit Information  Last PT Received On 09/11/14  Reason Eval/Treat Not Completed Other (comment)  PT - Assessment/Plan  PT Plan Discharge plan needs to be updated  PT Frequency (ACUTE ONLY) Min 2X/week  Follow Up Recommendations SNF  PT equipment None recommended by PT    09/11/2014 Barry Brunner, PT Pager: [phone]

## 2014-09-11 NOTE — Progress Notes (Signed)
  PROGRESS NOTE MEDICINE TEACHING ATTENDING   Day 8 of stay Patient name: Phillip Meyer   Medical record number: [id number] Date of birth: 05-Aug-1940   [name] on 4L today after 430 ml of pleural fluid removed. He complains of increasing foot pain. Blood pressure 105/58, pulse 69, temperature 97.9 F (36.6 C), temperature source Oral, resp. rate 18, height 5\' 11"  (1.803 m), weight 295 lb 13.7 oz (134.2 kg), SpO2 97 %. He is alert and oriented, comfortable, in no acute distress. PERRL, EOMI. Heart exhibits regular rate and rhythm, no murmurs. Lungs exhibit crackles and rhonchi at bases, although decreased from before. Abdomen is soft and non-tender. The left foot is warmer to touch than yesterday, however appears to have more redness and tenderness. There is stable swelling, apparently due to DVT. There are no gross focal neurological deficits apparent.   Plan of care  Metastatic lesion with uncertain primary malignancy - In consideration - liver, gut, pancreas. AFP has been ordered. Hepatitis panel is negative. We will order CEA and CA 19-9 today. Even though with limited accuracy, if one or the other of them is extremely high, it might towards a particular source. Liver biopsy when INR <1.5, likely tomorrow.   Contine heparin for PE  CAP - Antibiotic course completed. WBC trending down.   LE vascular impairment - On heparin currently for bilateral LE DVTs and PE. Increasing pain and erythema over left ankle - repeat ABI today.  Better pain control desired.   CHF and Afib - per cardiology recommendations.   Rest per resident note.   I have discussed the care of this patient with my IM team residents. Please see the resident note for details.  [place], [place] 09/11/2014, 11:31 AM.

## 2014-09-12 ENCOUNTER — Inpatient Hospital Stay (HOSPITAL_COMMUNITY): Payer: Medicare Other

## 2014-09-12 DIAGNOSIS — C787 Secondary malignant neoplasm of liver and intrahepatic bile duct: Secondary | ICD-10-CM

## 2014-09-12 DIAGNOSIS — I481 Persistent atrial fibrillation: Secondary | ICD-10-CM

## 2014-09-12 DIAGNOSIS — D630 Anemia in neoplastic disease: Secondary | ICD-10-CM

## 2014-09-12 LAB — GLUCOSE, CAPILLARY
GLUCOSE-CAPILLARY: 87 mg/dL (ref 70–99)
GLUCOSE-CAPILLARY: 84 mg/dL (ref 70–99)
Glucose-Capillary: 91 mg/dL (ref 70–99)
Glucose-Capillary: 61 mg/dL — ABNORMAL LOW (ref 70–99)
Glucose-Capillary: 192 mg/dL — ABNORMAL HIGH (ref 70–99)

## 2014-09-12 LAB — PROTIME-INR
INR: 1.38 (ref 0.00–1.49)
Prothrombin Time: 17.1 seconds — ABNORMAL HIGH (ref 11.6–15.2)

## 2014-09-12 LAB — HEPARIN LEVEL (UNFRACTIONATED): HEPARIN UNFRACTIONATED: 0.68 [IU]/mL (ref 0.30–0.70)

## 2014-09-12 LAB — PROCALCITONIN: Procalcitonin: 0.55 ng/mL

## 2014-09-12 MED ORDER — HEPARIN (PORCINE) IN NACL 100-0.45 UNIT/ML-% IJ SOLN
1850.0000 [IU]/h | INTRAMUSCULAR | Status: DC
  Filled 2014-09-12 (×2): qty 250

## 2014-09-12 MED ORDER — MIDAZOLAM HCL 2 MG/2ML IJ SOLN
INTRAMUSCULAR | Status: AC
  Filled 2014-09-12: qty 2

## 2014-09-12 MED ORDER — FENTANYL CITRATE 0.05 MG/ML IJ SOLN
INTRAMUSCULAR | Status: AC | PRN
  Administered 2014-09-12: 50 ug via INTRAVENOUS

## 2014-09-12 MED ORDER — HEPARIN (PORCINE) IN NACL 100-0.45 UNIT/ML-% IJ SOLN
2100.0000 [IU]/h | INTRAMUSCULAR | Status: DC
  Administered 2014-09-12 – 2014-09-14 (×3): 1900 [IU]/h via INTRAVENOUS
  Administered 2014-09-14: 2300 [IU]/h via INTRAVENOUS
  Administered 2014-09-15: 2100 [IU]/h via INTRAVENOUS
  Administered 2014-09-15: 2300 [IU]/h via INTRAVENOUS
  Filled 2014-09-12 (×8): qty 250

## 2014-09-12 MED ORDER — LIDOCAINE HCL (PF) 1 % IJ SOLN
INTRAMUSCULAR | Status: AC
  Filled 2014-09-12: qty 10

## 2014-09-12 MED ORDER — HYDROMORPHONE HCL 2 MG PO TABS
2.0000 mg | ORAL_TABLET | ORAL | Status: DC | PRN
  Administered 2014-09-12 – 2014-09-16 (×10): 2 mg via ORAL
  Filled 2014-09-12 (×10): qty 1

## 2014-09-12 MED ORDER — GELATIN ABSORBABLE 12-7 MM EX MISC
CUTANEOUS | Status: AC
  Filled 2014-09-12: qty 1

## 2014-09-12 MED ORDER — MIDAZOLAM HCL 2 MG/2ML IJ SOLN
INTRAMUSCULAR | Status: AC | PRN
  Administered 2014-09-12: 1 mg via INTRAVENOUS

## 2014-09-12 MED ORDER — ENOXAPARIN SODIUM 120 MG/0.8ML ~~LOC~~ SOLN
120.0000 mg | Freq: Two times a day (BID) | SUBCUTANEOUS | Status: DC
  Filled 2014-09-12 (×2): qty 0.8

## 2014-09-12 MED ORDER — FENTANYL CITRATE 0.05 MG/ML IJ SOLN
INTRAMUSCULAR | Status: AC
  Filled 2014-09-12: qty 2

## 2014-09-12 NOTE — Sedation Documentation (Signed)
Bandaid R lateral flank area, dry, intact-no bleeding noted

## 2014-09-12 NOTE — Clinical Social Work Psychosocial (Signed)
Clinical Social Work Department BRIEF PSYCHOSOCIAL ASSESSMENT 09/12/2014  Patient:  JAMARCO, ZALDIVAR     Account Number:  [id number]     Admit date:  08/28/2014  Clinical Social Worker:  Dian Queen  Date/Time:  09/11/2014 05:16 PM  Referred by:  Physician  Date Referred:  09/11/2014 Referred for  SNF Placement   Other Referral:   Interview type:  Patient Other interview type:   Family    PSYCHOSOCIAL DATA Living Status:  ALONE Admitted from facility:   Level of care:   Primary support name:  Yahye Siebert Primary support relationship to patient:  FAMILY Degree of support available:   Patient's son does not live close by, patient does not have much local support.    CURRENT CONCERNS Current Concerns  Post-Acute Placement   Other Concerns:    SOCIAL WORK ASSESSMENT / PLAN Patient is a 74 year old male who lives by himself. Patient is alert and oriented x4 and has family at bedside. Patient was talkative and pleasant, patient expressed he is feeling much better than yesterday.  CSW discussed SNF placement with patient, who stated he has never been before, but is open to going to [facility] in [place].  CSW explained process of discharging to SNF, and patient nor his family had any other questions.  Patient and family are in agreement to going to SNF for short term rehab.  Patient will be discharged once he is medically ready and discharge orders have been received.   Assessment/plan status:  Psychosocial Support/Ongoing Assessment of Needs Other assessment/ plan:   Information/referral to community resources:    PATIENT'S/FAMILY'S RESPONSE TO PLAN OF CARE: Patient and family in agreement to going to SNF for short term rehab.   Jones Broom. [place], MSW, [place] 09/12/2014 5:21 PM

## 2014-09-12 NOTE — Progress Notes (Signed)
Patient Name: Phillip Meyer Date of Encounter: 09/12/2014  Principal Problem:   Disseminated malignancy of unknown primary Active Problems:   IDDM (insulin dependent diabetes mellitus)   CAP (community acquired pneumonia)   HLD (hyperlipidemia)   BP (high blood pressure)   Gout of ankle   Atrial fibrillation   Pulmonary embolus   Primary Cardiologist: BC here, generally [place]  Patient Profile: 74 year old male with past medical history of PAF on coumadin and sotalol, nonischemic cardiomyopathy ED 43-32%, chronic systolic/diastolic CHF, diabetes mellitus, hypertension admitted to [facility] on 3/16 with ? PNA. Now diagnosed with DVT and bilateral pulmonary emboli. Findings on CT consistent with metastatic malignancy. Cardiology consulted for evaluation of atrial fibrillation and acute on chronic CHF.   SUBJECTIVE: Breathing is OK, LLE is very painful, increasing redness on his calf. Hopes the liver bx and LE Dopplers will be done today.  OBJECTIVE Filed Vitals:   09/11/14 1423 09/11/14 1712 09/11/14 2129 09/12/14 0522  BP: 115/62 126/56 131/60 112/58  Pulse: 103 112 108 90  Temp: 97.7 F (36.5 C)  98.5 F (36.9 C) 97.9 F (36.6 C)  TempSrc: Oral  Oral Oral  Resp: 18  18 18   Height:      Weight:      SpO2: 95%  95% 97%    Intake/Output Summary (Last 24 hours) at 09/12/14 1131 Last data filed at 09/12/14 0857  Gross per 24 hour  Intake   1092 ml  Output   4575 ml  Net  -3483 ml   Filed Weights   09/08/14 0500 09/09/14 0519 09/10/14 0540  Weight: 289 lb 7.4 oz (131.3 kg) 293 lb 10.4 oz (133.2 kg) 295 lb 13.7 oz (134.2 kg)    PHYSICAL EXAM General: Well developed, well nourished, male in no acute distress. Head: Normocephalic, atraumatic.  Neck: Supple without bruits, JVD not elevated but difficult to assess 2nd body habitus. Lungs:  Resp regular and unlabored, bibasilar rales. Heart: Irreg, S1, S2, no S3, S4, or murmur; no rub. Abdomen: Soft, non-tender,  non-distended, BS + x 4.  Extremities: No clubbing, cyanosis, 2+ edema. Erythema to Left calf area with some healing wounds, no drainage Neuro: Alert and oriented X 3. Moves all extremities spontaneously. Psych: Normal affect.  LABS: CBC: Recent Labs  09/10/14 0524 09/11/14 0450  WBC 17.9* 16.9*  HGB 13.2 12.2*  HCT 40.1 38.7*  MCV 93.9 95.8  PLT 261 302   INR: Recent Labs  09/12/14 0445  INR 9.51   Basic Metabolic Panel: Recent Labs  09/10/14 0524 09/11/14 0450  NA 133* 134*  K 4.9 4.1  CL 90* 90*  CO2 31 36*  GLUCOSE 119* 86  BUN 19 23  CREATININE 0.71 0.64  CALCIUM 8.6 8.3*   BNP:  B NATRIURETIC PEPTIDE  Date/Time Value Ref Range Status  08/30/2014 06:46 PM 270.5* 0.0 - 100.0 pg/mL Final    TELE: Atrial fib, RVR frequently, PVCs at times       Radiology/Studies: Dg Chest 1 View 09/10/2014   CLINICAL DATA:  Post left thoracentesis.  EXAM: CHEST  1 VIEW  COMPARISON:  09/07/2014  FINDINGS: There are low lung volumes with bibasilar opacities, likely atelectasis. Decreasing left effusion post thoracentesis. No pneumothorax. Cardiomegaly with mild vascular congestion.  IMPRESSION: Decreasing left effusion following thoracentesis.  No pneumothorax.  Low lung volumes with bibasilar atelectasis.   Electronically Signed   By: Rolm Baptise M.D.   On: 09/10/2014 12:54   US Thoracentesis Asp Pleural  Space W/img Guide 09/10/2014   CLINICAL DATA:  Pulmonary embolus. Left pleural effusion. Request for diagnostic and therapeutic thoracentesis.  EXAM: ULTRASOUND GUIDED LEFT THORACENTESIS  COMPARISON:  None.  PROCEDURE: An ultrasound guided thoracentesis was thoroughly discussed with the patient and questions answered. The benefits, risks, alternatives and complications were also discussed. The patient understands and wishes to proceed with the procedure. Written consent was obtained.  Ultrasound was performed to localize and mark an adequate pocket of fluid in the left chest. The  area was then prepped and draped in the normal sterile fashion. 1% Lidocaine was used for local anesthesia. Under ultrasound guidance a 19 gauge Yueh catheter was introduced. Thoracentesis was performed. The catheter was removed and a dressing applied.  COMPLICATIONS: None immediate  FINDINGS: A total of approximately 430 mL of bloody pleural fluid was removed. A fluid sample wassent for laboratory analysis.  IMPRESSION: Successful ultrasound guided left thoracentesis yielding 430 mL of pleural fluid.  Read by: Ascencion Dike PA-C   Electronically Signed   By: Markus Daft M.D.   On: 09/10/2014 12:00   Current Medications:  . antiseptic oral rinse  7 mL Mouth Rinse BID  . colchicine  0.6 mg Oral Daily  . dextromethorphan-guaiFENesin  1 tablet Oral BID  . diltiazem  30 mg Oral 4 times per day  . docusate sodium  200 mg Oral Daily  . ferrous sulfate  325 mg Oral Q breakfast  . furosemide  80 mg Intravenous BID  . gabapentin  600 mg Oral BID  . insulin aspart  0-15 Units Subcutaneous TID WC  . insulin aspart  0-5 Units Subcutaneous QHS  . insulin aspart  10 Units Subcutaneous TID WC  . insulin glargine  30 Units Subcutaneous QHS  . metoCLOPramide  5 mg Oral TID AC  . metoprolol tartrate  25 mg Oral BID  . multivitamin with minerals  1 tablet Oral Daily  . rOPINIRole  2 mg Oral BID  . simvastatin  10 mg Oral QHS  . sodium chloride  3 mL Intravenous Q12H  . sotalol  80 mg Oral BID   . heparin 1,900 Units/hr (09/12/14 0542)    ASSESSMENT AND PLAN: 1. Paroxysmal atrial fibrillation-the patient has recurrent atrial fibrillation in setting of acute pulmonary embolus. In reviewing his notes from [facility] he was seen in clinic in January 2016 and appeared to have been in sinus at that time. He has been in persistant afib since admission. He's asymptomatic.  -- On chronic coumadin. CHADS vasc is 3.  -- Elevated rates initially likely driven by his PE. Resting rate now better controlled on  AAD, BB and CCB. -- Currently on oral dilt 34m qid (1 dose held the last 2 days for unclear reasons), sotalol 87mbid, 25 mg of metoprolol BID and coumadin.  -- Not a good candidate for DCCV at this point with multiple stressors and increased risk of anesthesia with new PE. Will focus on rate control now. Patient may convert on Sotalol once stress of acute PE improves.  -- told RN not to hold rx unles parameters not met  2. Nonischemic cardiomyopathy- EF 40-45% -- He was on an ACE inhibitor at the time of admission. This is being held as his blood pressure is borderline.  -- Would resume at lower dose as blood pressure improves, but not yet.  -- SBP still in the low 100s at times and he needs rate control rx.   4. Bilateral lower extremity subacute DVT:  preliminary report on LE dopplers on 08/1914 shows: subacute DVT noted in the right common femoral, proximal femoral, and left common femoral veins.  -- Continue Coumadin once liver biopsy complete. Now on heparin.   5. Acute pulmonary embolus. Anticoagulation per primary team. Nearly occlusive thrombus in the left lower lobe pulmonary artery as well as several other thrombi throughout bilateral lungs. No evidence of right heart strain by CTA.  6. NSVT- tele with afib with frequent PVCs. No further runs on NSVT in past 24 hrs.  7. Malignancy of unknown primary with metastasis. Work up per primary team.   3. Chronic combined systolic/diastolic congestive heart failure-  -- 2D echo 02/2014 [facility] LVEF 40-45%, indeterminate diastolic function but evidence of elevated LA pressures, borderline reduced RV function. Echo here is similar with EF 45%. RV function reported as normal.  -- On IV Lasix. Diuresing well. -2 L out in past 24 hrs. -18.1L total since admit.  -- Still with significant volume overload with bilateral pitting edema. Renal function stable. K normal. Continue IV Lasix, 80 mg BID.  -- Strict I/Os, daily weights, and low  sodium diet.   8. Lower ext arterial dopplers difficult to obtain with edema but mild PAD stenosis on Lt -he has Lt foot pain and foot cool on Neurontin. Vascular following--After revewing his CT and supratherapeutic INR, this patient's presentation is highly suspicious for neoplasm related thrombophilia. - In this setting, any revascularization procedure is likely futile, as combined venous and arterial thrombosis is a presentation of end-stage of the neoplasm. - Additionally, this patient's history is not consistent with acute arterial occlusion given the relative lack of sx. His exam is of limited value due to pre-existing neurologic loss in that L foot. - Dopplers ordered, the patient and family want this done - Erythema to Left foot increasing, may be developing a cellulits as well, per IM  9. CAP- resolving per IM on ABX completed course   Otherwise, per IM Principal Problem:   Disseminated malignancy of unknown primary Active Problems:   IDDM (insulin dependent diabetes mellitus)   CAP (community acquired pneumonia)   HLD (hyperlipidemia)   BP (high blood pressure)   Gout of ankle   Atrial fibrillation   Pulmonary embolus   Signed, Rosaria Ferries , PA-C 11:31 AM 09/12/2014  Pt not in the room. He is down at the vascular lab then for liver biopsy. Cardiac status has been stable. I have reviewed telemetry and his atrial fib is well-controlled. I had a nice discussion with his family today. Would reinstitute warfarin after his liver biopsy as long as no further invasive procedures planned. We will follow from a distance unless any further acute cardiac issues arise. thx  Sherren Mocha 09/12/2014 1:30 PM

## 2014-09-12 NOTE — Progress Notes (Signed)
VASCULAR LAB PRELIMINARY  PRELIMINARY  PRELIMINARY  PRELIMINARY  Lower Extremity Arterial Duplex Left completed.    Preliminary report:  No arterial stenosis or occlusion in the arteries of the left lower extremity and contralateral common femoral and profunda artery.  Waveforms essentially unchanged from previous September 08, 2014 study.  August Albino, RVT 09/12/2014, 3:39 PM

## 2014-09-12 NOTE — Clinical Social Work Note (Addendum)
CSW contacted [facility] in  to confirm patient can come over the weekend if he is ready to discharge, [facility] said yes and to notify facility if he is ready over the weekend.  CSW to notify weekend coverage social workers, [facility] and DNR on chart awaiting signature by physician.  Jones Broom. [place], MSW, [place] 09/12/2014 5:28 PM

## 2014-09-12 NOTE — Progress Notes (Signed)
LOS: 9 days   Subjective:  Pt states that he is feeling better today because his pain is better controled with Dilaudid PRN.   Objective: BP 112/58 mmHg  Pulse 90  Temp(Src) 97.9 F (36.6 C) (Oral)  Resp 18  Ht 5\' 11"  (1.803 m)  Wt 134.2 kg (295 lb 13.7 oz)  BMI 41.28 kg/m2  SpO2 97%  Intake/Output Summary (Last 24 hours) at 09/12/14 1112 Last data filed at 09/12/14 0857  Gross per 24 hour  Intake   1092 ml  Output   4575 ml  Net  -3483 ml    Physical Exam: General: WDWN, healthy, cooperative, awake and alert, No acute distress. Lying in bed. O2 vial nasal canula at 5l/min Skin: Skin color, texture, turgor normal. No rashes or lesions. HEENT:  Head : Atraumatic, normocephalic. Eye: PERRL, sclera anicteric, no conjunctival injection Nose: no nasal congestion.patent, no discharge Throat: No tonsillar erythema, exudates or enlargement. Mouth: Moist mucus, no lesions, dentition good. Neck: supple, no LAD Heart: Pulse regular rate and rhythm, normal S1S2, no murmurs appreciated.  Chest/ Lung: Normal depth and effort.  No tenderness to palpation Lungs sounds:Diffuse crackles bilaterally, lung sounds diminished to left side.  No rales, rhonchi, wheezing, or rubs. Abdomen: Soft, NTND, +BS, no hepatosplenomegaly noted.  Extremities: LE edema + 2 left >> right . Left lower extremity red from the ankle to the knee. Warm bilaterally.    Labs/Studies: I have reviewed labs and studies from last 24hrs per EMR.  Medications: I have reviewed the patient's current medications.  Assessment/Plan: Principal Problem:   Disseminated malignancy of unknown primary Active Problems:   IDDM (insulin dependent diabetes mellitus)   CAP (community acquired pneumonia)   HLD (hyperlipidemia)   BP (high blood pressure)   Gout of ankle   Atrial fibrillation   Pulmonary embolus   Mr. Fluegel is a 74 yo male with an history of type 2 diabetes, hypertension, congestive heart  failure, hyperlipidemia, A-fib, and gout who presents with a productive cough and bilateral chest pain on inspiration have been treated for CAP with IV antibiotics for 7 days with no significant improvement of symptoms, CTA chest done on 09/08/14 was positive for bilateral PE and great suspicion for metastatic lung cancer. Pt started on IV unfractionate heparin. Antibiotherapy discontinued on 09/10/14.    # Metastatic lung cancer with left pleural effusion s/p thoracocentesis : CTA chest done on 09/08/14 reports "Evidence of metastatic malignancy with innumerable bilateral small pulmonary nodules as well as multiples liver lesions ". Unclear of the primary cancer location at this time. CT abdomen and pelvis done on 09/09/14 was not definite for identifying the primary cancer but with strong consideration for hepatocellular carcinoma. Liver biopsy was delayed pending INR below 1.5. Today's INR is 1.38. Biopsy will be performed this afternoon per interventional radiology.    -continuous o2 sat -O 2 supplementation. -Duoneb PRN -incentive spirometry. -Flutter valve -Mucinex BID.  # Bilateral PE: Pt remain stable, no respiratory distress, no abnormal bleeding from heparin infusion.   -continuous O 2 sat -Oxygen supplementation as needed. -continue unfractionated heparin IV. -Continue Duoneb PRN -Continue incentive spirometry Q 1 h.  -Continue Flutter valve  # Bilateral LE DVT with left leg redness:Pt continues treatment for DVT and PE with unfractionated heparin, left leg is still red and painful and requiring IV pain medication with Dilaudid. Pt due for repeat left lower extremity duplex today.  -LE arterial duplex.  -Continue unfractionated heparin. -Q4 neurovascular check. -continue monitoring.  -  TED hose to LE -Warm compresses.    # Atrial fibrillation :pt has chronic A-fib and he takes Cardizem 120 mg daily, Sotalol 80 mg BID at home.Pt has chronic atrial fibrillation and have  been tachycardic during this admission therefore Metoprolol was added for better rate control. Per chart review HR fluctuates between 69-112.  - appreciate cardiology consult. -Metoprolol 25 mg BID  - Continue home Sotalol -Continue Cardizem 30 mg Q6 h.  -monitor on telemetry  -coumadin stopped ( pt on Heparin drip)   # Congestive heart failure. Pt CTA showed a component of interstitial edema, pt still have LE edema and lung crackles on auscultation. Pt has documented systolic heart failure with last Echocardiogram showing an EF of 45-50 %.  - Continue lasix 80 mg BID.  -continue home diltiazem -hold home enalapril d/t BP still soft.  -strict I & O -Daily weight -fluid restriction.    # CAP: Resolved, pt has completed a 7 days of IV antibiotics.All antibiotics were stopped on 09/10/14 . WBC down to 16.9 from 17.9 yesterday. Pt still have mild non productive cough and hypoxia requiring oxygen supplementation. This symptoms are more likely related to pt current bilateral PE and lung malignancy.    # Diabetes type 2 : Pt home medication consists on Insuline Lantus 60 U at bed time and Novolin 30 U TID. He takes Gabapentin for his diabetes neuropathy and Reglan for gastroparesis. Pt had a couple of episode of hypoglycemia during this admission. Last one was on 09/07/14 at 49. She was already on a lower dose of basal insuline( 45 U at bedtime) and meal coverage Novolin 15 TID. This was reduced accordingly.Last CBG= 91. -CBG AC and HS  - continue sensitive SSI - Lantus at 30 U at bedtime - Novolog 10 U TID - gabapentin 600mg  QID - Reglan 5 mg before meals.   # Gout:  - continue home Colchicine  # Hypertension: pt is normotensive for the most part. BP: 105/58-131/60 -Continue Diltiazem 30 mg Q 6 h -Continue home Sotalol -Start lasix IV 80 mg BID   # Hyperlipidemia :  -Continue home Zocor 10 mg.    PPx: IV heparin  Diet: Carb modified diet  Dispo:  Disposition is deferred at this time, awaiting improvement of current medical problems. Anticipated discharge in approximately 1 day(s).   This is a Careers information officer Note. The care of the patient was discussed with Dr. Raelene Bott and the assessment and plan formulated with their assistance. Please see their attached note for official documentation of the daily encounter.

## 2014-09-12 NOTE — Clinical Social Work Placement (Addendum)
Clinical Social Work Department CLINICAL SOCIAL WORK PLACEMENT NOTE 09/12/2014  Patient:  Phillip Meyer, Phillip Meyer  Account Number:  [id number] Admit date:  09/09/2014  Clinical Social Worker:  Sharlet Notaro, LCSWA  Date/time:  09/11/2014 05:21 PM  Clinical Social Work is seeking post-discharge placement for this patient at the following level of care:   SKILLED NURSING   (*CSW will update this form in Epic as items are completed)   09/11/2014  Patient/family provided with [facility] Department of Clinical Social Work's list of facilities offering this level of care within the geographic area requested by the patient (or if unable, by the patient's family).  09/11/2014  Patient/family informed of their freedom to choose among providers that offer the needed level of care, that participate in Medicare, Medicaid or managed care program needed by the patient, have an available bed and are willing to accept the patient.  09/11/2014  Patient/family informed of [facility]' ownership interest in [facility], as well as of the fact that they are under no obligation to receive care at this facility.  PASARR submitted to EDS on 09/11/2014 PASARR number received on 09/11/2014  FL2 transmitted to all facilities in geographic area requested by pt/family on  09/11/2014 FL2 transmitted to all facilities within larger geographic area on 09/11/2014  Patient informed that his/her managed care company has contracts with or will negotiate with  certain facilities, including the following:     Patient/family informed of bed offers received:  09/11/2014 Patient chooses bed at [facility] [place] recommends and patient chooses bed at    Patient to be transferred to  on   Patient to be transferred to facility by  Patient and family notified of transfer on  Name of family member notified:    The following physician request were entered in Epic: Physician Request   Please sign FL2.    Additional Comments:  Jones Broom. [place], MSW, [place] 09/12/2014 5:26 PM

## 2014-09-12 NOTE — Progress Notes (Signed)
Internal Medicine Attending  Date: 09/12/2014  Patient name: Phillip Meyer Medical record number: [id number] Date of birth: [birth year] Age: 74 y.o. Gender: male  I saw and evaluated the patient. I reviewed the resident's note by Dr. Raelene Bott and I agree with the resident's findings and plans as documented in his progress note.  Left lower extremity with erythema and ? small areas of skin necrosis on shin and medial aspect of big toe.  No splinter hemorrhages, palpable purpura, or warmth and joints do not appear to be involved.  O2 sat 91-95% when O2 lowered to 3 L/min by nasal cannula.  Agree LE lesion likely inflammatory secondary to underlying venous thrombosis with the differential including a paraneoplastic syndrome.  Symptomatic management is likely best and he has responded to dilaudid.  With INR less than 1.5 he will likely undergo biopsy of one of the liver lesions to determine type of presumed cancer and thus allow for the determination of the best therapy, if any, moving forward.

## 2014-09-12 NOTE — Progress Notes (Signed)
PT Cancellation Note  Patient Details Name: Phillip Meyer MRN: [id number] DOB: [birth year]   Cancelled Treatment:    Reason Eval/Treat Not Completed: Patient at procedure or test/unavailable. Patient going for liver biopsy and LLE ultrasound. Declined therapy at this time.    [name] 09/12/2014, 12:43 PM

## 2014-09-12 NOTE — Progress Notes (Addendum)
ANTICOAGULATION CONSULT NOTE - Follow Up Consult  Pharmacy Consult:  Heparin Indication:  History of AFib and DVT + new PEs and DVTs  Allergies  Allergen Reactions  . Elavil [Amitriptyline] Other (See Comments)    Passes out    Patient Measurements: Height: 5\' 11"  (180.3 cm) Weight: 295 lb 13.7 oz (134.2 kg) IBW/kg (Calculated) : 75.3 Heparin Dosing Weight: 105 kg  Vital Signs: Temp: 97.9 F (36.6 C) (03/25 0522) Temp Source: Oral (03/25 0522) BP: 112/58 mmHg (03/25 0522) Pulse Rate: 90 (03/25 0522)  Labs:  Recent Labs  09/10/14 0524  09/10/14 1010 09/11/14 0450 09/12/14 0445 09/12/14 0500  HGB 13.2  --   --  12.2*  --   --   HCT 40.1  --   --  38.7*  --   --   PLT 261  --   --  302  --   --   LABPROT 23.1*  --   --  19.7* 17.1*  --   INR 2.03*  --   --  1.65* 1.38  --   HEPARINUNFRC  --   < > 0.36 0.54  --  0.68  CREATININE 0.71  --   --  0.64  --   --   < > = values in this interval not displayed.  Estimated Creatinine Clearance: 113.3 mL/min (by C-G formula based on Cr of 0.64).    Assessment: 89 YOM on Coumadin PTA for history of Afib and DVT in November of 2015.  Patient's INRs have been therapeutic or supra-therapeutic and found to have new PEs and DVTs that extended throughout the left leg.  Currently on IV heparin and heparin level is therapeutic.  Noted documentation of yielding bloody fluid during thoracentesis on 09/10/14.  No overt bleeding documented.  Goal of Therapy:  Heparin level 0.3-0.7 units/ml Monitor platelets by anticoagulation protocol: Yes     Plan:  - Decrease heparin gtt slightly to 1850 units/hr - Daily HL / CBC - F/U post biopsy and consider starting Lovenox post-op (135mg  SQ Q12H)   Thuy D. Mina Marble, PharmD, BCPS Pager:  [phone] 09/12/2014, 12:41 PM    Addendum Pt s/p liver biopsy.  Pharmacy consulted to dose LMWH for PE/DVT/Afib in setting of newly diagnosed malignancy.  Requested to start at 8 pm tonight. Plan to DC pt to  SNF for short term rehab.    Plan: LMWH 120 mg sq q12h - dose chosen for convenience of syringe size and s/p liver biopsy.  Once further out from biopsy, could change to 200 mg sq q24 if desired.  Eudelia Bunch, Pharm.D. [phone] 09/12/2014 5:58 PM  2nd Addendum:  To resume heparin as he may need to have more invasive procedures. Plan: resume heparin drip with no bolus at previous therapeutic rate of 1900 units/hr and check am HL and CBC  Eudelia Bunch, Pharm.D. [phone] 09/12/2014 7:27 PM

## 2014-09-12 NOTE — Consult Note (Signed)
[place]  [phone]   Reiffton Dinneen  DOB: 08/06/1940  MR#: [id number]  CSN#: [id number]    Requesting Physician: [facility]    Patient Care Team: Zorita Pang, MD as PCP - General (Unknown Physician Specialty)  Reason for Consut: Metastatic Cancer     HPI: 74 y.o.  male  With a history of atrial fibrillation as well as Prior DVT back in November 2015, after a lumbar procedure,on Coumadin since then,  admitted on 09/13/2014 with progressive shortness of breath and chest pain. On presentation, he also had increasing leg swelling for about 5 weeks. He denies any fever or chills or night sweats. He denies any abdominal pain, nausea or vomiting. He denies any appetite changes. He denies any bleeding issues such as hematochezia, hematuria, hemoptysis or epistaxis. He denies any headaches, dizziness or seizures. No confusion was reported. During this admission, a lower extremity Doppler was positive for right common femoral DVT, proximal femoral and left common femoral vein thrombosis. CT angiogram was also positive for bilateral PE. While on anticoagulation, therapeutic INR, and make an compliance. Risk factors for these clotting issues, until this admission had been as recent surgery, decreased mobility, and gaps in anticoagulation. The patient denies any recent long distance trips, or hormonal replacements.  Incidentally, this CT angio of the chest on 3/21showed innumerable bilateral small pulmonary nodules, and multiple liver lesions with a 11 cm mass in the right lower lobe of the liver. CT of the abdomen and pelvis confirmed these findings. Of note, the liver morphology suggests cirrhosis. Moderate enlargement of lymph nodes in the deep liver drainage, with the portacaval node measuring 14 x 28 mm was noted. Noosseous metastatic disease was found.  No family history of GI malignancies. Denies risk factors for HIV or  hepatitis (This is  lab proven). He is diabetic. He never usedTobacco. Denies a history of heavy alcohol intake. Denies prior diagnosis of cancer. Denies pruritus. He is not up to date with screening programs. He never had a colonoscopy. Patient underwent liver biopsy of the lesion today, with results pending.  No tumor markers are available for review. LDH is normal at 386. Based on these findings, we asked to see the patient in consultation with recommendations.   Past medical history:      Past Medical History  Diagnosis Date  . Hypertension   . CHF (congestive heart failure)   . Arthritis   . Atrial fibrillation   . Left foot drop     wears brace  . Hyperlipidemia   . History of snoring     pt had sleep study but does not have sleep apnea  . Atrial fibrillation   . Pneumonia 08/26/2014  . 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 Disseminated malignancy of unknown primary 09/09/2014   Past surgical history:      Past Surgical History  Procedure Laterality Date  . Back surgery      x6  . Cardiac catheterization      [facility]  . Laminectomy with posterior lateral arthrodesis level 1 N/A 03/27/2013    Procedure: REEXPLORATION OF LUMBAR FUSION WITH REPLACEMENT OF SACRAL SCREWS AND ILIAC CREST BONE GRAFT REDO DECOMPRESSION LAMINECTOMY LUMBAR FIVE SACRAL ONE;  Surgeon: Elaina Hoops, MD;  Location: [place] [facility];  Service: Neurosurgery;  Laterality: N/A;  . Hardware removal N/A 10/16/2013    Procedure: Lumbar Hardware Removal;  Surgeon: Julien Girt  Saintclair Halsted, MD;  Location: [place] [facility];  Service: Neurosurgery;  Laterality: N/A;    Medications:  Prior to Admission:  Prescriptions prior to admission  Medication Sig Dispense Refill Last Dose  . APPLE CIDER VINEGAR PO Take 200 mg by mouth 2 (two) times daily.   09/01/2014 at Unknown time  . aspirin EC 81 MG tablet Take 81 mg by mouth daily.   09/18/2014 at Unknown time  . colchicine 0.6 MG tablet Take 0.6 mg by mouth 4 (four)  times daily as needed (gout).    09/02/2014 at Unknown time  . diltiazem (CARDIZEM CD) 120 MG 24 hr capsule Take 120 mg by mouth daily.   09/18/2014 at Unknown time  . enalapril (VASOTEC) 20 MG tablet Take 20 mg by mouth daily.    08/20/2014 at Unknown time  . ferrous sulfate 325 (65 FE) MG tablet Take 325 mg by mouth daily with breakfast.   08/19/2014 at Unknown time  . furosemide (LASIX) 40 MG tablet Take 80-120 mg by mouth 2 (two) times daily. 3 in the morning and 2 tablets at dinner   09/02/2014 at Unknown time  . gabapentin (NEURONTIN) 600 MG tablet Take 600 mg by mouth 2 (two) times daily.    09/02/2014 at Unknown time  . insulin glargine (LANTUS) 100 UNIT/ML injection Inject 60 Units into the skin at bedtime.   09/02/2014 at Unknown time  . insulin regular (NOVOLIN R,HUMULIN R) 100 units/mL injection Inject 30 Units into the skin 3 (three) times daily before meals.   09/11/2014 at Unknown time  . meclizine (ANTIVERT) 25 MG tablet Take 25 mg by mouth 2 (two) times daily.    09/02/2014 at Unknown time  . metoCLOPramide (REGLAN) 5 MG tablet Take 5 mg by mouth 3 (three) times daily before meals.   08/28/2014 at Unknown time  . Multiple Vitamins-Minerals (MULTIVITAMIN WITH MINERALS) tablet Take 1 tablet by mouth daily.   09/02/2014 at Unknown time  . Omega-3 Fatty Acids (FISH OIL) 1200 MG CAPS Take 1 capsule by mouth 2 (two) times daily.   08/24/2014 at Unknown time  . OVER THE COUNTER MEDICATION Take 2-3 tablets by mouth daily. Equate brand stool softener.  Take 2 tablets one day and 3 tablets the next day.   Past Week at Unknown time  . Oxycodone HCl 10 MG TABS Take 10 mg by mouth daily as needed (pain).    Past Month at Unknown time  . predniSONE (STERAPRED UNI-PAK) 10 MG tablet 4 tabs x 2 days, 3 tabs x 2 days, 2 tabs x 2 days, then 1 tab x 2 days   not yet started  . rOPINIRole (REQUIP) 2 MG tablet Take 2 mg by mouth 2 (two) times daily.   09/11/2014 at Unknown time  . simvastatin (ZOCOR) 10 MG tablet Take  10 mg by mouth at bedtime.   09/02/2014 at Unknown time  . sotalol (BETAPACE) 80 MG tablet Take 80 mg by mouth 2 (two) times daily.   09/01/2014 at 800  . traMADol (ULTRAM) 50 MG tablet Take 2 tablets (100 mg total) by mouth every 6 (six) hours as needed for moderate pain or severe pain. 30 tablet 0 09/02/2014 at Unknown time  . warfarin (COUMADIN) 5 MG tablet Take 2.5-5 mg by mouth daily. 2.5 mg on T, Thu, and Sat and 5 mg on the other days   08/31/2014           HWE:[id number], ipratropium-albuterol, sodium chloride, traMADol  Allergies:  Allergies  Allergen Reactions  . Elavil [Amitriptyline] Other (See Comments)    Passes out    Family history:  Remarkable for mother who had multiple myeloma, treated at the [facility] by Dr. Anabel Bene No other history of oncological disease in the family       Social history:  Patient is widowed, he has 1 son who is  ROS: See history of present illness for significant positives, the rest of the review of systems is negative.  Physical Exam   Filed Vitals:   09/12/14 0522  BP: 112/58  Pulse: 90  Temp: 97.9 F (36.6 C)  Resp: 18   Filed Weights   09/08/14 0500 09/09/14 0519 09/10/14 0540  Weight: 289 lb 7.4 oz (131.3 kg) 293 lb 10.4 oz (133.2 kg) 295 lb 13.7 oz (134.2 kg)    GENERAL:alert, no distress and comfortable SKIN: Remarkable for some erythema around the left ankle area, Likely left foot cellulitis. Marland KitchenEYES: normal, conjunctiva are pink and non-injected, sclera clear OROPHARYNX:no exudate, no erythema and lips, buccal mucosa, and tongue normal  NECK: supple, thyroid normal size, non-tender, without nodularity LYMPH:  no palpable lymphadenopathy in the cervical, axillary or inguinal LUNGS: Remarkable for bilateral crackles at the base with normal breathing effort HEART: regular rate & rhythm and no murmurs and 2+Bilateral lower extremity edema ABDOMEN:abdomen soft, non-tender and normal bowel  sounds Musculoskeletal:no cyanosis of digits and no clubbing  PSYCH: alert & oriented x 3 with fluent speech NEURO: no focal motor/sensory deficits Other than known left foot drop.   Lab results:       CBC  Recent Labs Lab 09/07/14 0429 09/08/14 0530 09/09/14 0405 09/10/14 0524 09/11/14 0450  WBC 14.7* 15.8* 17.4* 17.9* 16.9*  HGB 12.3* 13.1 12.1* 13.2 12.2*  HCT 38.4* 41.3 37.3* 40.1 38.7*  PLT 231 197 217 261 302  MCV 95.5 97.2 94.7 93.9 95.8  MCH 30.6 30.8 30.7 30.9 30.2  MCHC 32.0 31.7 32.4 32.9 31.5  RDW 14.0 14.2 14.3 14.3 14.2    Anemia panel:  No results for input(s): VITAMINB12, FOLATE, FERRITIN, TIBC, IRON, RETICCTPCT in the last 72 hours.   Chemistries   Recent Labs Lab 09/07/14 0429 09/08/14 0530 09/09/14 0405 09/10/14 0524 09/11/14 0450  NA 137 137 136 133* 134*  K 3.6 4.0 3.8 4.9 4.1  CL 90* 87* 90* 90* 90*  CO2 37* 39* 35* 31 36*  GLUCOSE 130* 98 98 119* 86  BUN 16 16 23 19 23   CREATININE 0.65 0.73 0.69 0.71 0.64  CALCIUM 8.5 9.0 8.5 8.6 8.3*     Coagulation profile  Recent Labs Lab 09/08/14 0530 09/09/14 0405 09/10/14 0524 09/11/14 0450 09/12/14 0445  INR 3.54* 2.97* 2.03* 1.65* 1.38    Urine Studies No results for input(s): UHGB, CRYS in the last 72 hours.  Invalid input(s): UACOL, UAPR, USPG, UPH, UTP, UGL, UKET, UBIL, UNIT, UROB, ULEU, UEPI, UWBC, URBC, UBAC, CAST, UCOM, BILUA  Studies:      Dg Chest 1 View  09/10/2014   CLINICAL DATA:  Post left thoracentesis.  EXAM: CHEST  1 VIEW  COMPARISON:  09/07/2014  FINDINGS: There are low lung volumes with bibasilar opacities, likely atelectasis. Decreasing left effusion post thoracentesis. No pneumothorax. Cardiomegaly with mild vascular congestion.  IMPRESSION: Decreasing left effusion following thoracentesis.  No pneumothorax.  Low lung volumes with bibasilar atelectasis.   Electronically Signed   By: Rolm Baptise M.D.   On: 09/10/2014 12:54   Dg Chest 2 View  09/07/2014  CLINICAL DATA:  Dyspnea.  Recent diagnosis of pneumonia.  EXAM: CHEST  2 VIEW  COMPARISON:  08/24/2014.  FINDINGS: There is persistent opacity in the lung bases, greater on the left, now mostly obscuring the left heart border and left hemidiaphragm on the frontal view. On the lateral view, left lung base opacity projects in the left upper lobe lingula and left lower lobe. Mild streaky opacity in the right lung base is stable.  No other areas of lung consolidation. No pulmonary edema. There is no convincing pleural effusion. No pneumothorax.  Heart is mildly enlarged.  IMPRESSION: 1. Slight changes in the appearance of the lung base opacity on the left is likely due to larger lung volumes on the current exam and differences in radiographic technique and positioning. Overall, there has been no convincing change from the previous study with persistent left upper lobe lingular and left lower lobe consolidation and milder opacity at the right lung base. Right basilar opacity may all be atelectasis or additional infiltrate or a combination.   Electronically Signed   By: Lajean Manes M.D.   On: 09/07/2014 09:33   Dg Chest 2 View  08/25/2014   CLINICAL DATA:  Bilateral chest pain.  Shortness of breath.  EXAM: CHEST - 2 VIEW  COMPARISON:  Two-view chest x-ray 10/11/2013.  FINDINGS: The heart is enlarged. Lung volumes are low. Bilateral middle lobe and lingular airspace disease is present. No definite effusions are present. There is likely some disease in the left lower lobe as well.  IMPRESSION: 1. New bilateral airspace disease, predominately within the lingula and right middle lobe. This is most concerning for pneumonia. 2. Mild pulmonary vascular congestion is likely reactive. 3. Mild lower lobe airspace disease is present as well.   Electronically Signed   By: San Morelle M.D.   On: 08/26/2014 11:05   Ct Angio Chest Pe W/cm &/or Wo Cm  09/08/2014   CLINICAL DATA:  Right lower extremity DVT, shortness of  breath and hypoxia.  EXAM: CT ANGIOGRAPHY CHEST WITH CONTRAST  TECHNIQUE: Multidetector CT imaging of the chest was performed using the standard protocol during bolus administration of intravenous contrast. Multiplanar CT image reconstructions and MIPs were obtained to evaluate the vascular anatomy.  CONTRAST:  49m OMNIPAQUE IOHEXOL 350 MG/ML SOLN  COMPARISON:  Chest x-ray on 09/07/2014  FINDINGS: There is evidence of bilateral pulmonary embolism. Nearly occlusive thrombus is identified in the left lower lobe pulmonary artery. Nonocclusive thrombus is identified in the right lower lobe pulmonary artery with some occlusive component extending into a lateral basilar subsegmental arterial branch. There is some small nonocclusive thrombus in the left upper lobe and right upper lobe. There is associated moderate left pleural effusion. There is airspace disease in the left lower lobe. Component of pneumonia cannot be excluded. Peripheral opacity in the lateral basilar right lower lobe measures roughly 2.0 x 2.7 cm this is in the distribution of occlusive pulmonary embolism and may represent a developing pulmonary infarct.  Innumerable small pulmonary nodules are seen throughout both lungs. The largest discrete nodule in the right lung is a 9 mm right middle lobe nodule. The largest nodule in the left lung is an 8 mm left upper lobe nodule. Masses could be obscured in the left lower lobe due to airspace disease and compressive atelectasis from the pleural effusion. No grossly enlarged lymph nodes are identified in the chest. Interstitial and pulmonary vascular prominence may be consistent with interstitial edema.  No pericardial fluid is seen. The  heart size is normal. Extensive coronary arterial calcifications are seen in a 3 vessel distribution. No evidence of significant right heart strain related to pulmonary embolism with RV to LV diameter ratio of 0.86.  The visualized upper abdomen shows multiple lesions in both  lobes of the liver. A large geographic lesion in the superior right lobe measures nearly 11 cm in estimated diameter. Separate 2.7 cm and 5.5 cm left lobe lesions are also visualized. The very last image also shows probable periportal/peripancreatic lymphadenopathy. CT imaging of the abdomen and pelvis is recommended for further evaluation. No focal bony lesions are identified. There is spondylosis in the lower thoracic spine  Review of the MIP images confirms the above findings.  IMPRESSION: 1. Bilateral pulmonary embolism with nearly occlusive thrombus in the left lower lobe pulmonary artery and occlusive component of thrombus in the lateral basilar segmental branch of the right lower lobe. Nonocclusive thrombus is present in both upper lobes. No evidence of right heart strain by CTA. 2. There may be a component of interstitial edema. There also may be additional airspace disease in the left lower lung and there is a moderate left pleural effusion. 3. Evidence of metastatic malignancy with innumerable bilateral small pulmonary nodules identified as well as multiple liver lesions in the visualized liver. This includes a nearly 11 cm mass in the right lobe of the liver. Etiology of malignancy is not clear and further evaluation of the abdomen and pelvis is recommended by CT with contrast.  Results were called by telephone at the time of interpretation on 09/08/2014 at 1:51 pm to Dr. Luan Moore , who verbally acknowledged these results.   Electronically Signed   By: Aletta Edouard M.D.   On: 09/08/2014 13:54   Ct Abdomen Pelvis W Contrast  09/10/2014   ADDENDUM REPORT: 09/10/2014 10:22  ADDENDUM: Low-density expansion of the pancreatic tail which could reflect a pancreas adenocarcinoma. Planned liver biopsy will likely clarify this finding.   Electronically Signed   By: Monte Fantasia M.D.   On: 09/10/2014 10:22   09/10/2014   CLINICAL DATA:  Question cancer.  Multiple myeloma.  EXAM: CT ABDOMEN AND PELVIS  WITH CONTRAST  TECHNIQUE: Multidetector CT imaging of the abdomen and pelvis was performed using the standard protocol following bolus administration of intravenous contrast.  CONTRAST:  197m OMNIPAQUE IOHEXOL 300 MG/ML  SOLN  COMPARISON:  02/18/2014  FINDINGS: BODY WALL: Medication injection site in the left lower quadrant wall.  There is expansion of the subcutaneous fat and the left thigh muscles related to DVT. Bilateral DVT is visible, up to the common femoral veins.  LOWER CHEST: There is a small left pleural effusion which is stable from previous. No appreciable change in bilateral lower lobe pulmonary embolism and lung opacity.  There is a up to 28 mm lobulated mass along the right atrial wall, presumably a metastasis.  ABDOMEN/PELVIS:  Liver: There are numerous small hypo enhancing liver masses throughout the liver, with a confluent ill-defined mass in the right lobe measuring 10 cm. The other largest mass is in the segment 2 at 5 cm.  Liver morphology suggests cirrhosis, likely present before the metastases given fissure enlargement.  Biliary: Cholelithiasis.  No evidence of obstruction.  Pancreas: Unremarkable.  Spleen: Unremarkable.  Adrenals: Unremarkable.  Kidneys and ureters: No hydronephrosis or stone.  Bladder: Unremarkable.  Reproductive: No pathologic findings.  Bowel: No obstruction. No gross mass lesion.  Retroperitoneum: Moderate enlargement of lymph nodes in the deep liver drainage, with the  portacaval node measuring 14 x 28 mm.  Peritoneum: No ascites or pneumoperitoneum.  Vascular: No acute abnormality.  OSSEOUS: No metastasis identified. There is advanced and diffuse degenerative disc and facet disease status post extensive posterior spinal fusion from T12-L5. Prior sacral fusion has been removed. There is been multilevel discectomy, including at L5-S1 where there is pseudoarthrosis.  IMPRESSION: 1. Hepatic metastatic disease and hepatic drainage adenopathy. A primary malignancy is not  definitively identified, but given cirrhosis and a dominant right liver mass, hepatocellular carcinoma is a strong consideration. 2. Right atrial filling defect, appearance and clinical circumstances favors metastasis over thrombus. Cardiac MRI could differentiate if clinically needed. 3. Known bilateral pulmonary embolism. There is DVT with clot into the bilateral common femoral veins. Venous obstructive changes noted in the left thigh.  Electronically Signed: By: Monte Fantasia M.D. On: 09/09/2014 19:12   US Thoracentesis Asp Pleural Space W/img Guide  09/10/2014   CLINICAL DATA:  Pulmonary embolus. Left pleural effusion. Request for diagnostic and therapeutic thoracentesis.  EXAM: ULTRASOUND GUIDED LEFT THORACENTESIS  COMPARISON:  None.  PROCEDURE: An ultrasound guided thoracentesis was thoroughly discussed with the patient and questions answered. The benefits, risks, alternatives and complications were also discussed. The patient understands and wishes to proceed with the procedure. Written consent was obtained.  Ultrasound was performed to localize and mark an adequate pocket of fluid in the left chest. The area was then prepped and draped in the normal sterile fashion. 1% Lidocaine was used for local anesthesia. Under ultrasound guidance a 19 gauge Yueh catheter was introduced. Thoracentesis was performed. The catheter was removed and a dressing applied.  COMPLICATIONS: None immediate  FINDINGS: A total of approximately 430 mL of bloody pleural fluid was removed. A fluid sample wassent for laboratory analysis.  IMPRESSION: Successful ultrasound guided left thoracentesis yielding 430 mL of pleural fluid.  Read by: Ascencion Dike PA-C   Electronically Signed   By: Markus Daft M.D.   On: 09/10/2014 12:00    Assessment/Plan:74 y.o. male  with   Metastatic lesions of unknown primary. CT angiography of the chest on 09/08/2014 showed evidence of metastatic malignancy with innumerable bilateral small pulmonary  nodules, as well as multiple liver lesions. CT of the abdomen and pelvis on 09/09/2014 confirmed the above findings, with the liver lesions suspicious for hepatocellular carcinoma. His liver biopsy was delayed due to supratherapeutic INR. This is to take place today by IR. We will await for results, we will proceed with further recommendations. Patient would prefer to follow up as outpatient at the [facility] for geographic convenience.   Bilateral PE Current DVT In the setting of malignancy, recent surgery, and changes in his anticoagulants in the recent past. At some point, the patient was placed Eliquis after the recent surgery, which resulted in bleeding, for which he was replaced on Lovenox and then to Coumadin again. It is unclear if these gaps and bridges played a role. The patient was on Coumadin prior to admission, and he was compliant with the medication. On admission, he was placed on IV heparin by pharmacy which eventually is to be transitioned to Lovenox and to oral agent. Appreciate pharmacy involvement.  Postobstructive pneumonia The patient has been treated with antibiotics IV, nebulizers and oxygen. He is improving.  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  DNR  Other medical issues Including atrial fibrillation, CHF, Diabetes, gout,Hypertension and hyperlipidemia as per admitting team.  Rondel Jumbo, PA-C 09/12/2014  ADDENDUM:  I saw and examined Mr. Ginsberg. He is a very nice guy.  He has metastatic disease. He had a liver biopsy today. One would have to think that this is going to be metastatic to the liver. His outflow fetoprotein was normal. He never has had a colonoscopy but he is not anemic so I don't think that this would be a primary colonic tumor.  He's never smoked. I still think that he may have lung cancer. He appears to have some pleural effusion over  on the left side. It might be necessary to drain this. There is not a lot of fluids so this can be watched.  I sent off a CEA level on him and also sent off a CA 19-9.  He has no obvious occupational exposures. He was a Administrator.  The other big problem that he has is this hypercoagulable condition. He definitely is hypercoagulable from his underlying malignancy. For now, I really would prefer that he be kept on heparin for another for 5 days. I think this would be the best way to coagulate him right now. It is certainly conceivable that he may need additional procedures while he is in the hospital.  I really would NOT recommend him being discharged until at least Tuesday. I think we will MUST have a tissue diagnosis before he leaves. This would be a lot easier for [place] and for him. I think it would not be proper to let him go and not have a confirmed tissue diagnosis.  I am a firm believer that heparin is a very effective anticoagulant for malignancy associated thromboembolic disease. However, I think that he would benefit from a good 4-5 days of heparin. Again, he may need additional studies.  If he has adenocarcinoma, I probably would recommend a colonoscopy and upper endoscopy since he's not had one. It would be much easier just to stop heparin and not have to wait for Lovenox to get out of his system.  He lives in [place]. I think that once we confirm malignancy, we can have him follow-up at the [facility]. He knows the doctors there. His wife died of cancer 3 years ago and within care by one of the oncologists in [place].  I spent about 1 hour with him. He is very nice. He has a strong faith.  We will follow along.  Pete e.  Luke 23:46

## 2014-09-12 NOTE — Progress Notes (Signed)
Subjective:  Patient is reporting that his left ankle and leg pain are now under better control after switching to Dilaudid yesterday. Patient continues to not be able to ambulate on the foot.  Objective: Vital signs in last 24 hours: Filed Vitals:   09/11/14 1423 09/11/14 1712 09/11/14 2129 09/12/14 0522  BP: 115/62 126/56 131/60 112/58  Pulse: 103 112 108 90  Temp: 97.7 F (36.5 C)  98.5 F (36.9 C) 97.9 F (36.6 C)  TempSrc: Oral  Oral Oral  Resp: 18  18 18   Height:      Weight:      SpO2: 95%  95% 97%   Weight change:   Intake/Output Summary (Last 24 hours) at 09/12/14 3354 Last data filed at 09/12/14 0857  Gross per 24 hour  Intake   1288 ml  Output   4575 ml  Net  -3287 ml    General: resting in chair, in no acute distress, nasal cannula in place at 4.5 L Cardiac: Irregularly irregular, normal rate, no rubs, murmurs or gallops Pulm: Rales most remarkable in the left lung base, moving normal volumes of air Abd: soft, nontender, nondistended, BS present Ext: Left extremity with improved warmth, diffuse erythema from the dorsal foot through the ankle superior to just below the knee, multiple necrotic foci the 2 most remarkable of which are on the lateral aspect of the calf and medial aspect of the hallux, no cutaneous drainage, stable from yesterday. 2+ bilateral lower extremity edema Neuro: alert and oriented X3, cranial nerves II-XII grossly intact Psych: appropriate affect and cognition  Lab Results: Basic Metabolic Panel:  Recent Labs Lab 09/10/14 0524 09/11/14 0450  NA 133* 134*  K 4.9 4.1  CL 90* 90*  CO2 31 36*  GLUCOSE 119* 86  BUN 19 23  CREATININE 0.71 0.64  CALCIUM 8.6 8.3*   Liver Function Tests: No results for input(s): AST, ALT, ALKPHOS, BILITOT, PROT, ALBUMIN in the last 168 hours. No results for input(s): LIPASE, AMYLASE in the last 168 hours. No results for input(s): AMMONIA in the last 168 hours. CBC:  Recent Labs Lab  09/10/14 0524 09/11/14 0450  WBC 17.9* 16.9*  HGB 13.2 12.2*  HCT 40.1 38.7*  MCV 93.9 95.8  PLT 261 302   Cardiac Enzymes: No results for input(s): CKTOTAL, CKMB, CKMBINDEX, TROPONINI in the last 168 hours. BNP: No results for input(s): PROBNP in the last 168 hours. D-Dimer: No results for input(s): DDIMER in the last 168 hours. CBG:  Recent Labs Lab 09/10/14 2121 09/11/14 0732 09/11/14 1152 09/11/14 1708 09/11/14 2131 09/12/14 0732  GLUCAP 138* 82 130* 225* 213* 91   Coagulation:  Recent Labs Lab 09/09/14 0405 09/10/14 0524 09/11/14 0450 09/12/14 0445  LABPROT 31.1* 23.1* 19.7* 17.1*  INR 2.97* 2.03* 1.65* 1.38    Micro Results: Recent Results (from the past 240 hour(s))  Culture, blood (routine x 2) Call MD if unable to obtain prior to antibiotics being given     Status: None   Collection Time: 09/18/2014  6:46 PM  Result Value Ref Range Status   Specimen Description BLOOD RIGHT ANTECUBITAL  Final   Special Requests BOTTLES DRAWN AEROBIC AND ANAEROBIC 5CC  Final   Culture   Final    NO GROWTH 5 DAYS Performed at [facility]    Report Status 09/10/2014 FINAL  Final  Culture, blood (routine x 2) Call MD if unable to obtain prior to antibiotics being given     Status: None  Collection Time: 09/02/2014  6:57 PM  Result Value Ref Range Status   Specimen Description BLOOD RIGHT HAND  Final   Special Requests BOTTLES DRAWN AEROBIC ONLY 3CC  Final   Culture   Final    NO GROWTH 5 DAYS Performed at [facility]    Report Status 09/10/2014 FINAL  Final  Body fluid culture     Status: None (Preliminary result)   Collection Time: 09/10/14 11:46 AM  Result Value Ref Range Status   Specimen Description FLUID PLEURAL LEFT  Final   Special Requests NONE  Final   Gram Stain   Final    NO WBC SEEN NO ORGANISMS SEEN Performed at [facility]    Culture   Final    NO GROWTH 1 DAY Performed at [facility]    Report Status PENDING   Incomplete   Studies/Results: Dg Chest 1 View  09/10/2014   CLINICAL DATA:  Post left thoracentesis.  EXAM: CHEST  1 VIEW  COMPARISON:  09/07/2014  FINDINGS: There are low lung volumes with bibasilar opacities, likely atelectasis. Decreasing left effusion post thoracentesis. No pneumothorax. Cardiomegaly with mild vascular congestion.  IMPRESSION: Decreasing left effusion following thoracentesis.  No pneumothorax.  Low lung volumes with bibasilar atelectasis.   Electronically Signed   By: Rolm Baptise M.D.   On: 09/10/2014 12:54   US Thoracentesis Asp Pleural Space W/img Guide  09/10/2014   CLINICAL DATA:  Pulmonary embolus. Left pleural effusion. Request for diagnostic and therapeutic thoracentesis.  EXAM: ULTRASOUND GUIDED LEFT THORACENTESIS  COMPARISON:  None.  PROCEDURE: An ultrasound guided thoracentesis was thoroughly discussed with the patient and questions answered. The benefits, risks, alternatives and complications were also discussed. The patient understands and wishes to proceed with the procedure. Written consent was obtained.  Ultrasound was performed to localize and mark an adequate pocket of fluid in the left chest. The area was then prepped and draped in the normal sterile fashion. 1% Lidocaine was used for local anesthesia. Under ultrasound guidance a 19 gauge Yueh catheter was introduced. Thoracentesis was performed. The catheter was removed and a dressing applied.  COMPLICATIONS: None immediate  FINDINGS: A total of approximately 430 mL of bloody pleural fluid was removed. A fluid sample wassent for laboratory analysis.  IMPRESSION: Successful ultrasound guided left thoracentesis yielding 430 mL of pleural fluid.  Read by: Ascencion Dike PA-C   Electronically Signed   By: Markus Daft M.D.   On: 09/10/2014 12:00   Medications: I have reviewed the patient's current medications. Scheduled Meds: . antiseptic oral rinse  7 mL Mouth Rinse BID  . colchicine  0.6 mg Oral Daily  .  dextromethorphan-guaiFENesin  1 tablet Oral BID  . diltiazem  30 mg Oral 4 times per day  . docusate sodium  200 mg Oral Daily  . ferrous sulfate  325 mg Oral Q breakfast  . furosemide  80 mg Intravenous BID  . gabapentin  600 mg Oral BID  . insulin aspart  0-15 Units Subcutaneous TID WC  . insulin aspart  0-5 Units Subcutaneous QHS  . insulin aspart  10 Units Subcutaneous TID WC  . insulin glargine  30 Units Subcutaneous QHS  . metoCLOPramide  5 mg Oral TID AC  . metoprolol tartrate  25 mg Oral BID  . multivitamin with minerals  1 tablet Oral Daily  . rOPINIRole  2 mg Oral BID  . simvastatin  10 mg Oral QHS  . sodium chloride  3 mL Intravenous  Q12H  . sotalol  80 mg Oral BID   Continuous Infusions: . heparin 1,900 Units/hr (09/12/14 0542)   PRN Meds:.HYDROmorphone, ipratropium-albuterol, sodium chloride, traMADol Assessment/Plan: Principal Problem:   Disseminated malignancy of unknown primary Active Problems:   IDDM (insulin dependent diabetes mellitus)   CAP (community acquired pneumonia)   HLD (hyperlipidemia)   BP (high blood pressure)   Gout of ankle   Atrial fibrillation   Pulmonary embolus  Patient is a 74 year old male with a history of type 2 diabetes, hypertension, congestive heart failure, hyperlipidemia, gout admitted for community-acquired pneumonia whose hospital course has been complicated by persistent hypoxia found to be secondary to pulmonary embolism in the setting of likely extensive metastatic disease.  Metastatic cancer of uncertain primary: Patient due for a liver biopsy today given that his INR is now below 1.5. Multiple small pulmonary nodules as well as multiple liver lesions are all concerning for metastatic cancer. Primary hepatocellular carcinoma, pancreatic cancer, or other GI malignancy are at the top of the differential. This malignancy has likely caused the patient to be in a hypercoagulable state with several complications as described  below. -Interventional radiology liver biopsy today. -Consider consult to oncology pending preliminary biopsy results -Unfractionated heparin drip. Consider transition to low molecular weight heparin upon discharge. -Potential discharge to skilled nursing facility today.  Lower extremity vascular disease: Patient with slightly worsened diffuse erythema, pain, and focal areas of focal necrosis in the left lower extremity. In the setting of hypercoagulability, this likely represents a diffuse superficial thrombophlebitis. There is no evidence of a complicated and suppurative thrombophlebitis. However, this patient's presentation does not seem to be classic given the lack of a palpable cord. Another possibility could be phlegmasia cerulea dolens. However, patient does not exhibit some of the more severe signs and symptoms including arterial compromise, cyanosis, or diffuse gangrene. However, treatment options for patient would be limited given suspected diffuse metastatic disease. -Continue unfractionated heparin -Lower extremity arterial duplex repeat study pending -Tramadol and Dilaudid for pain control -Elevate legs -TED hose if able to tolerate -Warm compresses  Bilateral pulmonary embolism: Patient with a persistent oxygen requirement of 4.5 L nasal cannula today. Patient's future course of recovery may be prolonged given extensive clot burden in addition to a general hypercoagulable state secondary to likely malignancy. Patient previously on Coumadin but transitioned to heparin due to possible malignancies. -Continue unfractionated heparin drip. Consider transition to low molecular weight heparin upon discharge. -Patient will need to be discharged with home oxygen.  Community-acquired pneumonia: Likely resolved. Patient has ready completed a seven-day course of antibiotics. Would question if initial presentation may have been consistent with a pulmonary infarction secondary to embolism that may  have mimicked signs and symptoms of pneumonia.  Paroxysmal atrial fibrillation: Patient has remained in atrial fibrillation throughout this admission. Patient has remained in the 80s to low 100s pulses. There is some concern in having patient can currently on metoprolol and sotalol. -Appreciate cardiology recommendations -Diltiazem 30 mg every 6 hours -Metoprolol has been titrated up to 25 mg twice a day -Continue home sotalol to help with sinus rhythm once reestablished.   Systolic congestive heart failure:  Patient has continued to diuresis with around -18 net negative over this hospitalization, with 2.1 L over the last 24 hours. Weight seems unchanged. However, patient still exhibiting signs of fluid overload clinically. Repeat echocardiogram showing mildly reduced systolic function of 06-26%. -Lasix 80 mg twice a day IV. -Appreciate cardiology recommendations.  Hypertension:  Patient has  been able to sustain satisfactory blood pressures. At home, patient is on diltiazem 120 mg daily, enalapril 20 mg daily, Lasix reportedly up to 200 mg daily, sotalol 80 mg twice a day.  -Continue to hold home enalapril given low normal blood pressures  -Blood pressure management as above with paroxysmal atrial fibrillation.  Type two diabetes:  Patient has had satisfactory blood glucose control.  -Decreased lantus to 30 units daily  -Continue NovoLog 10 3 times a day before meals  -Moderate sliding scale insulin with nighttime correction   Gout:  Patient currently on colchicine 0.6 mg 4 times a day. It is noted that there is a interaction with diltiazem. -Continue home colchicine at a lower dose of 0.6 mg daily.  Iron deficiency anemia: Patient is on 325 mg ferrous sulfate daily.  -Continue home ferrous sulfate.   Neuropathic pain: Patient is on gabapentin 600 mg twice a day.  -Continue home gabapentin   Diet: Carbohydrate modified  Prophylaxis: Patient currently on heparin and therapeutic Code  status: DNR/DNI, POA is son, Adryen Cookson cell [phone])  Dispo: Disposition is deferred at this time, awaiting improvement of current medical problems. Anticipated discharge in approximately 0-2 days). -pending liver biopsy   The patient does have a current PCP (Zorita Pang, MD) and does need an [facility] hospital follow-up appointment after discharge.  The patient does not have transportation limitations that hinder transportation to clinic appointments.    LOS: 9 days   Services Needed at time of discharge: Y = Yes, Blank = No PT:   OT:   RN:   Equipment:   Other:    Luan Moore, MD 09/12/2014, 9:58 AM

## 2014-09-12 NOTE — Procedures (Signed)
Technically successful US guided biopsy of infiltrative mass within the right lobe of the liver.  No immediate complications.

## 2014-09-13 DIAGNOSIS — D6859 Other primary thrombophilia: Secondary | ICD-10-CM

## 2014-09-13 LAB — GLUCOSE, CAPILLARY
GLUCOSE-CAPILLARY: 196 mg/dL — AB (ref 70–99)
Glucose-Capillary: 132 mg/dL — ABNORMAL HIGH (ref 70–99)
Glucose-Capillary: 182 mg/dL — ABNORMAL HIGH (ref 70–99)
Glucose-Capillary: 175 mg/dL — ABNORMAL HIGH (ref 70–99)

## 2014-09-13 LAB — CBC
HCT: 36 % — ABNORMAL LOW (ref 39.0–52.0)
Hemoglobin: 11.9 g/dL — ABNORMAL LOW (ref 13.0–17.0)
MCH: 31 pg (ref 26.0–34.0)
MCHC: 33.1 g/dL (ref 30.0–36.0)
MCV: 93.8 fL (ref 78.0–100.0)
PLATELETS: 231 10*3/uL (ref 150–400)
RBC: 3.84 MIL/uL — AB (ref 4.22–5.81)
RDW: 14.7 % (ref 11.5–15.5)
WBC: 15.7 10*3/uL — AB (ref 4.0–10.5)

## 2014-09-13 LAB — BODY FLUID CULTURE
CULTURE: NO GROWTH
Gram Stain: NONE SEEN

## 2014-09-13 LAB — BASIC METABOLIC PANEL
Anion gap: 11 (ref 5–15)
BUN: 18 mg/dL (ref 6–23)
CHLORIDE: 89 mmol/L — AB (ref 96–112)
CO2: 35 mmol/L — AB (ref 19–32)
CREATININE: 0.66 mg/dL (ref 0.50–1.35)
Calcium: 9 mg/dL (ref 8.4–10.5)
GFR calc Af Amer: >90 mL/min (ref >90)
GLUCOSE: 165 mg/dL — AB (ref 70–99)
Potassium: 5.4 mmol/L — ABNORMAL HIGH (ref 3.5–5.1)
Sodium: 135 mmol/L (ref 135–145)

## 2014-09-13 LAB — PROTIME-INR
INR: 1.3 (ref 0.00–1.49)
PROTHROMBIN TIME: 16.3 s — AB (ref 11.6–15.2)

## 2014-09-13 LAB — HEPARIN LEVEL (UNFRACTIONATED): HEPARIN UNFRACTIONATED: 0.35 [IU]/mL (ref 0.30–0.70)

## 2014-09-13 LAB — CEA: CEA: 269.6 ng/mL — ABNORMAL HIGH (ref 0.0–4.7)

## 2014-09-13 NOTE — Progress Notes (Signed)
Subjective: Son in room.  Patient reports left foot pain improved somewhat today however is still requing medication.  Reports he was able to move around room more today and sat up in chair for a few hours. Objective: Vital signs in last 24 hours: Filed Vitals:   09/12/14 2137 09/13/14 0141 09/13/14 0500 09/13/14 0510  BP: 122/53 97/62  116/61  Pulse: 94 92  74  Temp: 98.3 F (36.8 C) 98.6 F (37 C)  97.8 F (36.6 C)  TempSrc: Oral Oral  Oral  Resp: 16 17  16   Height:      Weight:   258 lb 4.8 oz (117.164 kg)   SpO2: 100% 97%  98%   Weight change:   Intake/Output Summary (Last 24 hours) at 09/13/14 0752 Last data filed at 09/13/14 0500  Gross per 24 hour  Intake 1397.98 ml  Output   2400 ml  Net -1002.02 ml   General: resting in bed, currently on 5L [place] with O2 sat of 97%, did desaturate some while speaking. HEENT: PERRL, EOMI, no scleral icterus Cardiac: irreg irreg, no rubs, murmurs or gallops Pulm: mild bibasilar rales, no wheezing Abd: soft, nontender, nondistended, BS present Ext: LLE warm, not tender to light touch, continues to have diffuse erythema and 2+ bilateral lower extremity edema.  Right leg circumference larger than left. Neuro: alert and oriented X3, cranial nerves II-XII grossly intact Psych: appropriate affect, appears in good mood.  Lab Results: Basic Metabolic Panel:  Recent Labs Lab 09/10/14 0524 09/11/14 0450  NA 133* 134*  K 4.9 4.1  CL 90* 90*  CO2 31 36*  GLUCOSE 119* 86  BUN 19 23  CREATININE 0.71 0.64  CALCIUM 8.6 8.3*   CBC:  Recent Labs Lab 09/10/14 0524 09/11/14 0450  WBC 17.9* 16.9*  HGB 13.2 12.2*  HCT 40.1 38.7*  MCV 93.9 95.8  PLT 261 302   CBG:  Recent Labs Lab 09/12/14 0732 09/12/14 1131 09/12/14 1716 09/12/14 1756 09/12/14 2112 09/13/14 0732  GLUCAP 91 87 61* 84 192* 132*  Coagulation:  Recent Labs Lab 09/09/14 0405 09/10/14 0524 09/11/14 0450 09/12/14 0445  LABPROT 31.1* 23.1* 19.7* 17.1*    INR 2.97* 2.03* 1.65* 1.38   Urinalysis:  Recent Labs Lab 09/09/14 1657  COLORURINE YELLOW  LABSPEC 1.018  PHURINE 5.5  GLUCOSEU NEGATIVE  HGBUR NEGATIVE  BILIRUBINUR NEGATIVE  KETONESUR NEGATIVE  PROTEINUR NEGATIVE  UROBILINOGEN 0.2  NITRITE NEGATIVE  LEUKOCYTESUR SMALL*   Misc. Labs: CEA > pending CA 19-9> pending  Micro Results: Recent Results (from the past 240 hour(s))  Culture, blood (routine x 2) Call MD if unable to obtain prior to antibiotics being given     Status: None   Collection Time: 09/15/2014  6:46 PM  Result Value Ref Range Status   Specimen Description BLOOD RIGHT ANTECUBITAL  Final   Special Requests BOTTLES DRAWN AEROBIC AND ANAEROBIC 5CC  Final   Culture   Final    NO GROWTH 5 DAYS Performed at [facility]    Report Status 09/10/2014 FINAL  Final  Culture, blood (routine x 2) Call MD if unable to obtain prior to antibiotics being given     Status: None   Collection Time: 09/01/2014  6:57 PM  Result Value Ref Range Status   Specimen Description BLOOD RIGHT HAND  Final   Special Requests BOTTLES DRAWN AEROBIC ONLY 3CC  Final   Culture   Final    NO GROWTH 5 DAYS Performed at [facility]    Report Status 09/10/2014 FINAL  Final  Body fluid culture     Status: None (Preliminary result)   Collection Time: 09/10/14 11:46 AM  Result Value Ref Range Status   Specimen Description FLUID PLEURAL LEFT  Final   Special Requests NONE  Final   Gram Stain   Final    NO WBC SEEN NO ORGANISMS SEEN Performed at [facility]    Culture   Final    NO GROWTH 2 DAYS Performed at [facility]    Report Status PENDING  Incomplete   Studies/Results: US Biopsy  09/12/2014   INDICATION: No known primary malignancy. Stigmata of cirrhosis on recently obtained CT scan.  CT scan demonstrates multiple ill-defined hypoattenuating hepatic lesions / masses worrisome for multifocal hepatocellular carcinoma. Ultrasound-guided lesion  biopsy has been requested for tissue diagnostic purposes.  EXAM: ULTRASOUND GUIDED LIVER LESION BIOPSY  COMPARISON:  CT abdomen pelvis - 09/09/2014  MEDICATIONS: Fentanyl 50 mcg IV; Versed 1 mg IV  ANESTHESIA/SEDATION: Total Moderate Sedation time  20 minutes  COMPLICATIONS: None immediate  PROCEDURE: Informed written consent was obtained from the patient after a discussion of the risks, benefits and alternatives to treatment. The patient understands and consents the procedure. A timeout was performed prior to the initiation of the procedure.  Ultrasound scanning was performed of the right upper abdominal quadrant demonstrates an ill-defined mixed echogenic mass nearly replacing the entirety of the caudal aspect of the right lobe of the liver compatible with the findings on preceding abdominal CT. The procedure was planned.  The right upper abdominal quadrant was prepped and draped in the usual sterile fashion. The overlying soft tissues were anesthetized with 1% lidocaine with epinephrine. A 17 gauge, 6.8 cm co-axial needle was advanced into a peripheral aspect of the lesion. This was followed by 4 core biopsies with an 18 gauge core device under direct ultrasound guidance.  The coaxial needle track was embolized with a small amount of Gel-Foam an removed. Superficial hemostasis was obtained with manual compression. Post procedural scanning was negative for definitive area of hemorrhage or additional complication. A dressing was placed. The patient tolerated the procedure well without immediate post procedural complication.  IMPRESSION: Technically successful ultrasound guided core needle biopsy of infiltrative mass within the right lobe of the liver.   Electronically Signed   By: Sandi Mariscal M.D.   On: 09/12/2014 16:50   Medications: I have reviewed the patient's current medications. Scheduled Meds: . antiseptic oral rinse  7 mL Mouth Rinse BID  . colchicine  0.6 mg Oral Daily  . dextromethorphan-guaiFENesin   1 tablet Oral BID  . diltiazem  30 mg Oral 4 times per day  . docusate sodium  200 mg Oral Daily  . furosemide  80 mg Intravenous BID  . gabapentin  600 mg Oral BID  . insulin aspart  0-15 Units Subcutaneous TID WC  . insulin aspart  0-5 Units Subcutaneous QHS  . insulin aspart  10 Units Subcutaneous TID WC  . insulin glargine  30 Units Subcutaneous QHS  . metoCLOPramide  5 mg Oral TID AC  . metoprolol tartrate  25 mg Oral BID  . multivitamin with minerals  1 tablet Oral Daily  . rOPINIRole  2 mg Oral BID  . simvastatin  10 mg Oral QHS  . sodium chloride  3 mL Intravenous Q12H  . sotalol  80 mg Oral BID   Continuous Infusions: . heparin 1,900 Units/hr (09/12/14 2100)  PRN Meds:.HYDROmorphone, ipratropium-albuterol, sodium chloride, traMADol Assessment/Plan:   Disseminated malignancy of unknown primary - Went for liver Bx yesterday, awaiting path report.  Oncology consulted yesterday, sent off CA 19-9 and CEA level.  Recommend Colonoscopy and Upper endoscopy if adenocarcinoma.  Recommend stay on Heparin for 4-5 days before transition to LMWH. - Hold off on discharge, need tissue Dx before he leaves - Can follow up with [facility]    IDDM (insulin dependent diabetes mellitus) - CBGs mostly well controlled, 132 this AM. -Lantus 30 u daily - Novolog 10u TIDAC - M-SSI    Postobstructive pneumonia - Completed extended course of Abx   Essential Hypertension - Currently 112/58, mildly hypotensive yesterday afternoon -Diltiazem 30mg  Q6 - Metoprolol 25mg  BID - Lasix 80mg  IV BID    Gout of ankle - Continue colchicine at 0.6mg  daily    Paroxysmal Atrial fibrillation - Diltiazem 30mg  Q6 - Metoprolol 25mg  BID - Sotalol -A/C with IV heparin    Pulmonary embolus due to hypercoagulable state - A/C with IV heparin, likely related to hypercoag state of malignancy, oncology recommend 4-5 days of heparin before transition to LMWH, also many need more procedures in  hospital.  Chronic systolic CHF: - Net neg 90W since admission.  Continues to have some peripheral edema which could be contributed by bilateral DVTs.   - Will continue Lasix 80mg  IV, and monitor for contraction alkalosis and physical exam. -Currently awaiting AM BMP, EPIC notes it is improcess since 7am, called lab, if they do not have sample they will need to recollect.  Dispo: Disposition is deferred at this time, awaiting tissue diagnosis and continue IV heparin as patient may require additional inpatient testing.  The patient does have a current PCP (Zorita Pang, MD) and does not need an [facility] hospital follow-up appointment after discharge.  The patient does not have transportation limitations that hinder transportation to clinic appointments.  .Services Needed at time of discharge: Y = Yes, Blank = No PT:   OT:   RN:   Equipment:   Other:     LOS: 10 days   Lucious Groves, DO 09/13/2014, 7:52 AM

## 2014-09-13 NOTE — Progress Notes (Signed)
ANTICOAGULATION CONSULT NOTE - Follow Up Consult  Pharmacy Consult:  Heparin Indication:  History of AFib and DVT + new PEs and DVTs  Allergies  Allergen Reactions  . Elavil [Amitriptyline] Other (See Comments)    Passes out    Patient Measurements: Height: 5\' 11"  (180.3 cm) Weight: 258 lb 4.8 oz (117.164 kg) (standing scale) IBW/kg (Calculated) : 75.3 Heparin Dosing Weight: 105 kg  Vital Signs: Temp: 97.8 F (36.6 C) (03/26 0510) Temp Source: Oral (03/26 0510) BP: 110/50 mmHg (03/26 0943) Pulse Rate: 96 (03/26 0943)  Labs:  Recent Labs  09/11/14 0450 09/12/14 0445 09/12/14 0500 09/13/14 0457 09/13/14 0500  HGB 12.2*  --   --  11.9*  --   HCT 38.7*  --   --  36.0*  --   PLT 302  --   --  231  --   LABPROT 19.7* 17.1*  --   --  16.3*  INR 1.65* 1.38  --   --  1.30  HEPARINUNFRC 0.54  --  0.68  --  0.35  CREATININE 0.64  --   --   --   --     Estimated Creatinine Clearance: 105.5 mL/min (by C-G formula based on Cr of 0.64).    Assessment: 57 YOM on Coumadin PTA for history of Afib and DVT in November of 2015.  Patient's INRs have been therapeutic or supra-therapeutic and found to have new PEs and DVTs that extended throughout the left leg.  Currently on IV heparin and heparin level is therapeutic   Goal of Therapy:  Heparin level 0.3-0.7 units/ml Monitor platelets by anticoagulation protocol: Yes     Plan:  - Continue heparin gtt at 1900 units/hr - Daily HL / CBC - F/U transitioning to Lovenox once surgery is no longer indicated   Rylinn Linzy D. Mina Marble, PharmD, BCPS Pager:  [phone] 09/13/2014, 11:07 AM

## 2014-09-13 NOTE — Progress Notes (Signed)
Internal Medicine Attending  Date: 09/13/2014  Patient name: Phillip Meyer Medical record number: [id number] Date of birth: 1940-08-27 Age: 74 y.o. Gender: male  I saw and evaluated the patient. I reviewed the resident's note by Dr. Heber Nuremberg and I agree with the resident's findings and plans as documented in his progress note.  Phillip Meyer left lower extremity pain is improved with the dilaudid. He was noted to be hypotensive with a systolic blood pressure of 88 when I was in the room, but he states he's not having any dizziness, chest pain, shortness of breath or other ill effects. He tolerated the biopsy well and there is no pain in this area. His hematocrit is relatively stable. He is aware that oncology would like him to stay until a tissue diagnosis has been confirmed to help plan for his care. He is in agreement for short-term skilled nursing facility placement upon discharge for further rehabilitation. We will continue to monitor his blood pressure and if it remains low will adjust his antihypertensive regimen and consider a possible intrahepatic bleed in the differential as well given the recent biopsy. At this point, given his clinical appearance and lack of symptoms we will monitor symptomatically and await the results of the biopsy.

## 2014-09-14 LAB — BASIC METABOLIC PANEL
ANION GAP: 8 (ref 5–15)
BUN: 16 mg/dL (ref 6–23)
CHLORIDE: 87 mmol/L — AB (ref 96–112)
CO2: 38 mmol/L — AB (ref 19–32)
Calcium: 8.7 mg/dL (ref 8.4–10.5)
Creatinine, Ser: 0.62 mg/dL (ref 0.50–1.35)
GFR calc Af Amer: >90 mL/min (ref >90)
GFR calc non Af Amer: >90 mL/min (ref >90)
GLUCOSE: 148 mg/dL — AB (ref 70–99)
POTASSIUM: 3.8 mmol/L (ref 3.5–5.1)
Sodium: 133 mmol/L — ABNORMAL LOW (ref 135–145)

## 2014-09-14 LAB — CBC
HCT: 33.8 % — ABNORMAL LOW (ref 39.0–52.0)
Hemoglobin: 10.8 g/dL — ABNORMAL LOW (ref 13.0–17.0)
MCH: 30.4 pg (ref 26.0–34.0)
MCHC: 32 g/dL (ref 30.0–36.0)
MCV: 95.2 fL (ref 78.0–100.0)
PLATELETS: 393 10*3/uL (ref 150–400)
RBC: 3.55 MIL/uL — ABNORMAL LOW (ref 4.22–5.81)
RDW: 14.5 % (ref 11.5–15.5)
WBC: 17.2 10*3/uL — ABNORMAL HIGH (ref 4.0–10.5)

## 2014-09-14 LAB — GLUCOSE, CAPILLARY
GLUCOSE-CAPILLARY: 117 mg/dL — AB (ref 70–99)
GLUCOSE-CAPILLARY: 177 mg/dL — AB (ref 70–99)
Glucose-Capillary: 196 mg/dL — ABNORMAL HIGH (ref 70–99)
Glucose-Capillary: 257 mg/dL — ABNORMAL HIGH (ref 70–99)

## 2014-09-14 LAB — HEPARIN LEVEL (UNFRACTIONATED): Heparin Unfractionated: 0.52 IU/mL (ref 0.30–0.70)

## 2014-09-14 MED ORDER — FUROSEMIDE 80 MG PO TABS
80.0000 mg | ORAL_TABLET | Freq: Two times a day (BID) | ORAL | Status: DC
  Administered 2014-09-14 – 2014-09-15 (×3): 80 mg via ORAL
  Filled 2014-09-14 (×3): qty 1

## 2014-09-14 MED ORDER — HEPARIN BOLUS VIA INFUSION
3000.0000 [IU] | Freq: Once | INTRAVENOUS | Status: AC
  Administered 2014-09-14: 3000 [IU] via INTRAVENOUS
  Filled 2014-09-14: qty 3000

## 2014-09-14 NOTE — Progress Notes (Signed)
ANTICOAGULATION CONSULT NOTE - Follow Up Consult  Pharmacy Consult for heparin Indication: chest pain/ACS, pulmonary embolus and DVT   Labs:  Recent Labs  09/12/14 0445 09/12/14 0500 09/13/14 0457 09/13/14 0500 09/13/14 1238 09/14/14 0355  HGB  --   --  11.9*  --   --  10.8*  HCT  --   --  36.0*  --   --  33.8*  PLT  --   --  231  --   --  393  LABPROT 17.1*  --   --  16.3*  --   --   INR 1.38  --   --  1.30  --   --   HEPARINUNFRC  --  0.68  --  0.35  --  <0.10*  CREATININE  --   --   --   --  0.66 0.62     Assessment: 74yo male undetectable on heparin after several levels at goal though had been trending down quickly; RN reports no gtt issues overnight.  Goal of Therapy:  Heparin level 0.3-0.7 units/ml   Plan:  Will rebolus with heparin 3000 units x1 and increase gtt by 4 units/kgABW/hr to 2300 units/hr and check level in 6hr.  Wynona Neat, PharmD, BCPS  09/14/2014,5:50 AM

## 2014-09-14 NOTE — Progress Notes (Signed)
ANTICOAGULATION CONSULT NOTE - Follow Up Consult  Pharmacy Consult:  Heparin Indication:  History of AFib and DVT + new PEs and DVTs  Allergies  Allergen Reactions  . Elavil [Amitriptyline] Other (See Comments)    Passes out    Patient Measurements: Height: 5\' 11"  (180.3 cm) Weight: 289 lb 11 oz (131.4 kg) IBW/kg (Calculated) : 75.3 Heparin Dosing Weight: 105 kg  Vital Signs: Temp: 98.1 F (36.7 C) (03/27 1238) Temp Source: Oral (03/27 1238) BP: 101/48 mmHg (03/27 1238) Pulse Rate: 98 (03/27 1238)  Labs:  Recent Labs  09/12/14 0445 09/12/14 0500 09/13/14 0457 09/13/14 0500 09/13/14 1238 09/14/14 0355  HGB  --   --  11.9*  --   --  10.8*  HCT  --   --  36.0*  --   --  33.8*  PLT  --   --  231  --   --  393  LABPROT 17.1*  --   --  16.3*  --   --   INR 1.38  --   --  1.30  --   --   HEPARINUNFRC  --  0.68  --  0.35  --  <0.10*  CREATININE  --   --   --   --  0.66 0.62    Estimated Creatinine Clearance: 111.9 mL/min (by C-G formula based on Cr of 0.62).    Assessment: 50 YOM on Coumadin PTA for history of Afib and DVT in November of 2015.  Patient's INRs have been therapeutic or supra-therapeutic and found to have new PEs and DVTs that extended throughout the left leg.  Currently on IV heparin and level is therapeutic.  Unsure why this morning's heparin level is undetectable since patient has been stable on 1900 units/hr.  No bleeding reported.   Goal of Therapy:  Heparin level 0.3-0.7 units/ml Monitor platelets by anticoagulation protocol: Yes     Plan:  - Continue heparin gtt at 2300 units/hr - Daily HL / CBC - F/U transitioning to Lovenox once surgery is no longer indicated    Leobardo Granlund D. Mina Marble, PharmD, BCPS Pager:  [phone] 09/14/2014, 2:12 PM

## 2014-09-14 NOTE — Progress Notes (Signed)
Subjective: Patient reports he is doing well, he has no complaints, does admit to pain in left foot but reports it is tolerable with pain medications.  Reports he is getting tired of diet. Objective: Vital signs in last 24 hours: Filed Vitals:   09/13/14 2115 09/14/14 0050 09/14/14 0513 09/14/14 0514  BP: 94/75 94/75 110/69 110/69  Pulse: 93   94  Temp: 98.1 F (36.7 C)   98.1 F (36.7 C)  TempSrc: Oral   Oral  Resp: 18   16  Height:      Weight:    289 lb 11 oz (131.4 kg)  SpO2: 98%   92%   Weight change: 31 lb 6.2 oz (14.236 kg)  Intake/Output Summary (Last 24 hours) at 09/14/14 1005 Last data filed at 09/14/14 0900  Gross per 24 hour  Intake 1218.78 ml  Output   3750 ml  Net -2531.22 ml   General: resting in bed, currently on 4L [place] with O2 sat of 97% HEENT:  no scleral icterus Cardiac: irreg irreg, no  murmurs Pulm: CTAB no wheezing Abd: soft, nontender, nondistended, BS present, IR biopsy site  At right flank withoutecchymosis or tenderness Ext: LLE warm, not tender to light touch, continues to have diffuse erythema and 2+ bilateral lower extremity edema bilaterally. . Neuro: alert and oriented  Psych: appropriate affect, good mood.  Lab Results: Basic Metabolic Panel:  Recent Labs Lab 09/13/14 1238 09/14/14 0355  NA 135 133*  K 5.4* 3.8  CL 89* 87*  CO2 35* 38*  GLUCOSE 165* 148*  BUN 18 16  CREATININE 0.66 0.62  CALCIUM 9.0 8.7   CBC:  Recent Labs Lab 09/13/14 0457 09/14/14 0355  WBC 15.7* 17.2*  HGB 11.9* 10.8*  HCT 36.0* 33.8*  MCV 93.8 95.2  PLT 231 393   CBG:  Recent Labs Lab 09/12/14 2112 09/13/14 0732 09/13/14 1205 09/13/14 1729 09/13/14 2108 09/14/14 0756  GLUCAP 192* 132* 182* 175* 196* 117*  Coagulation:  Recent Labs Lab 09/10/14 0524 09/11/14 0450 09/12/14 0445 09/13/14 0500  LABPROT 23.1* 19.7* 17.1* 16.3*  INR 2.03* 1.65* 1.38 1.30   Urinalysis:  Recent Labs Lab 09/09/14 1657  COLORURINE YELLOW  LABSPEC  1.018  PHURINE 5.5  GLUCOSEU NEGATIVE  HGBUR NEGATIVE  BILIRUBINUR NEGATIVE  KETONESUR NEGATIVE  PROTEINUR NEGATIVE  UROBILINOGEN 0.2  NITRITE NEGATIVE  LEUKOCYTESUR SMALL*   Misc. Labs: CEA > 269.6 CA 19-9> pending  Micro Results: Recent Results (from the past 240 hour(s))  Body fluid culture     Status: None   Collection Time: 09/10/14 11:46 AM  Result Value Ref Range Status   Specimen Description FLUID PLEURAL LEFT  Final   Special Requests NONE  Final   Gram Stain   Final    NO WBC SEEN NO ORGANISMS SEEN Performed at [facility]    Culture   Final    NO GROWTH 3 DAYS Performed at [facility]    Report Status 09/13/2014 FINAL  Final   Studies/Results: US Biopsy  09/12/2014   INDICATION: No known primary malignancy. Stigmata of cirrhosis on recently obtained CT scan.  CT scan demonstrates multiple ill-defined hypoattenuating hepatic lesions / masses worrisome for multifocal hepatocellular carcinoma. Ultrasound-guided lesion biopsy has been requested for tissue diagnostic purposes.  EXAM: ULTRASOUND GUIDED LIVER LESION BIOPSY  COMPARISON:  CT abdomen pelvis - 09/09/2014  MEDICATIONS: Fentanyl 50 mcg IV; Versed 1 mg IV  ANESTHESIA/SEDATION: Total Moderate Sedation time  20 minutes  COMPLICATIONS: None  immediate  PROCEDURE: Informed written consent was obtained from the patient after a discussion of the risks, benefits and alternatives to treatment. The patient understands and consents the procedure. A timeout was performed prior to the initiation of the procedure.  Ultrasound scanning was performed of the right upper abdominal quadrant demonstrates an ill-defined mixed echogenic mass nearly replacing the entirety of the caudal aspect of the right lobe of the liver compatible with the findings on preceding abdominal CT. The procedure was planned.  The right upper abdominal quadrant was prepped and draped in the usual sterile fashion. The overlying soft tissues were  anesthetized with 1% lidocaine with epinephrine. A 17 gauge, 6.8 cm co-axial needle was advanced into a peripheral aspect of the lesion. This was followed by 4 core biopsies with an 18 gauge core device under direct ultrasound guidance.  The coaxial needle track was embolized with a small amount of Gel-Foam an removed. Superficial hemostasis was obtained with manual compression. Post procedural scanning was negative for definitive area of hemorrhage or additional complication. A dressing was placed. The patient tolerated the procedure well without immediate post procedural complication.  IMPRESSION: Technically successful ultrasound guided core needle biopsy of infiltrative mass within the right lobe of the liver.   Electronically Signed   By: Sandi Mariscal M.D.   On: 09/12/2014 16:50   Medications: I have reviewed the patient's current medications. Scheduled Meds: . antiseptic oral rinse  7 mL Mouth Rinse BID  . colchicine  0.6 mg Oral Daily  . dextromethorphan-guaiFENesin  1 tablet Oral BID  . diltiazem  30 mg Oral 4 times per day  . docusate sodium  200 mg Oral Daily  . furosemide  80 mg Intravenous BID  . gabapentin  600 mg Oral BID  . insulin aspart  0-15 Units Subcutaneous TID WC  . insulin aspart  0-5 Units Subcutaneous QHS  . insulin aspart  10 Units Subcutaneous TID WC  . insulin glargine  30 Units Subcutaneous QHS  . metoCLOPramide  5 mg Oral TID AC  . metoprolol tartrate  25 mg Oral BID  . multivitamin with minerals  1 tablet Oral Daily  . rOPINIRole  2 mg Oral BID  . simvastatin  10 mg Oral QHS  . sodium chloride  3 mL Intravenous Q12H  . sotalol  80 mg Oral BID   Continuous Infusions: . heparin 2,300 Units/hr (09/14/14 0602)   PRN Meds:.HYDROmorphone, ipratropium-albuterol, sodium chloride, traMADol Assessment/Plan:   Disseminated malignancy of unknown primary - Went for liver Bx yesterday, awaiting path report.  Oncology consulted on friday, CEA is elevated CA 19-9 is  pending.  Recommend Colonoscopy and Upper endoscopy if adenocarcinoma.  Recommend stay on Heparin for 4-5 days before transition to LMWH. - need tissue Dx before discharge - Can follow up with [facility]    IDDM (insulin dependent diabetes mellitus) - CBGs mostly well controlled,  -Lantus 30 u daily - Novolog 10u TIDAC - M-SSI    Postobstructive pneumonia - Completed extended course of Abx   Essential Hypertension - Currently 110/69, mildly hypotensive again yesterday afternoon, no signs of bleeding from liver Bx site. -Diltiazem 30mg  Q6 - Metoprolol 25mg  BID - Lasix 80 BID    Gout of ankle - Continue colchicine at 0.6mg  daily    Paroxysmal Atrial fibrillation - Diltiazem 30mg  Q6 - Metoprolol 25mg  BID - Sotalol - A/C with IV heparin    Pulmonary embolus due to hypercoagulable state - A/C with IV heparin, likely related to  hypercoag state of malignancy, oncology recommend 4-5 days of heparin before transition to LMWH, also many need more procedures in hospital.  Chronic systolic CHF: - Net neg 53M since admission.  Continues to have some peripheral edema which could be contributed by bilateral DVTs.   -  BMP suggests worsening Metabolic alkalosis,  Will transition him back to oral Lasix 80mg  BID and monitor fluid status -Will relax diet to regular  Code Status: DNR Diet: Change to regular diet (patient preference) Dispo: Disposition is deferred at this time, awaiting tissue diagnosis and continue IV heparin as patient may require additional inpatient testing.  The patient does have a current PCP (Zorita Pang, MD) and does not need an [facility] hospital follow-up appointment after discharge.  The patient does not have transportation limitations that hinder transportation to clinic appointments.  .Services Needed at time of discharge: Y = Yes, Blank = No PT:   OT:   RN:   Equipment:   Other:     LOS: 11 days   Lucious Groves, DO 09/14/2014, 10:05 AM

## 2014-09-14 NOTE — Progress Notes (Signed)
Internal Medicine Attending  Date: 09/14/2014  Patient name: [name] Medical record number: [id number] Date of birth: 04-20-1941 Age: 74 y.o. Gender: male  I saw and evaluated the patient. I reviewed the resident's note by Dr. Heber Dotsero and I agree with the resident's findings and plans as documented in his progress note.  The left foot pain is unchanged but well controlled on the dilaudid. We discussed his diet with him today and said he could eat anything that he wanted and encouraged his family to bring in any of his favorite foods. His CEA was elevated and his biopsy results are pending. He also developed a metabolic alkalosis suggesting a contraction alkalosis and we will be converting his IV Lasix back to the oral doses he was on prior to coming to the hospital. Further evaluation, if necessary, and plan as pending the results of the biopsy. In the meantime, we will continue current supportive care.

## 2014-09-15 DIAGNOSIS — K769 Liver disease, unspecified: Secondary | ICD-10-CM

## 2014-09-15 DIAGNOSIS — I82409 Acute embolism and thrombosis of unspecified deep veins of unspecified lower extremity: Secondary | ICD-10-CM

## 2014-09-15 DIAGNOSIS — J188 Other pneumonia, unspecified organism: Secondary | ICD-10-CM

## 2014-09-15 DIAGNOSIS — R918 Other nonspecific abnormal finding of lung field: Secondary | ICD-10-CM

## 2014-09-15 LAB — BASIC METABOLIC PANEL
ANION GAP: 7 (ref 5–15)
BUN: 14 mg/dL (ref 6–23)
CHLORIDE: 88 mmol/L — AB (ref 96–112)
CO2: 38 mmol/L — ABNORMAL HIGH (ref 19–32)
Calcium: 8.3 mg/dL — ABNORMAL LOW (ref 8.4–10.5)
Creatinine, Ser: 0.66 mg/dL (ref 0.50–1.35)
Glucose, Bld: 211 mg/dL — ABNORMAL HIGH (ref 70–99)
POTASSIUM: 3.8 mmol/L (ref 3.5–5.1)
Sodium: 133 mmol/L — ABNORMAL LOW (ref 135–145)

## 2014-09-15 LAB — HEPARIN LEVEL (UNFRACTIONATED)
HEPARIN UNFRACTIONATED: 0.59 [IU]/mL (ref 0.30–0.70)
Heparin Unfractionated: 0.72 IU/mL — ABNORMAL HIGH (ref 0.30–0.70)

## 2014-09-15 LAB — CBC
HEMATOCRIT: 36.2 % — AB (ref 39.0–52.0)
Hemoglobin: 11.5 g/dL — ABNORMAL LOW (ref 13.0–17.0)
MCH: 30.6 pg (ref 26.0–34.0)
MCHC: 31.8 g/dL (ref 30.0–36.0)
MCV: 96.3 fL (ref 78.0–100.0)
PLATELETS: 364 10*3/uL (ref 150–400)
RBC: 3.76 MIL/uL — AB (ref 4.22–5.81)
RDW: 15 % (ref 11.5–15.5)
WBC: 18 10*3/uL — AB (ref 4.0–10.5)

## 2014-09-15 LAB — GLUCOSE, CAPILLARY
GLUCOSE-CAPILLARY: 185 mg/dL — AB (ref 70–99)
Glucose-Capillary: 208 mg/dL — ABNORMAL HIGH (ref 70–99)
Glucose-Capillary: 299 mg/dL — ABNORMAL HIGH (ref 70–99)
Glucose-Capillary: 212 mg/dL — ABNORMAL HIGH (ref 70–99)

## 2014-09-15 LAB — CEA: CEA: 258.2 ng/mL — ABNORMAL HIGH (ref 0.0–4.7)

## 2014-09-15 LAB — OTHER BODY FLUID CHEMISTRY

## 2014-09-15 LAB — CANCER ANTIGEN 19-9
CA 19-9: 236240 U/mL — ABNORMAL HIGH (ref 0–35)
CA 19-9: 173500 U/mL — ABNORMAL HIGH (ref 0–35)

## 2014-09-15 MED ORDER — ENOXAPARIN SODIUM 150 MG/ML ~~LOC~~ SOLN
130.0000 mg | Freq: Two times a day (BID) | SUBCUTANEOUS | Status: DC
  Administered 2014-09-15: 130 mg via SUBCUTANEOUS
  Filled 2014-09-15 (×3): qty 1

## 2014-09-15 NOTE — Progress Notes (Signed)
Phillip Meyer   DOB:14-Nov-1940   EP#:[id number]   YSA#:[id number]  Patient Care Team: Zorita Pang, MD as PCP - General (Unknown Physician Specialty)  Subjective: he is doing well. Denies fevers, chills, night sweats, vision changes, or mucositis. Denies any respiratory complaints. Denies any chest pain or palpitations. Denies lower extremity swelling. Denies nausea, heartburn or change in bowel habits.  Denies any dysuria. Denies abnormal skin rashes, or neuropathy. Denies any bleeding issues such as epistaxis, hematemesis, hematuria or hematochezia. He has not been mobilizing.   Scheduled Meds:  antiseptic oral rinse  7 mL Mouth Rinse BID   colchicine  0.6 mg Oral Daily   dextromethorphan-guaiFENesin  1 tablet Oral BID   diltiazem  30 mg Oral 4 times per day   docusate sodium  200 mg Oral Daily   furosemide  80 mg Oral BID   gabapentin  600 mg Oral BID   insulin aspart  0-15 Units Subcutaneous TID WC   insulin aspart  0-5 Units Subcutaneous QHS   insulin aspart  10 Units Subcutaneous TID WC   insulin glargine  30 Units Subcutaneous QHS   metoCLOPramide  5 mg Oral TID AC   metoprolol tartrate  25 mg Oral BID   multivitamin with minerals  1 tablet Oral Daily   rOPINIRole  2 mg Oral BID   simvastatin  10 mg Oral QHS   sodium chloride  3 mL Intravenous Q12H   sotalol  80 mg Oral BID   Continuous Infusions:  heparin 2,100 Units/hr (09/15/14 0614)   PRN Meds:HYDROmorphone, ipratropium-albuterol, sodium chloride, traMADol   Objective:  Filed Vitals:   09/15/14 1021  BP: 108/64  Pulse: 86  Temp:   Resp:       Intake/Output Summary (Last 24 hours) at 09/15/14 1101 Last data filed at 09/15/14 3235  Gross per 24 hour  Intake    240 ml  Output   2350 ml  Net  -2110 ml      GENERAL:alert, no distress and comfortable EYES: normal, conjunctiva are pink and non-injected, sclera clear OROPHARYNX:no exudate, no erythema and lips, buccal mucosa, and tongue  normal  NECK: supple, thyroid normal size, non-tender, without nodularity LYMPH:  no palpable lymphadenopathy in the cervical, axillary or inguinal LUNGS: clear to auscultation and percussion with normal breathing effort HEART: irregular rate & rhythm and no murmurs and 2-3+ lower extremity edema ABDOMEN: soft, non-tender and normal bowel sounds Musculoskeletal:no cyanosis of digits and no clubbing  PSYCH: alert & oriented x 3 with fluent speech NEURO: no focal motor/sensory deficits    CBG (last 3)   Recent Labs  09/14/14 1658 09/14/14 2201 09/15/14 0725  GLUCAP 177* 257* 185*     Labs:   Recent Labs Lab 09/10/14 0524 09/11/14 0450 09/13/14 0457 09/14/14 0355 09/15/14 0516  WBC 17.9* 16.9* 15.7* 17.2* 18.0*  HGB 13.2 12.2* 11.9* 10.8* 11.5*  HCT 40.1 38.7* 36.0* 33.8* 36.2*  PLT 261 302 231 393 364  MCV 93.9 95.8 93.8 95.2 96.3  MCH 30.9 30.2 31.0 30.4 30.6  MCHC 32.9 31.5 33.1 32.0 31.8  RDW 14.3 14.2 14.7 14.5 15.0     Chemistries:    Recent Labs Lab 09/10/14 0524 09/11/14 0450 09/13/14 1238 09/14/14 0355 09/15/14 0516  NA 133* 134* 135 133* 133*  K 4.9 4.1 5.4* 3.8 3.8  CL 90* 90* 89* 87* 88*  CO2 31 36* 35* 38* 38*  GLUCOSE 119* 86 165* 148* 211*  BUN 19 23 18  16  14  CREATININE 0.71 0.64 0.66 0.62 0.66  CALCIUM 8.6 8.3* 9.0 8.7 8.3*     Urine Studies     Component Value Date/Time   COLORURINE YELLOW 09/09/2014 1657   APPEARANCEUR CLOUDY* 09/09/2014 1657   LABSPEC 1.018 09/09/2014 1657   PHURINE 5.5 09/09/2014 1657   GLUCOSEU NEGATIVE 09/09/2014 1657   HGBUR NEGATIVE 09/09/2014 1657   BILIRUBINUR NEGATIVE 09/09/2014 1657   KETONESUR NEGATIVE 09/09/2014 1657   PROTEINUR NEGATIVE 09/09/2014 1657   UROBILINOGEN 0.2 09/09/2014 1657   NITRITE NEGATIVE 09/09/2014 1657   LEUKOCYTESUR SMALL* 09/09/2014 1657    Coagulation profile  Recent Labs Lab 09/09/14 0405 09/10/14 0524 09/11/14 0450 09/12/14 0445 09/13/14 0500  INR 2.97* 2.03*  1.65* 1.38 1.30    Cardiac Enzymes: No results for input(s): CKTOTAL, CKMB, CKMBINDEX, TROPONINI in the last 168 hours. BNP: Invalid input(s): POCBNP CBG:  Recent Labs Lab 09/14/14 0756 09/14/14 1218 09/14/14 1658 09/14/14 2201 09/15/14 0725  GLUCAP 117* 196* 177* 257* 185*   Microbiology Pleural fluid negative to date.  [name], Phillip Meyer Collected: 09/10/2014 Client: [place] Accession: [id number] Received: 09/10/2014 Phillip Dike, PA DOB: 07/30/1940 Age: 74 Gender: M Reported: 09/11/2014 [address] Patient Ph: [phone] MRN#: [id number] [place], [place] [address] Client Acc#: Chart: Phone:  Fax: LMP: Visit#: [id number] CC: CYTOPATHOLOGY REPORT Adequacy Reason Satisfactory For Evaluation. Diagnosis PLEURAL FLUID, LEFT (SPECIMEN 1 OF 1 COLLECTED 09-10-2014): NO MALIGNANT CELLS IDENTIFIED. ACUTE INFLAMMATION PRESENT. SEE COMMENT. Willeen Niece MD Pathologist, Electronic Signature   Imaging Studies:  No results found.  Assessment/Plan: 74 y.o.  Metastatic lesions of unknown primary. CT angiography of the chest on 09/08/2014 showed evidence of metastatic malignancy with innumerable bilateral small pulmonary nodules, as well as multiple liver lesions. CT of the abdomen and pelvis on 09/09/2014 confirmed the above findings, with the liver lesions suspicious for hepatocellular carcinoma. His liver biopsy results are still pending We will await for results, we will proceed with further recommendations. Patient would prefer to follow up as outpatient at the [facility] for geographic convenience.   Bilateral PE Current DVT In the setting of malignancy, recent surgery, and changes in his anticoagulants in the recent past. At some point, the patient was placed Eliquis after the recent surgery, which resulted in bleeding, for which he was replaced on Lovenox and then to Coumadin again. It is unclear if these gaps and  bridges played a role. The patient was on Coumadin prior to admission, and he was compliant with the medication. On admission, he was placed on IV heparin by pharmacy which eventually is to be transitioned to oral agent. Appreciate pharmacy involvement.  Postobstructive pneumonia The patient has been treated with antibiotics IV, nebulizers and oxygen. He is improving.  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  Deconditioning Patient is very weak. Recommend admitting team to consult OT /PT to help patient mobilize  DNR  Other medical issues Including atrial fibrillation, CHF, Diabetes, gout,Hypertension and hyperlipidemia as per admitting team.   **Disclaimer: This note was dictated with voice recognition software. Similar sounding words can inadvertently be transcribed and this note may contain transcription errors which may not have been corrected upon publication of note.** WERTMAN,SARA E, PA-C 09/15/2014  11:01 AM   ADDENDUM:  The liver biopsy is not yet out. However, his CA 19-9 is over 175,000. I really think that this is evidence of him having pancreatic cancer. There  is a ill-defined lesion in the pancreatic tail.  He still is on IV heparin.  There are plans to let him go to [facility] nursing home.  I talked to him about the probability that he has pancreatic cancer. I told him that on average, the survival is about 4 months.   He does not want any chemotherapy. He saw what his wife went through. He just wants some comfort. As such, I think that hospice would be appropriate for him. I probably would get palliative care while he is in the hospital.  I totally agree with his decision. I think that even with chemotherapy, his prognosis will be no more than 6 months. He does weigh quite a bit so that is in his favor.  I just do not think he is a candidate for the aggressive regimens that we  have now. I just want to make sure that his comfort is a primary consideration.  Again, he can follow up with the oncologist down at the [facility] in [place].   I think that he probably switched over to a subcutaneous anticoagulant. I think that subcutaneous Lovenox would be appropriate for him. I would not use Coumadin.  I saw that his CODE STATUS is now DO NOT RESUSCITATE. I totally agree with this.  Again, we will await the actual biopsy report but I have to believe that given the marked elevation of his CA 19-9, that we are dealing with metastatic pancreatic cancer.  Pete E.

## 2014-09-15 NOTE — Progress Notes (Signed)
  PROGRESS NOTE MEDICINE TEACHING ATTENDING   Day 40 of stay Patient name: Phillip Meyer   Medical record number: [id number] Date of birth: 1941/06/13   Met with [name]. He is comfortable on 2L of oxygen at this time.  Blood pressure 108/64, pulse 86, temperature 98.4 F (36.9 C), temperature source Oral,  Resp. rate 18, height $RemoveBe'5\' 11"'swcqiZNoI$  (1.803 m), weight 289 lb 11 oz (131.4 kg), SpO2 96 %. He is alert and oriented, comfortable, in no acute distress. PERRL, EOMI. Heart exhibits regular rate and rhythm, no murmurs. Lungs are clear to auscultation today - minor ocassional crackle/rhonchi. Abdomen is soft and non-tender. His left foot has become warm againe, however continues to have diffuse erythema, with a small 2cm draining superficial wound which is covered at present with a bandage. There are no gross focal neurological deficits apparent.   I have discussed the care of this patient with my IM team residents. Please see the resident note for details. Briefly, on the current top issue of metastatic malignancy of unknown primary site: Biopsy results awaited today. Both CEA and CA19-9 are elevated. AFP is not elevated. Oncology recommends doing further diagnostic tests in house (he is hypercoagulable, so the best and fastest way to do procedures might be by keeping him on heparin and I agree to that). The patient has never had a colonoscopy, however his colon did not appear affected on CT abdomen pelvis and he does not have any BM changes or anemia. There is a concerning lesion on pancreas as well. He has never smoked. He had a blood thoracentesis fluid drained, which was exudative.  In any case, once we diagnose the malignancy, the patient wants his care moved to [facility] where his wife was treated 3 years ago for cancer.  Rest per Dr Trudee Kuster note.  [place], [place] 09/15/2014, 11:25 AM.

## 2014-09-15 NOTE — Progress Notes (Signed)
ANTICOAGULATION CONSULT NOTE - Follow Up Consult  Pharmacy Consult:  Heparin -> Lovenox Indication:  History of AFib and DVT + new PEs and DVTs  Allergies  Allergen Reactions  . Elavil [Amitriptyline] Other (See Comments)    Passes out    Patient Measurements: Height: 5\' 11"  (180.3 cm) Weight: 289 lb 11 oz (131.4 kg) IBW/kg (Calculated) : 75.3 Heparin Dosing Weight: 105 kg  Vital Signs: Temp: 98.2 F (36.8 C) (03/28 1255) Temp Source: Oral (03/28 1255) BP: 112/52 mmHg (03/28 1255) Pulse Rate: 78 (03/28 1255)  Labs:  Recent Labs  09/13/14 0457  09/13/14 0500 09/13/14 1238 09/14/14 0355 09/14/14 1240 09/15/14 0516 09/15/14 1314  HGB 11.9*  --   --   --  10.8*  --  11.5*  --   HCT 36.0*  --   --   --  33.8*  --  36.2*  --   PLT 231  --   --   --  393  --  364  --   LABPROT  --   --  16.3*  --   --   --   --   --   INR  --   --  1.30  --   --   --   --   --   HEPARINUNFRC  --   < > 0.35  --  <0.10* 0.52 0.72* 0.59  CREATININE  --   --   --  0.66 0.62  --  0.66  --   < > = values in this interval not displayed.  Estimated Creatinine Clearance: 111.9 mL/min (by C-G formula based on Cr of 0.66).    Assessment: 18 YOM on Coumadin PTA for history of Afib and DVT in November of 2015.  Patient's INRs have been therapeutic or supra-therapeutic and found to have new PEs and DVTs that extended throughout the left leg.  Currently on IV heparin and now switching to Lovenox. CBC stable.   Goal of Therapy:  Anti-Xa level 0.6-1.0 units/ml (4 hours post dose) Monitor platelets by anticoagulation protocol: Yes     Plan:  D/c heparin Lovenox 130mg  SQ q12h. First dose one hour after heparin d/c  D/c heparin level Change CBC to q72h while on Lovenox  Sherlon Handing, PharmD, BCPS Clinical pharmacist, pager [phone] 09/15/2014 7:11 PM

## 2014-09-15 NOTE — Clinical Social Work Note (Signed)
CSW left message for [facility]  admissions person to call CSW back to confirm there is still a bed available for patient.  Awaiting call back from facility.  Jones Broom. [place], MSW, [place] 09/15/2014 4:59 PM

## 2014-09-15 NOTE — Progress Notes (Signed)
Physical Therapy Treatment Patient Details Name: Phillip Meyer MRN: [id number] DOB: May 14, 1941 Today's Date: 09/15/2014    History of Present Illness Adm with pna PMHx- DM, multiple back surgeries with Lt foot drop, CHF, gout, RLE DVT. Metastatic cancer of uncertain primary: Patient was found to have innumerable bilateral small pulmonary nodules as well as multiple liver lesions. Bilateral pulmonary embolism.     PT Comments    Worked on progressing mobility this session. Patient able to walk around room several times. O2 remained above 90% on RA throughout mobility. RN made aware. Spoke with patient regarding activity tolerance and walking with staff as able. Continue to recommend SNF for ongoing Physical Therapy.     Follow Up Recommendations  SNF     Equipment Recommendations  None recommended by PT    Recommendations for Other Services       Precautions / Restrictions Precautions Precautions: Fall;Other (comment) Required Braces or Orthoses: Other Brace/Splint Other Brace/Splint: L AFO    Mobility  Bed Mobility Overal bed mobility: Modified Independent                Transfers Overall transfer level: Needs assistance Equipment used: Rolling walker (2 wheeled)   Sit to Stand: Min guard            Ambulation/Gait Ambulation/Gait assistance: Min guard Ambulation Distance (Feet): 80 Feet Assistive device: Rolling walker (2 wheeled) Gait Pattern/deviations: Shuffle;Trunk flexed   Gait velocity interpretation: Below normal speed for age/gender General Gait Details: INcreased flexion with ambulation. Able to ambulate in room. O2 on RA remained above 90% with ambulation.    Stairs            Wheelchair Mobility    Modified Rankin (Stroke Patients Only)       Balance                                    Cognition Arousal/Alertness: Awake/alert Behavior During Therapy: WFL for tasks assessed/performed Overall Cognitive Status:  Within Functional Limits for tasks assessed                      Exercises      General Comments        Pertinent Vitals/Pain Pain Assessment: No/denies pain    Home Living                      Prior Function            PT Goals (current goals can now be found in the care plan section) Progress towards PT goals: Progressing toward goals    Frequency  Min 3X/week    PT Plan Current plan remains appropriate    Co-evaluation             End of Session   Activity Tolerance: Patient tolerated treatment well Patient left: in chair;with call bell/phone within reach;with family/visitor present     Time: 5176-1607 PT Time Calculation (min) (ACUTE ONLY): 25 min  Charges:  $Gait Training: 8-22 mins $Therapeutic Activity: 8-22 mins                    G Codes:      [name] 09/15/2014, 3:10 PM  09/15/2014 [name] PTA [phone] pager [phone] office

## 2014-09-15 NOTE — Progress Notes (Signed)
ANTICOAGULATION CONSULT NOTE - Follow Up Consult  Pharmacy Consult for heparin Indication: chest pain/ACS, pulmonary embolus and DVT   Labs:  Recent Labs  09/13/14 0457  09/13/14 0500 09/13/14 1238 09/14/14 0355 09/14/14 1240 09/15/14 0516  HGB 11.9*  --   --   --  10.8*  --   --   HCT 36.0*  --   --   --  33.8*  --   --   PLT 231  --   --   --  393  --   --   LABPROT  --   --  16.3*  --   --   --   --   INR  --   --  1.30  --   --   --   --   HEPARINUNFRC  --   < > 0.35  --  <0.10* 0.52 0.72*  CREATININE  --   --   --  0.66 0.62  --  0.66  < > = values in this interval not displayed.   Assessment: 74yo male now slightly supratherapeutic on heparin after one level at goal.  Goal of Therapy:  Heparin level 0.3-0.7 units/ml   Plan:  Will decrease heparin gtt slightly to 2100 units/hr and check level in 6hr.  Wynona Neat, PharmD, BCPS  09/15/2014,6:11 AM

## 2014-09-15 NOTE — Consult Note (Signed)
WOC wound consult note Reason for Consult: Evaluation of LLE wounds Wound type: Ruptured Blister Pressure Ulcer POA: N/A Measurement:1.3X1.8X0.2cm Wound bed: 100% red, moist wound bed Drainage (amount, consistency, odor): Scant serosanguinous drainage, no odor. Periwound: Erythematous, 2+edema, scattered intact blood filled blisters to LLE Dressing procedure/placement/frequency: Pt has LLE DVT, currently being treated with IV heparin.  Multiple scattered intact blood filled blisters to LLE noted, with a ruptured blister to anterior aspect of shin.  Recommend foam dressing to open area, change every 5 Days or prn for soilage.  If any new blisters rupture, foam dressing can be applied to those areas as well.  Pt and family educated on plan of care and verbalized understanding.    Orson Gear, BSN, RN-BC  Graduate Student  Please re-consult if further assistance is needed.  Thank-you,  Julien Girt MSN, [place], [place], [place], [place]

## 2014-09-15 NOTE — Progress Notes (Addendum)
Subjective:  Patient reports that he is doing well. He says that his breathing has improved. He is eagerly anticipating the results of his biopsy. He states that he was able to eat decent food from outside the hospital yesterday.  Objective: Vital signs in last 24 hours: Filed Vitals:   09/14/14 0514 09/14/14 1238 09/14/14 2202 09/15/14 0539  BP: 110/69 101/48 121/61 123/71  Pulse: 94 98 103 85  Temp: 98.1 F (36.7 C) 98.1 F (36.7 C) 97.9 F (36.6 C) 97.8 F (36.6 C)  TempSrc: Oral Oral Oral Oral  Resp: 16 17 18 16   Height:      Weight: 289 lb 11 oz (131.4 kg)     SpO2: 92% 93% 96% 92%   Weight change:   Intake/Output Summary (Last 24 hours) at 09/15/14 8916 Last data filed at 09/15/14 9450  Gross per 24 hour  Intake      0 ml  Output   2850 ml  Net  -2850 ml   General: resting comfortably in a chair, currently on 2 L nasal cannula with 99% O2 saturation HEENT:  no scleral icterus Cardiac: irreg irreg, no murmurs Pulm: CTAB, no rales or wheezing Abd: soft, nontender, nondistended, BS present, IR biopsy right without significant tenderness or ecchymosis Ext: LLE warm, not tender to light touch, continues to have diffuse erythema along with one prominent 3 cm bursted bulla draining serosanguineous fluid, 2+ bilateral lower extremity edema Neuro: alert and oriented  Psych: appropriate affect, good mood.  Lab Results: Basic Metabolic Panel:  Recent Labs Lab 09/14/14 0355 09/15/14 0516  NA 133* 133*  K 3.8 3.8  CL 87* 88*  CO2 38* 38*  GLUCOSE 148* 211*  BUN 16 14  CREATININE 0.62 0.66  CALCIUM 8.7 8.3*   CBC:  Recent Labs Lab 09/13/14 0457 09/14/14 0355  WBC 15.7* 17.2*  HGB 11.9* 10.8*  HCT 36.0* 33.8*  MCV 93.8 95.2  PLT 231 393   CBG:  Recent Labs Lab 09/13/14 1729 09/13/14 2108 09/14/14 0756 09/14/14 1218 09/14/14 1658 09/14/14 2201  GLUCAP 175* 196* 117* 196* 177* 257*  Coagulation:  Recent Labs Lab 09/10/14 0524 09/11/14 0450  09/12/14 0445 09/13/14 0500  LABPROT 23.1* 19.7* 17.1* 16.3*  INR 2.03* 1.65* 1.38 1.30   Urinalysis:  Recent Labs Lab 09/09/14 1657  COLORURINE YELLOW  LABSPEC 1.018  PHURINE 5.5  GLUCOSEU NEGATIVE  HGBUR NEGATIVE  BILIRUBINUR NEGATIVE  KETONESUR NEGATIVE  PROTEINUR NEGATIVE  UROBILINOGEN 0.2  NITRITE NEGATIVE  LEUKOCYTESUR SMALL*   Misc. Labs: CEA > 269.6 CA 19-9> pending  Micro Results: Recent Results (from the past 240 hour(s))  Body fluid culture     Status: None   Collection Time: 09/10/14 11:46 AM  Result Value Ref Range Status   Specimen Description FLUID PLEURAL LEFT  Final   Special Requests NONE  Final   Gram Stain   Final    NO WBC SEEN NO ORGANISMS SEEN Performed at [facility]    Culture   Final    NO GROWTH 3 DAYS Performed at [facility]    Report Status 09/13/2014 FINAL  Final   Studies/Results: No results found. Medications: I have reviewed the patient's current medications. Scheduled Meds: . antiseptic oral rinse  7 mL Mouth Rinse BID  . colchicine  0.6 mg Oral Daily  . dextromethorphan-guaiFENesin  1 tablet Oral BID  . diltiazem  30 mg Oral 4 times per day  . docusate sodium  200 mg Oral  Daily  . furosemide  80 mg Oral BID  . gabapentin  600 mg Oral BID  . insulin aspart  0-15 Units Subcutaneous TID WC  . insulin aspart  0-5 Units Subcutaneous QHS  . insulin aspart  10 Units Subcutaneous TID WC  . insulin glargine  30 Units Subcutaneous QHS  . metoCLOPramide  5 mg Oral TID AC  . metoprolol tartrate  25 mg Oral BID  . multivitamin with minerals  1 tablet Oral Daily  . rOPINIRole  2 mg Oral BID  . simvastatin  10 mg Oral QHS  . sodium chloride  3 mL Intravenous Q12H  . sotalol  80 mg Oral BID   Continuous Infusions: . heparin 2,100 Units/hr (09/15/14 2620)   PRN Meds:.HYDROmorphone, ipratropium-albuterol, sodium chloride, traMADol Assessment/Plan:    Disseminated malignancy of unknown primary - Liver biopsy  performed on 09/12/2014, awaiting pathology report. Both CEA and CA-19-9 are elevated. Will plan on colonoscopy and upper endoscopy in house if adenocarcinoma.  - Anticipated read either this afternoon or tomorrow morning per pathology.  -Appreciate oncology consultation -Will keep on heparin pending possible further procedures as an inpatient. -Will obtain tissue diagnosis before discharge.  -Patient to follow-up at [place] [facility] as an outpatient    IDDM (insulin dependent diabetes mellitus) - CBGs moderately well controlled - Lantus 30 units daily - Novolog 10u TIDAC - M-SSI    Postobstructive pneumonia - Completed extended course of Abx   Essential Hypertension - Currently patient has mostly remained normotensive over the last 24 hours. -Diltiazem 30mg  Q6 - Metoprolol 25mg  BID - Lasix 80 BID  Lower extremity DVT, thrombophlebitis:  -Continuing unfractionated heparin -Oral Dilaudid for pain control    Gout of ankle - Continue colchicine at 0.6mg  daily    Paroxysmal Atrial fibrillation - Diltiazem 30mg  Q6 - Metoprolol 25mg  BID - Sotalol - A/C with IV heparin    Pulmonary embolus due to hypercoagulable state - Continue unfractionated heparin  Chronic systolic CHF: - Net neg 35D since admission.  Continues to have some peripheral edema which could be contributed by bilateral DVTs.   - Patient with a persistent metabolic alkalosis. Will continue with oral Lasix 80 mg twice a day. -Will relax diet to regular  Code Status: DNR Diet: Change to regular diet (patient preference) Dispo: Disposition is deferred at this time, awaiting tissue diagnosis and continue IV heparin as patient may require additional inpatient testing.  The patient does have a current PCP (Zorita Pang, MD) and does not need an [facility] hospital follow-up appointment after discharge.  The patient does not have transportation limitations that hinder transportation to clinic appointments.  .Services  Needed at time of discharge: Y = Yes, Blank = No PT:   OT:   RN:   Equipment:   Other:     LOS: 12 days   Luan Moore, MD 09/15/2014, 7:04 AM

## 2014-09-15 NOTE — Progress Notes (Signed)
ANTICOAGULATION CONSULT NOTE - Follow Up Consult  Pharmacy Consult:  Heparin Indication:  History of AFib and DVT + new PEs and DVTs  Allergies  Allergen Reactions  . Elavil [Amitriptyline] Other (See Comments)    Passes out    Patient Measurements: Height: 5\' 11"  (180.3 cm) Weight: 289 lb 11 oz (131.4 kg) IBW/kg (Calculated) : 75.3 Heparin Dosing Weight: 105 kg  Vital Signs: Temp: 98.2 F (36.8 C) (03/28 1255) Temp Source: Oral (03/28 1255) BP: 112/52 mmHg (03/28 1255) Pulse Rate: 78 (03/28 1255)  Labs:  Recent Labs  09/13/14 0457  09/13/14 0500 09/13/14 1238 09/14/14 0355 09/14/14 1240 09/15/14 0516 09/15/14 1314  HGB 11.9*  --   --   --  10.8*  --  11.5*  --   HCT 36.0*  --   --   --  33.8*  --  36.2*  --   PLT 231  --   --   --  393  --  364  --   LABPROT  --   --  16.3*  --   --   --   --   --   INR  --   --  1.30  --   --   --   --   --   HEPARINUNFRC  --   < > 0.35  --  <0.10* 0.52 0.72* 0.59  CREATININE  --   --   --  0.66 0.62  --  0.66  --   < > = values in this interval not displayed.  Estimated Creatinine Clearance: 111.9 mL/min (by C-G formula based on Cr of 0.66).    Assessment: 74 YOM on Coumadin PTA for history of Afib and DVT in November of 2015.  Patient's INRs have been therapeutic or supra-therapeutic and found to have new PEs and DVTs that extended throughout the left leg.  Currently on IV heparin and level is therapeutic at 0.59 after rate decreased to 2100 units/hr this morning. No bleeding reported. CBC stable.   Goal of Therapy:  Heparin level 0.3-0.7 units/ml Monitor platelets by anticoagulation protocol: Yes     Plan:  - Continue heparin gtt at 2100 units/hr - Daily HL / CBC - F/U transitioning to Lovenox once surgery is no longer indicated Eudelia Bunch, Pharm.D. [phone] 09/15/2014 2:22 PM

## 2014-09-15 NOTE — Progress Notes (Signed)
Inpatient Diabetes Program Recommendations  AACE/ADA: New Consensus Statement on Inpatient Glycemic Control (2013)  Target Ranges:  Prepandial:   less than 140 mg/dL      Peak postprandial:   less than 180 mg/dL (1-2 hours)      Critically ill patients:  140 - 180 mg/dL     Results for LARS, JEZIORSKI (MRN [id number]) as of 09/15/2014 10:52  Ref. Range 09/14/2014 07:56 09/14/2014 12:18 09/14/2014 16:58 09/14/2014 22:01  Glucose-Capillary Latest Range: 70-99 mg/dL 117 (H) 196 (H) 177 (H) 257 (H)     Current Orders: Lantus 30 units QHS      Novolog 10 units tid with meals      Novolog Moderate SSI tid ac + HS    MD- Please consider increasing Novolog Meal Coverage slightly to Novolog 12 units tid with meals    Will follow Wyn Quaker RN, MSN, CDE Diabetes Coordinator Inpatient Diabetes Program Team Pager: [phone] (8a-5p)

## 2014-09-15 NOTE — Progress Notes (Signed)
Occupational Therapy Treatment Patient Details Name: Phillip Meyer MRN: [id number] DOB: Oct 27, 1940 Today's Date: 09/15/2014    History of present illness Adm with pna PMHx- DM, multiple back surgeries with Lt foot drop, CHF, gout, RLE DVT. Metastatic cancer of uncertain primary: Patient was found to have innumerable bilateral small pulmonary nodules as well as multiple liver lesions. Bilateral pulmonary embolism.    OT comments  Pt progressing. Continue to recommend SNF for rehab. Pt HR tachy in session.  Follow Up Recommendations  SNF;Supervision/Assistance - 24 hour    Equipment Recommendations  None recommended by OT    Recommendations for Other Services      Precautions / Restrictions Precautions Precautions: Fall;Other (comment) Precaution Comments: watch HR Required Braces or Orthoses: Other Brace/Splint Other Brace/Splint: L AFO Restrictions Weight Bearing Restrictions: No       Mobility Bed Mobility Overal bed mobility: Needs Assistance Bed Mobility: Sidelying to Sit   Sidelying to sit: Supervision       General bed mobility comments: supervision for IV  Transfers Overall transfer level: Needs assistance Equipment used: Rolling walker (2 wheeled) Transfers: Sit to/from Stand Sit to Stand: Min guard              Balance  Min guard for ambulation with RW.                                 ADL Overall ADL's : Needs assistance/impaired     Grooming: Supervision/safety;Sitting;Standing;Set up (shaved and applied aftershave to face)               Lower Body Dressing: Moderate assistance;Sitting/lateral leans (doffed sock and donned/doffed shoes) Lower Body Dressing Details (indicate cue type and reason): assist due to L AFO Toilet Transfer: Min guard;Ambulation;RW;BSC           Functional mobility during ADLs: Min guard;Rolling walker General ADL Comments: Educated on benefit of activity/therapy. Mentioned possibly using long  shoehorn to help with left shoe/AFO but unsure this would help-OT assisted pt.      Vision                     Perception     Praxis      Cognition  Awake/Alert Behavior During Therapy: WFL for tasks assessed/performed Overall Cognitive Status: Within Functional Limits for tasks assessed                       Extremity/Trunk Assessment               Exercises     Shoulder Instructions       General Comments      Pertinent Vitals/ Pain       Pain Assessment: No/denies pain; HR tachy in session; Mobilized pt on RA. O2 in 80's-90's at end of session. In 90's when OT left. Pt left on 2L of O2.  Home Living                                          Prior Functioning/Environment              Frequency Min 2X/week     Progress Toward Goals  OT Goals(current goals can now be found in the care plan section)  Progress towards OT goals: Progressing toward goals  Acute Rehab OT Goals Patient Stated Goal: not stated OT Goal Formulation: With patient (son) Time For Goal Achievement: 09/19/14 Potential to Achieve Goals: Good ADL Goals Pt Will Perform Grooming: with modified independence;standing Pt Will Perform Lower Body Bathing: with modified independence;sit to/from stand Pt Will Perform Lower Body Dressing: with modified independence;sit to/from stand Pt Will Transfer to Toilet: with modified independence;ambulating Pt Will Perform Toileting - Clothing Manipulation and hygiene: with modified independence;sit to/from stand Pt Will Perform Tub/Shower Transfer: with modified independence;ambulating;Shower transfer;shower seat  Plan Discharge plan remains appropriate;Frequency needs to be updated    Co-evaluation                 End of Session Equipment Utilized During Treatment: Gait belt;Rolling walker;Other (comment) (O2 placed back on at end of session)   Activity Tolerance Patient tolerated treatment well    Patient Left in bed;with call bell/phone within reach;with bed alarm set;with nursing/sitter in room   Nurse Communication Other (comment) (HR tachy)        Time: 2947-6546 OT Time Calculation (min): 20 min  Charges: OT General Charges $OT Visit: 1 Procedure OT Treatments $Self Care/Home Management : 8-22 mins  Benito Mccreedy OTR/L [phone] 09/15/2014, 5:44 PM

## 2014-09-16 DIAGNOSIS — I213 ST elevation (STEMI) myocardial infarction of unspecified site: Secondary | ICD-10-CM | POA: Diagnosis not present

## 2014-09-16 DIAGNOSIS — I2119 ST elevation (STEMI) myocardial infarction involving other coronary artery of inferior wall: Secondary | ICD-10-CM

## 2014-09-16 LAB — GLUCOSE, CAPILLARY: Glucose-Capillary: 237 mg/dL — ABNORMAL HIGH (ref 70–99)

## 2014-09-16 LAB — TROPONIN I: Troponin I: 0.46 ng/mL — ABNORMAL HIGH (ref <0.031)

## 2014-09-16 MED ORDER — PROMETHAZINE HCL 25 MG/ML IJ SOLN
12.5000 mg | Freq: Four times a day (QID) | INTRAMUSCULAR | Status: DC | PRN
  Administered 2014-09-16: 12.5 mg via INTRAVENOUS
  Filled 2014-09-16 (×2): qty 1

## 2014-09-16 MED ORDER — METOCLOPRAMIDE HCL 5 MG/ML IJ SOLN
5.0000 mg | Freq: Three times a day (TID) | INTRAMUSCULAR | Status: DC | PRN
  Filled 2014-09-16: qty 2

## 2014-09-16 MED ORDER — HYDROMORPHONE HCL 1 MG/ML IJ SOLN
1.0000 mg | INTRAMUSCULAR | Status: DC | PRN
  Administered 2014-09-16: 1 mg via INTRAVENOUS
  Filled 2014-09-16 (×2): qty 1

## 2014-09-16 MED ORDER — SODIUM CHLORIDE 0.9 % IV BOLUS (SEPSIS)
500.0000 mL | Freq: Once | INTRAVENOUS | Status: AC
Start: 2014-09-16 — End: 2014-09-16
  Administered 2014-09-16: 500 mL via INTRAVENOUS

## 2014-09-16 MED ORDER — HYDROMORPHONE HCL 2 MG PO TABS: 2.0000 mg | ORAL_TABLET | ORAL | Status: DC | PRN

## 2014-09-16 MED ORDER — MORPHINE SULFATE 2 MG/ML IJ SOLN
1.0000 mg | Freq: Once | INTRAMUSCULAR | Status: AC
  Administered 2014-09-16: 1 mg via INTRAVENOUS
  Filled 2014-09-16: qty 1

## 2014-09-19 NOTE — Progress Notes (Addendum)
SUBJECTIVE Paged by RN that he was having bilateral chest pain. Patient reports he was lying in bed when pain happened. Associated with feeling short of breath. He received Dilaudid and Tramadol prior to the onset of this chest pain. Last meal around midnight though denies it is gastric pain.   OBJECTIVE Filed Vitals:   10-10-14 0546  BP: 93/57  Pulse: 71  Temp: 97.8 F (36.6 C)  Resp: 18   General: resting in bed, mild distress Cardiac: irregular rate, no rubs, murmurs or gallops Neuro: alert and oriented X3, moving extremities freely  ASSESSMENT EKG findings with ST elevations in Leads 2, 3, aVF, T-wave inversions in V1, developing ST elevation in V2, V3  PLAN -Spoke with Dr. Burt Knack [Cardiology] who will be up to see him shortly though notes he is a poor candidate for the cath lab given his multiple co-morbidities -Continue narcotic pain regimen -Check stat troponin -Give morphine 1 mg IV -Give Phenergan 12.5 mg IV q6h prn for nausea -Holding nitroglycerin in the setting of hypotension -Give 500cc NS bolus

## 2014-09-19 NOTE — Significant Event (Signed)
Rapid Response Event Note  Overview: Time Called: 0822 Arrival Time: 0826    Initial Focused Assessment: called by RN to assist patient who is unresponsive and having a STEMI.  Upon my arrival to patients room, RN and family at bedside.  Patient sitting in chair with NRB mask on.  AS per RN sats and HR dropped, HR dropped to 40's, sats 70's, placed on NRB.  Patient not responsive with agonal respirations. Patient dusky, and diaphoretic.     Interventions: Pulse weak and thready, unable to obtain BP.  Patient experiencing about 1-2 minutes of apnea.  Lifted patient out of chair to bed.  Pupils 1 and non reactive.  Patient actively dying.  Chaplain called and at bedside.  Support given to family.  MD paged and at bedside.   Event Summary:  RN to call if assistance needed   at      at          [facility], [place] Rain

## 2014-09-19 NOTE — Discharge Summary (Signed)
Name: Phillip Meyer MRN: [id number] DOB: 1940/08/04 74 y.o. PCP: Zorita Pang, MD   Cause of death: STEMI in the setting of disseminated malignancy precluding further interventions Time of death: 10-07-14 on 1028/09/05 AM  Date of Admission: 09/01/2014  9:21 AM Date of Discharge: October 07, 2014  Attending Physician: Madilyn Fireman, MD  Discharge Diagnosis: Principal Problem:   Disseminated malignancy of unknown primary Active Problems:   IDDM (insulin dependent diabetes mellitus)   CAP (community acquired pneumonia)   HLD (hyperlipidemia)   BP (high blood pressure)   Gout of ankle   Atrial fibrillation   Pulmonary embolus  Consultations: Treatment Team:  Rounding Lbcardiology, MD  Procedures Performed:  Dg Chest 1 View  09/10/2014   CLINICAL DATA:  Post left thoracentesis.  EXAM: CHEST  1 VIEW  COMPARISON:  09/07/2014  FINDINGS: There are low lung volumes with bibasilar opacities, likely atelectasis. Decreasing left effusion post thoracentesis. No pneumothorax. Cardiomegaly with mild vascular congestion.  IMPRESSION: Decreasing left effusion following thoracentesis.  No pneumothorax.  Low lung volumes with bibasilar atelectasis.   Electronically Signed   By: Rolm Baptise M.D.   On: 09/10/2014 12:54   Dg Chest 2 View  09/07/2014   CLINICAL DATA:  Dyspnea.  Recent diagnosis of pneumonia.  EXAM: CHEST  2 VIEW  COMPARISON:  09/17/2014.  FINDINGS: There is persistent opacity in the lung bases, greater on the left, now mostly obscuring the left heart border and left hemidiaphragm on the frontal view. On the lateral view, left lung base opacity projects in the left upper lobe lingula and left lower lobe. Mild streaky opacity in the right lung base is stable.  No other areas of lung consolidation. No pulmonary edema. There is no convincing pleural effusion. No pneumothorax.  Heart is mildly enlarged.  IMPRESSION: 1. Slight changes in the appearance of the lung base opacity on the left is likely  due to larger lung volumes on the current exam and differences in radiographic technique and positioning. Overall, there has been no convincing change from the previous study with persistent left upper lobe lingular and left lower lobe consolidation and milder opacity at the right lung base. Right basilar opacity may all be atelectasis or additional infiltrate or a combination.   Electronically Signed   By: Lajean Manes M.D.   On: 09/07/2014 09:33   Dg Chest 2 View  09/08/2014   CLINICAL DATA:  Bilateral chest pain.  Shortness of breath.  EXAM: CHEST - 2 VIEW  COMPARISON:  Two-view chest x-ray 10/11/2013.  FINDINGS: The heart is enlarged. Lung volumes are low. Bilateral middle lobe and lingular airspace disease is present. No definite effusions are present. There is likely some disease in the left lower lobe as well.  IMPRESSION: 1. New bilateral airspace disease, predominately within the lingula and right middle lobe. This is most concerning for pneumonia. 2. Mild pulmonary vascular congestion is likely reactive. 3. Mild lower lobe airspace disease is present as well.   Electronically Signed   By: San Morelle M.D.   On: 09/17/2014 11:05   Ct Angio Chest Pe W/cm &/or Wo Cm  09/08/2014   CLINICAL DATA:  Right lower extremity DVT, shortness of breath and hypoxia.  EXAM: CT ANGIOGRAPHY CHEST WITH CONTRAST  TECHNIQUE: Multidetector CT imaging of the chest was performed using the standard protocol during bolus administration of intravenous contrast. Multiplanar CT image reconstructions and MIPs were obtained to evaluate the vascular anatomy.  CONTRAST:  39m OMNIPAQUE IOHEXOL 350 MG/ML  SOLN  COMPARISON:  Chest x-ray on 09/07/2014  FINDINGS: There is evidence of bilateral pulmonary embolism. Nearly occlusive thrombus is identified in the left lower lobe pulmonary artery. Nonocclusive thrombus is identified in the right lower lobe pulmonary artery with some occlusive component extending into a lateral  basilar subsegmental arterial branch. There is some small nonocclusive thrombus in the left upper lobe and right upper lobe. There is associated moderate left pleural effusion. There is airspace disease in the left lower lobe. Component of pneumonia cannot be excluded. Peripheral opacity in the lateral basilar right lower lobe measures roughly 2.0 x 2.7 cm this is in the distribution of occlusive pulmonary embolism and may represent a developing pulmonary infarct.  Innumerable small pulmonary nodules are seen throughout both lungs. The largest discrete nodule in the right lung is a 9 mm right middle lobe nodule. The largest nodule in the left lung is an 8 mm left upper lobe nodule. Masses could be obscured in the left lower lobe due to airspace disease and compressive atelectasis from the pleural effusion. No grossly enlarged lymph nodes are identified in the chest. Interstitial and pulmonary vascular prominence may be consistent with interstitial edema.  No pericardial fluid is seen. The heart size is normal. Extensive coronary arterial calcifications are seen in a 3 vessel distribution. No evidence of significant right heart strain related to pulmonary embolism with RV to LV diameter ratio of 0.86.  The visualized upper abdomen shows multiple lesions in both lobes of the liver. A large geographic lesion in the superior right lobe measures nearly 11 cm in estimated diameter. Separate 2.7 cm and 5.5 cm left lobe lesions are also visualized. The very last image also shows probable periportal/peripancreatic lymphadenopathy. CT imaging of the abdomen and pelvis is recommended for further evaluation. No focal bony lesions are identified. There is spondylosis in the lower thoracic spine  Review of the MIP images confirms the above findings.  IMPRESSION: 1. Bilateral pulmonary embolism with nearly occlusive thrombus in the left lower lobe pulmonary artery and occlusive component of thrombus in the lateral basilar  segmental branch of the right lower lobe. Nonocclusive thrombus is present in both upper lobes. No evidence of right heart strain by CTA. 2. There may be a component of interstitial edema. There also may be additional airspace disease in the left lower lung and there is a moderate left pleural effusion. 3. Evidence of metastatic malignancy with innumerable bilateral small pulmonary nodules identified as well as multiple liver lesions in the visualized liver. This includes a nearly 11 cm mass in the right lobe of the liver. Etiology of malignancy is not clear and further evaluation of the abdomen and pelvis is recommended by CT with contrast.  Results were called by telephone at the time of interpretation on 09/08/2014 at 1:51 pm to Dr. Luan Moore , who verbally acknowledged these results.   Electronically Signed   By: Aletta Edouard M.D.   On: 09/08/2014 13:54   Ct Abdomen Pelvis W Contrast  09/10/2014   ADDENDUM REPORT: 09/10/2014 10:22  ADDENDUM: Low-density expansion of the pancreatic tail which could reflect a pancreas adenocarcinoma. Planned liver biopsy will likely clarify this finding.   Electronically Signed   By: Monte Fantasia M.D.   On: 09/10/2014 10:22   09/10/2014   CLINICAL DATA:  Question cancer.  Multiple myeloma.  EXAM: CT ABDOMEN AND PELVIS WITH CONTRAST  TECHNIQUE: Multidetector CT imaging of the abdomen and pelvis was performed using the standard protocol following bolus  administration of intravenous contrast.  CONTRAST:  111m OMNIPAQUE IOHEXOL 300 MG/ML  SOLN  COMPARISON:  02/18/2014  FINDINGS: BODY WALL: Medication injection site in the left lower quadrant wall.  There is expansion of the subcutaneous fat and the left thigh muscles related to DVT. Bilateral DVT is visible, up to the common femoral veins.  LOWER CHEST: There is a small left pleural effusion which is stable from previous. No appreciable change in bilateral lower lobe pulmonary embolism and lung opacity.  There is a up to  28 mm lobulated mass along the right atrial wall, presumably a metastasis.  ABDOMEN/PELVIS:  Liver: There are numerous small hypo enhancing liver masses throughout the liver, with a confluent ill-defined mass in the right lobe measuring 10 cm. The other largest mass is in the segment 2 at 5 cm.  Liver morphology suggests cirrhosis, likely present before the metastases given fissure enlargement.  Biliary: Cholelithiasis.  No evidence of obstruction.  Pancreas: Unremarkable.  Spleen: Unremarkable.  Adrenals: Unremarkable.  Kidneys and ureters: No hydronephrosis or stone.  Bladder: Unremarkable.  Reproductive: No pathologic findings.  Bowel: No obstruction. No gross mass lesion.  Retroperitoneum: Moderate enlargement of lymph nodes in the deep liver drainage, with the portacaval node measuring 14 x 28 mm.  Peritoneum: No ascites or pneumoperitoneum.  Vascular: No acute abnormality.  OSSEOUS: No metastasis identified. There is advanced and diffuse degenerative disc and facet disease status post extensive posterior spinal fusion from T12-L5. Prior sacral fusion has been removed. There is been multilevel discectomy, including at L5-S1 where there is pseudoarthrosis.  IMPRESSION: 1. Hepatic metastatic disease and hepatic drainage adenopathy. A primary malignancy is not definitively identified, but given cirrhosis and a dominant right liver mass, hepatocellular carcinoma is a strong consideration. 2. Right atrial filling defect, appearance and clinical circumstances favors metastasis over thrombus. Cardiac MRI could differentiate if clinically needed. 3. Known bilateral pulmonary embolism. There is DVT with clot into the bilateral common femoral veins. Venous obstructive changes noted in the left thigh.  Electronically Signed: By: JMonte FantasiaM.D. On: 09/09/2014 19:12   UKoreaThoracentesis Asp Pleural Space W/img Guide  09/10/2014   CLINICAL DATA:  Pulmonary embolus. Left pleural effusion. Request for diagnostic and  therapeutic thoracentesis.  EXAM: ULTRASOUND GUIDED LEFT THORACENTESIS  COMPARISON:  None.  PROCEDURE: An ultrasound guided thoracentesis was thoroughly discussed with the patient and questions answered. The benefits, risks, alternatives and complications were also discussed. The patient understands and wishes to proceed with the procedure. Written consent was obtained.  Ultrasound was performed to localize and mark an adequate pocket of fluid in the left chest. The area was then prepped and draped in the normal sterile fashion. 1% Lidocaine was used for local anesthesia. Under ultrasound guidance a 19 gauge Yueh catheter was introduced. Thoracentesis was performed. The catheter was removed and a dressing applied.  COMPLICATIONS: None immediate  FINDINGS: A total of approximately 430 mL of bloody pleural fluid was removed. A fluid sample wassent for laboratory analysis.  IMPRESSION: Successful ultrasound guided left thoracentesis yielding 430 mL of pleural fluid.  Read by: KAscencion DikePA-C   Electronically Signed   By: AMarkus DaftM.D.   On: 09/10/2014 12:00    2D Echo:   Study Conclusions  - Left ventricle: The cavity size was normal. Wall thickness was increased in a pattern of mild LVH. Systolic function was mildly reduced. The estimated ejection fraction was in the range of 45% to 50%. Regional wall motion abnormalities cannot be  excluded. - Left atrium: The atrium was mildly dilated.   Cardiac Cath: none   Admission HPI:   Patient is a 74 year old male with a history of type 2 diabetes, hypertension, congestive heart failure, hyperlipidemia, gout who presents to the hospital for chest and abdominal pain made worse with breathing.  Patient reports chest and abdominal pain with deep breathing that started around 2 to 3 days ago. He reports having a cough that has improved over interval. He states that his cough had been productive of yellow phlegm. He does report associated dypsnea as  well as some nausea with no vomiting. He also reports associated rhinorrhea and loss of appetite. He reports runny stools though he takes a stool softener at home. He denies any fevers, chills, sick contacts.   Overall, patient has not felt right in the last two weeks. His blood sugars have been labile during this period. Yesterday, he had an episode of hypoglycemia down to the 67s yesterday and his sister stated that there was a period when patient lost consciousness. EMS was able to reportedly administer glucagon in the field. She reports that the blood glucose was in the 20s at that time. Patient refused transportation to the hospital at that time.   Hospital Course by problem list: Principal Problem:   Disseminated malignancy of unknown primary Active Problems:   IDDM (insulin dependent diabetes mellitus)   CAP (community acquired pneumonia)   HLD (hyperlipidemia)   BP (high blood pressure)   Gout of ankle   Atrial fibrillation   Pulmonary embolus    Metastatic cancer of unknown primary: In the setting of poor response to antibiotics for community-acquired pneumonia, a chest CTA was obtained to evaluate patient for pulmonary embolism. It was thought that the likelihood of pulmonary embolism was lower given that patient had remained therapeutic with his INR on Coumadin. However, CTA revealed innumerable bilateral small pulmonary nodules as well as multiple liver lesions largest of which measuring 11 cm in diameter in the right lobe. A moderate left pleural effusion was also found and a thoracentesis was performed on 09/10/2014 yielding an exudative effusion with blood though negative for empyema. Further cytology on this fluid pending. In the setting of persistently elevated INR, a liver biopsy was performed on 09/12/2014 most likely consistent with pancreatic cancer. Results pending. Otherwise, AFP wnl. Hepatitis panel wnl. Patient to be discharged on low molecular weight heparin in the setting  of malignancy.  Bilateral pulmonary embolism: Patient found to have extensive bilateral pulmonary emboli including nearly occlusive thrombus in the left lower lobe pulmonary artery on CTA. Patient remained therapeutic on Coumadin with INR between 2 and 3 throughout his hospitalization. It is thought that this may be secondary to underlying malignancy. Coumadin was discontinued and unfractionated heparin was started in the setting of malignancy as well as multiple invasive procedures including liver biopsy and thoracentesis.   Lower extremity vascular disease: Patient was found to have a subacute DVT in the right lower extremity on 09/06/2014. His right leg was noted to have a much larger circumference compared to his left leg. Additionally, patient did develop increased edema, pain, and erythema around the left ankle that was concerning for occlusive disease. Vascular surgery was consulted and patient was thought to be a poor surgical candidate given hypercoagulability and malignancy. ABIs did not show any significant occlusive disease. Patient's pain was managed on tramadol and morphine. Unfractionated heparin was started after discontinuation of Coumadin. Patient was transitioned to low molecular weight heparin.  Community Acquired Pneumonia: Patient initially presenting with a 2-3 day history of productive cough accompanied by pleuritic chest pain and abdominal pain. Patient had a leukocytosis of 19 and was afebrile. Chest x-ray showing bilateral airspace disease predominantly within the lingula and right middle lobe, concerning for pneumonia. Patient was started on azithromycin and ceftriaxone on the day of admission. Patient continued to have a persistent oxygen requirement of 3-5 L nasal cannula throughout his admission. Patient's productive cough resolved after several days of admission. Blood cultures collected on 08/29/2014 were negative. Sputum cultures were not collected. Patient was continued on  home tramadol for chest and abdominal discomfort. It is possible that patient may have had a community-acquired pneumonia, although pulmonary infarction in the setting of pulmonary embolism is also a possibility that may have manifested clinically in a similar way.  Systolic congestive heart failure: There was some initial question as to whether patient's initial presentation was exacerbated by a concurrent exacerbation of systolic congestive heart failure with pulmonary edema. Patient's initial exam remarkable for diffuse rhonchi and rales. Echocardiogram from 02/21/2014 from care everywhere showing an ejection fraction of 40-45% with mild global hypokinesis more pronounced in the distal inferior wall and apical septum. Patient's home Lasix dose of 200 mg daily by mouth was initially held in the setting of hypotension in the 90s over 50s. Patient's Lasix was resumed upon recovery of his blood pressures to systolics of low 413K to 440N. Patient was able to diurese with Lasix 80 mg IV twice a day with a net negative of 23 L over this hospitalization.  Paroxysmal atrial fibrillation: Patient initially presenting with atrial fibrillation with RVR. Patient's home regimen included diltiazem 120 mg daily in addition to sotalol 80 mg twice a day and Coumadin. Patient presented with a therapeutic INR. Cardiology was consulted and there was some thought in converting patient from calcium channel blocker therapy. Diltiazem was administered at 30 mg every 6 hours in addition to metoprolol 12.5 mg twice a day with maintenance of home sotalol at 80 mg twice a day. The metoprolol was then uptitrated to 25 mg twice a day.    Hypertension:Patient initially presenting with low blood pressures. Blood pressure management was performed as described above. Patient's home enalapril was held in the setting of low to normal blood pressures.  Type two diabetes: patient exhibited good blood glucose control on a decreased regimen of  Lantus 40 units daily, NovoLog 15 units 3 times a day before meals, and addition to a moderate sliding scale insulin with nighttime correction.  Gout: Patient was stable pain in his left ankle throughout his hospitalization. Some of this may have been, gated by superimposed vascular disease in his left lower extremity. Patient was continued on his home colchicine at a lower dose of 0.6 mg daily down from 4 times a day due to possible interaction with diltiazem.  Iron deficiency anemia: Patient was continued on his home regimen of 325 mg ferrous sulfate daily.  Neuropathic Patient was continued on his home regimen of gabapentin 600 mg twice a day.   Discharge Vitals:   BP 105/58 mmHg  Pulse 69  Temp(Src) 97.9 F (36.6 C) (Oral)  Resp 18  Ht 5' 11"  (1.803 m)  Wt 295 lb 13.7 oz (134.2 kg)  BMI 41.28 kg/m2  SpO2 97%  Discharge Labs:  Results for orders placed or performed during the hospital encounter of 08/27/2014 (from the past 24 hour(s))  Hepatitis panel, acute     Status: None  Collection Time: 09/10/14 10:10 AM  Result Value Ref Range   Hepatitis B Surface Ag NEGATIVE NEGATIVE   HCV Ab NEGATIVE NEGATIVE   Hep A IgM NON REACTIVE NON REACTIVE   Hep B C IgM NON REACTIVE NON REACTIVE  AFP tumor marker     Status: None   Collection Time: 09/10/14 10:10 AM  Result Value Ref Range   AFP-Tumor Marker 1.9 0.0 - 8.3 ng/mL  Heparin level (unfractionated)     Status: None   Collection Time: 09/10/14 10:10 AM  Result Value Ref Range   Heparin Unfractionated 0.36 0.30 - 0.70 IU/mL  pH, body fluid     Status: None   Collection Time: 09/10/14 11:46 AM  Result Value Ref Range   pH, Fluid Type      CORRECTED ON 03/23 AT 1318: PREVIOUSLY REPORTED AS PLEURAL, L   pH, Fluid 8.50   Lactate dehydrogenase, pleural or peritoneal fluid     Status: Abnormal   Collection Time: 09/10/14 11:46 AM  Result Value Ref Range   LD, Fluid 364 (H) 3 - 23 U/L   Fluid Type-FLDH FLUID   Protein, pleural or  peritoneal fluid     Status: None   Collection Time: 09/10/14 11:46 AM  Result Value Ref Range   Total protein, fluid 3.6 g/dL   Fluid Type-FTP FLUID   Body fluid cell count with differential     Status: Abnormal   Collection Time: 09/10/14 11:46 AM  Result Value Ref Range   Fluid Type-FCT FLUID    Color, Fluid RED (A) YELLOW   Appearance, Fluid TURBID (A) CLEAR   WBC, Fluid 1759 (H) 0 - 1000 cu mm   Neutrophil Count, Fluid 63 (H) 0 - 25 %   Lymphs, Fluid 13 %   Monocyte-Macrophage-Serous Fluid 14 (L) 50 - 90 %   Eos, Fluid 10 %  Glucose, pleural or peritoneal fluid     Status: None   Collection Time: 09/10/14 11:46 AM  Result Value Ref Range   Glucose, Fluid 157 mg/dL   Fluid Type-FGLU FLUID   Amylase, Pleural Fluid     Status: None   Collection Time: 09/10/14 11:46 AM  Result Value Ref Range   Amylase, Pleural Fluid 7 U/L  Glucose, capillary     Status: Abnormal   Collection Time: 09/10/14  2:01 PM  Result Value Ref Range   Glucose-Capillary 169 (H) 70 - 99 mg/dL  Glucose, capillary     Status: Abnormal   Collection Time: 09/10/14  5:34 PM  Result Value Ref Range   Glucose-Capillary 134 (H) 70 - 99 mg/dL  Glucose, capillary     Status: Abnormal   Collection Time: 09/10/14  9:21 PM  Result Value Ref Range   Glucose-Capillary 138 (H) 70 - 99 mg/dL   Comment 1 Notify RN   Protime-INR     Status: Abnormal   Collection Time: 09/11/14  4:50 AM  Result Value Ref Range   Prothrombin Time 19.7 (H) 11.6 - 15.2 seconds   INR 1.65 (H) 0.00 - 1.49  Heparin level (unfractionated)     Status: None   Collection Time: 09/11/14  4:50 AM  Result Value Ref Range   Heparin Unfractionated 0.54 0.30 - 0.70 IU/mL  Basic metabolic panel     Status: Abnormal   Collection Time: 09/11/14  4:50 AM  Result Value Ref Range   Sodium 134 (L) 135 - 145 mmol/L   Potassium 4.1 3.5 - 5.1 mmol/L   Chloride  90 (L) 96 - 112 mmol/L   CO2 36 (H) 19 - 32 mmol/L   Glucose, Bld 86 70 - 99 mg/dL   BUN 23  6 - 23 mg/dL   Creatinine, Ser 0.64 0.50 - 1.35 mg/dL   Calcium 8.3 (L) 8.4 - 10.5 mg/dL   GFR calc non Af Amer >90 >90 mL/min   GFR calc Af Amer >90 >90 mL/min   Anion gap 8 5 - 15  CBC     Status: Abnormal   Collection Time: 09/11/14  4:50 AM  Result Value Ref Range   WBC 16.9 (H) 4.0 - 10.5 K/uL   RBC 4.04 (L) 4.22 - 5.81 MIL/uL   Hemoglobin 12.2 (L) 13.0 - 17.0 g/dL   HCT 38.7 (L) 39.0 - 52.0 %   MCV 95.8 78.0 - 100.0 fL   MCH 30.2 26.0 - 34.0 pg   MCHC 31.5 30.0 - 36.0 g/dL   RDW 14.2 11.5 - 15.5 %   Platelets 302 150 - 400 K/uL  Lactate dehydrogenase     Status: Abnormal   Collection Time: 09/11/14  4:50 AM  Result Value Ref Range   LDH 386 (H) 94 - 250 U/L  Glucose, capillary     Status: None   Collection Time: 09/11/14  7:32 AM  Result Value Ref Range   Glucose-Capillary 82 70 - 99 mg/dL   Comment 1 Notify RN     Signed: Luan Moore, MD 09/11/2014, 10:03 AM

## 2014-09-19 NOTE — Progress Notes (Signed)
Patient progressively experienced periods of apnea with heart rate declining from 50s to 30s. Eventually which lead to patient time of death at 1026am. Patients death confirmed with Mayra Neer, RN. Patients family at bedside during time of death. Son arrived soon after patient took last breath. Family support and comfort provided. MD Raelene Bott made aware and present to see patient.

## 2014-09-19 NOTE — Progress Notes (Signed)
Shift report handed off to me that patient complaint of having chest pain which started around 5am. During report patient stated that chest pain still hadn't resolved. EKG revealed patient having STEMI. O2 reading was 100 on 2L. Pulse was in low 60s. Patient was alert and oriented x4. MD K was rounding on patient and also present in room. Patient started going unresponsive about 0758, heart rate dropped to 40s, sats in 70s. Patient diaphoretic and dusky pale in color. Pupils were non-reactive. Received notification at that time from CCMD that patient heart rate was declining. Placed patient on rebreather mask, attempted to get vital signs but monitor could not pick up on blood pressure. Administered Dilaudid 1mg  at 0820. Rapid response nurse Langley Gauss also present in room at this time. MD Ngo notified, and shortly comes to patient room afterwards. Patient family at bedside and support provided. Chaplain called and at bedside. Will continue to monitor patient.

## 2014-09-19 NOTE — Progress Notes (Signed)
Responded to page from unit nurse to provide support to patient and to family at bedside. Patient is actively dying. At bedside are two sisters Phillip Meyer and Phillip Meyer.  I spoke with patient nurse who said that the  patient had been complaining of chest pain and this morning patient experienced heart attack and blood clots.  Patient is nearing end of life. Patient was non responsive for a short while during my visit and then the sisters  Watched suddenly turned himself. Family is waiting on son Phillip Jeans) to arrive from [place], [place].  Patient son spoke to his father over the phone this seem to bring comfort to sisters. Son is in route to hospital and should arrive around 10 or 10:30am. Sisters talked a lot about Phillip Meyer the patient grandson who he dearly loves.  I prayed with patient and family.  Family indicated that they were going to contact the patients'  Pastor later to notify him of Phillip Meyer' condition.  I provided empathetic listening, emotional and spiritual support, anticipatory grief support and  ministry of presence. Will pass on to unit chaplain for continued support as needed.   2014-09-30 0900  Clinical Encounter Type  Visited With Patient and family together;Health care provider  Visit Type Initial;Spiritual support;Patient actively dying  Referral From Nurse  Spiritual Encounters  Spiritual Needs Prayer;Emotional;Grief support  Stress Factors  Family Stress Factors Exhausted;Family relationships;Health changes;Meyer life changes  Phillip Meyer, [name]

## 2014-09-19 NOTE — Progress Notes (Signed)
I received a call from RN earlier this morning around 1030 notifying me that the patient had passed. This was confirmed upon exam. Patient was surrounded by his family - son and two sisters.

## 2014-09-19 NOTE — Progress Notes (Signed)
  PROGRESS NOTE MEDICINE TEACHING ATTENDING   Day 76 of stay Patient name: Phillip Meyer   Medical record number: [id number] Date of birth: 01-03-1941   Met with Phillip Meyer family at about 9am after getting the report by night team that he has had a STEMI. The patient was minimally responsive at that time. His vitals were soft at the time. His heart rhythm was irregular. His lungs were examined anteriorly, and seemed clear. His left ankle had the same erythema as yesterday with the open wound.   The night team, and cardiologist Dr Burt Knack had already talked to the sisters who were present. His son was on his way from [place]. A collective decision had been made with the family to switch him to comfort care. A chaplain was present in the room. I discussed the situation with his sisters who seemed to understand the consequences.   Later in the day, at about 1030 am, I was told that the patient had passed.    I have discussed the care of this patient with my IM team residents. Please see the resident note for details.  [place], [place] Oct 08, 2014, 1:20 PM.

## 2014-09-19 NOTE — Care Management Note (Signed)
  Page 1 of 1   2014/10/14     7:37:27 AM CARE MANAGEMENT NOTE 10-14-2014  Patient:  Phillip Meyer, Phillip Meyer   Account Number:  [id number]  Date Initiated:  09/05/2014  Documentation initiated by:  Magdalen Spatz  Subjective/Objective Assessment:     Action/Plan:   Anticipated DC Date:  09/17/2014   Anticipated DC Plan:  SKILLED NURSING FACILITY  In-house referral  Clinical Social Worker         Choice offered to / List presented to:             Status of service:  Completed, signed off Medicare Important Message given?  YES (If response is "NO", the following Medicare IM given date fields will be blank) Date Medicare IM given:  09/12/2014 Medicare IM given by:  Magdalen Spatz Date Additional Medicare IM given:  2014/10/14 Additional Medicare IM given by:  Magdalen Spatz  Discharge Disposition:    Per UR Regulation:  Reviewed for med. necessity/level of care/duration of stay  If discussed at [place] of Stay Meetings, dates discussed:   09/09/2014  09/11/2014    Comments:

## 2014-09-19 NOTE — Progress Notes (Signed)
    Subjective:  Called by Internal Medicine service about Phillip Meyer with concern for acute MI. Pt is known to me from seeing him last week in the hospital. He has widely metastatic cancer, bilateral PE, and his care is now palliative.  He has developed chest pain all night. States he hasn't slept. Also c/o dyspnea. Currently pain is 7/10.   Objective:  Vital Signs in the last 24 hours: Temp:  [97.8 F (36.6 C)-98.6 F (37 C)] 97.8 F (36.6 C) (03/29 0546) Pulse Rate:  [71-92] 71 (03/29 0546) Resp:  [18-22] 18 (03/29 0546) BP: (93-120)/(52-71) 94/61 mmHg (03/29 0645) SpO2:  [95 %-98 %] 97 % (03/29 0546) Weight:  [306 lb 9.6 oz (139.073 kg)] 306 lb 9.6 oz (139.073 kg) (03/29 0546)  Intake/Output from previous day: 03/28 0701 - 03/29 0700 In: 1107 [P.O.:960; I.V.:147] Out: 1650 [Urine:1650]  Physical Exam: Pt is somnolent, but easily arousable and provides accurate history HEENT: normal Lungs: rales bilaterally CV: irregular without murmur Abd: soft, NT, obese Ext: 3+ edema bilaterally   Lab Results:  Recent Labs  09/14/14 0355 09/15/14 0516  WBC 17.2* 18.0*  HGB 10.8* 11.5*  PLT 393 364    Recent Labs  09/14/14 0355 09/15/14 0516  NA 133* 133*  K 3.8 3.8  CL 87* 88*  CO2 38* 38*  GLUCOSE 148* 211*  BUN 16 14  CREATININE 0.62 0.66   No results for input(s): TROPONINI in the last 72 hours.  Invalid input(s): CK, MB  Cardiac Studies: EKG: atrial fibrillation with acute inferolateral STEMI  Assessment/Plan:  Acute inferolateral STEMI: complex patient with widely metastatic cancer, bilateral pulmonary emboli, heart failure. Reviewed note from Dr Marin Olp yesterday. His care is now moving in palliative direction. He is hypercoagulable from his malignancy and now has had 2 major thrombotic events with massive PE and acute MI despite therapeutic anticoagulation. I spoke with the patient and his son over the telephone. They agree with conservative comfort-based  approach. I would NOT recommend cardiac cath or PCI.   Continue treatment dose lovenox. Dilaudid or morphine for pain. ASA 81 mg daily. Otherwise comfort measures.   Sherren Mocha, M.D. 2014-09-22, 7:19 AM

## 2014-09-19 NOTE — Progress Notes (Addendum)
Subjective:  Overnight team was called as patient was having complaints of chest pain. EKG revealing that he had a STEMI. Patient unresponsive this morning.  Objective: Vital signs in last 24 hours: Filed Vitals:   10-15-2014 0450 10-15-2014 0531 15-Oct-2014 0546 10/15/14 0645  BP: 106/58  93/57 94/61  Pulse: 75  71   Temp:   97.8 F (36.6 C)   TempSrc:   Oral   Resp:   18   Height:      Weight:   306 lb 9.6 oz (139.073 kg)   SpO2:  97% 97%    Weight change:   Intake/Output Summary (Last 24 hours) at 10/15/14 1006 Last data filed at 10/15/14 0057  Gross per 24 hour  Intake    867 ml  Output   1650 ml  Net   -783 ml   General: Unresponsive to stimulation and questions Cardiac: irreg irreg, no murmurs Pulm: CTAB, no rales or wheezing Abd: soft, nontender, nondistended, BS present Ext: LLE warm, continues to have diffuse erythema along with one prominent 3 cm bursted bulla with overlying dressing, 2+ bilateral lower extremity edema Neuro: alert and oriented  Psych: unresponsive  Lab Results: Basic Metabolic Panel:  Recent Labs Lab 09/14/14 0355 09/15/14 0516  NA 133* 133*  K 3.8 3.8  CL 87* 88*  CO2 38* 38*  GLUCOSE 148* 211*  BUN 16 14  CREATININE 0.62 0.66  CALCIUM 8.7 8.3*   CBC:  Recent Labs Lab 09/14/14 0355 09/15/14 0516  WBC 17.2* 18.0*  HGB 10.8* 11.5*  HCT 33.8* 36.2*  MCV 95.2 96.3  PLT 393 364   CBG:  Recent Labs Lab 09/14/14 2201 09/15/14 0725 09/15/14 1122 09/15/14 1712 09/15/14 2106 10/15/14 0726  GLUCAP 257* 185* 208* 299* 212* 237*  Coagulation:  Recent Labs Lab 09/10/14 0524 09/11/14 0450 09/12/14 0445 09/13/14 0500  LABPROT 23.1* 19.7* 17.1* 16.3*  INR 2.03* 1.65* 1.38 1.30   Urinalysis:  Recent Labs Lab 09/09/14 1657  COLORURINE YELLOW  LABSPEC 1.018  PHURINE 5.5  GLUCOSEU NEGATIVE  HGBUR NEGATIVE  BILIRUBINUR NEGATIVE  KETONESUR NEGATIVE  PROTEINUR NEGATIVE  UROBILINOGEN 0.2  NITRITE NEGATIVE    LEUKOCYTESUR SMALL*   Micro Results: Recent Results (from the past 240 hour(s))  Body fluid culture     Status: None   Collection Time: 09/10/14 11:46 AM  Result Value Ref Range Status   Specimen Description FLUID PLEURAL LEFT  Final   Special Requests NONE  Final   Gram Stain   Final    NO WBC SEEN NO ORGANISMS SEEN Performed at [facility]    Culture   Final    NO GROWTH 3 DAYS Performed at [facility]    Report Status 09/13/2014 FINAL  Final   Studies/Results: No results found. Medications: I have reviewed the patient's current medications. Scheduled Meds: . antiseptic oral rinse  7 mL Mouth Rinse BID  . colchicine  0.6 mg Oral Daily  . dextromethorphan-guaiFENesin  1 tablet Oral BID  . diltiazem  30 mg Oral 4 times per day  . docusate sodium  200 mg Oral Daily  . enoxaparin (LOVENOX) injection  130 mg Subcutaneous Q12H  . furosemide  80 mg Oral BID  . gabapentin  600 mg Oral BID  . insulin aspart  0-15 Units Subcutaneous TID WC  . insulin aspart  0-5 Units Subcutaneous QHS  . insulin aspart  10 Units Subcutaneous TID WC  . insulin glargine  30 Units Subcutaneous  QHS  . metoCLOPramide  5 mg Oral TID AC  . metoprolol tartrate  25 mg Oral BID  . multivitamin with minerals  1 tablet Oral Daily  . rOPINIRole  2 mg Oral BID  . simvastatin  10 mg Oral QHS  . sodium chloride  3 mL Intravenous Q12H  . sotalol  80 mg Oral BID   Continuous Infusions:   PRN Meds:.HYDROmorphone (DILAUDID) injection, ipratropium-albuterol, metoCLOPramide (REGLAN) injection, promethazine, sodium chloride, traMADol Assessment/Plan:   STEMI: Patient reporting chest pain with ST elevations in the inferior leads this morning. Troponin elevated at 0.46. Patient currently unresponsive. Given goals of care, we will not pursue further diagnostics or intervention. Prognosis is poor.  -Continue Lovenox -Continue supplemental oxygen -Continue with Dilaudid 1 mg every 2 hours when  necessary -Continue aspirin 81 mg daily. 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 There is corroborating evidence from an elevated CA-19-9 and CEA. Given recent STEMI, further workup for malignancy will probably no longer be part of the patient's goals of care. -Appreciate oncology consultation -Continue Lovenox    IDDM (insulin dependent diabetes mellitus): Slightly elevated blood glucoses likely in the acute setting. Will consider discontinuation of glucose checks pending further conversation with son and family. - Lantus 30 units daily - Novolog 10u TIDAC - M-SSI    Postobstructive pneumonia: Completed extended course of Abx   Essential Hypertension: Will likely discontinue vital signs in the context of comfort care. -Diltiazem 30mg  Q6 - Metoprolol 25mg  BID - Lasix 80 BID  Lower extremity DVT, thrombophlebitis:  -Lovenox as above -Oral Dilaudid for pain control    Gout of ankle - Continue colchicine at 0.6mg  daily    Paroxysmal Atrial fibrillation - Diltiazem 30mg  Q6 - Metoprolol 25mg  BID - Sotalol - Lovenox    Pulmonary embolus due to hypercoagulable state -Lovenox as above  Chronic systolic CHF: Net neg 17.0Y since admission.  Continues to have some peripheral edema which could be contributed by bilateral DVTs.   - Patient with a persistent metabolic alkalosis. Will continue with oral Lasix 80 mg twice a day. -Will relax diet to regular  Code Status: DNR Diet: Change to regular diet (patient preference) Dispo: Disposition is deferred at this time. Prognosis is poor.   The patient does have a current PCP (Zorita Pang, MD) and does not need an [facility] hospital follow-up appointment after discharge.  The patient does not have transportation limitations that hinder  transportation to clinic appointments.  .Services Needed at time of discharge: Y = Yes, Blank = No PT:   OT:   RN:   Equipment:   Other:     LOS: 13 days   Luan Moore, MD 2014/09/22, 10:06 AM

## 2014-09-19 DEATH — deceased

## 2014-10-16 ENCOUNTER — Other Ambulatory Visit: Payer: Medicare Other

## 2015-03-04 IMAGING — CT CT L SPINE W/O CM
4 of 11 series · 12 of 33 positions shown, 13 images · non-contrast
Comparison: Radiography 01/24/2013.

CLINICAL DATA: Back pain. Right leg and hip pain.

EXAM:
CT LUMBAR SPINE WITHOUT CONTRAST
TECHNIQUE: Multidetector CT imaging of the lumbar spine was performed without
intravenous contrast administration. Multiplanar CT image
reconstructions were also generated.

[Series 2: l spine bone · axial · 0.27mm/px · z∈[-185,-97]mm · 2 of 105 slices shown, 3 images]
[im 35/105  soft-tissue]
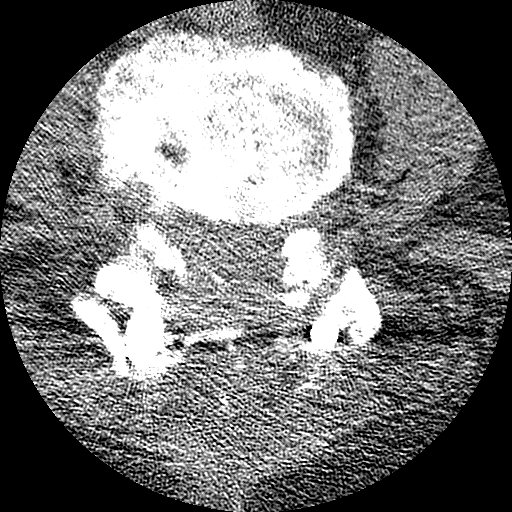
[im 35/105  bone]
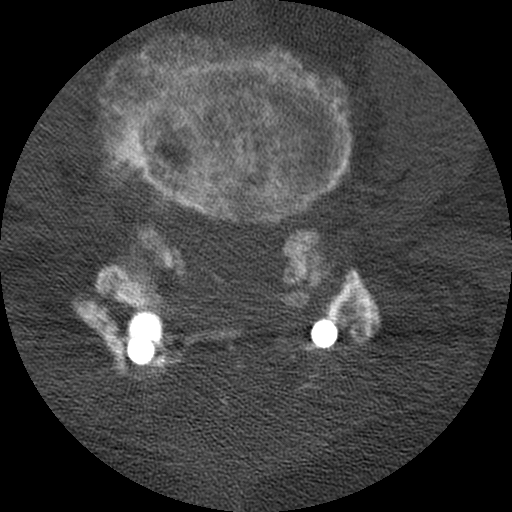
[im 70/105  bone]
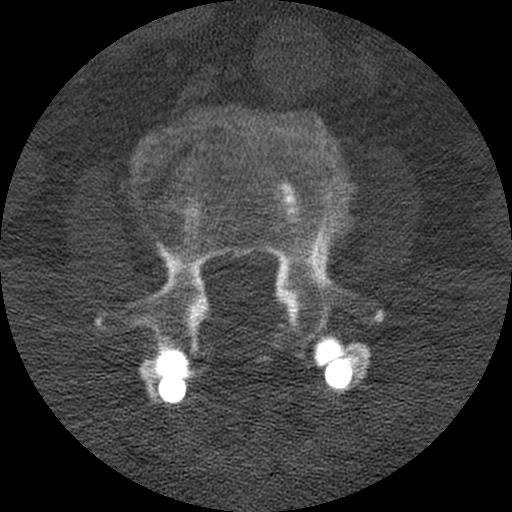

[Series 3: l spine soft · axial · 0.27mm/px · z∈[-185,-97]mm · 2 of 105 slices shown]
[im 35/105  soft-tissue]
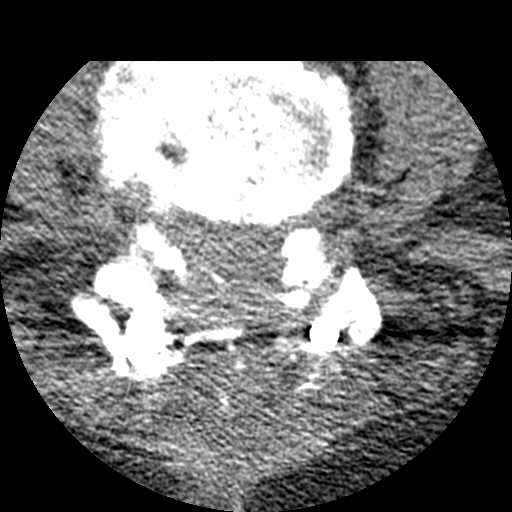
[im 70/105  soft-tissue]
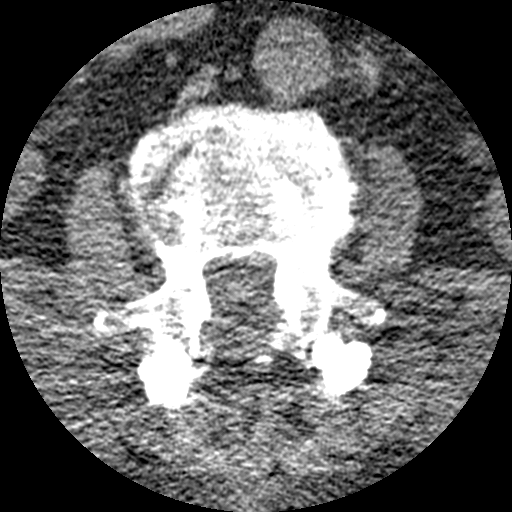

[Series 103: cor · coronal · 0.52mm/px · 5 of 60 slices shown]
[im 10/60  bone]
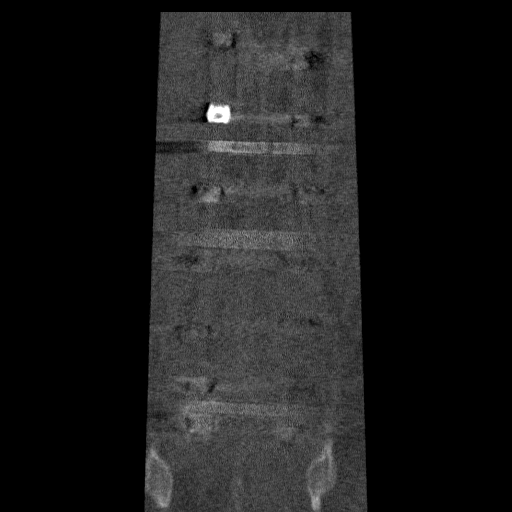
[im 20/60  bone]
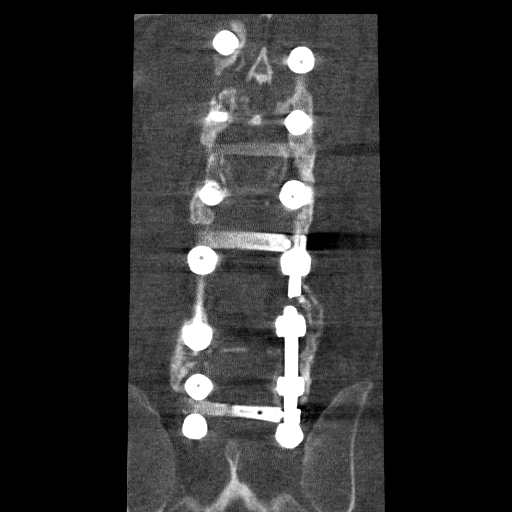
[im 30/60  bone]
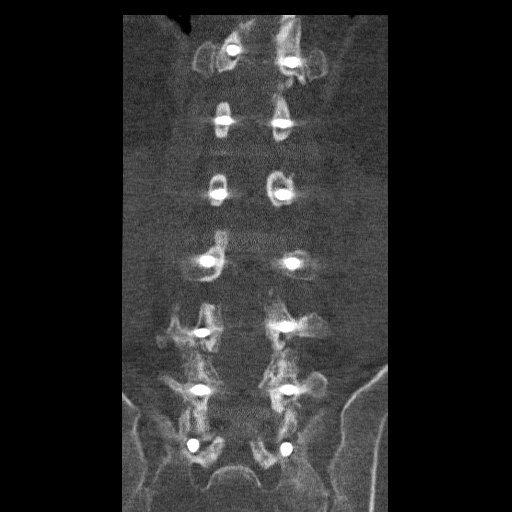
[im 40/60  bone]
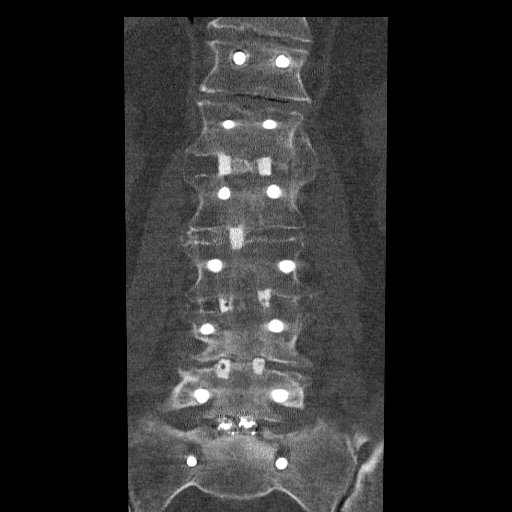
[im 50/60  bone]
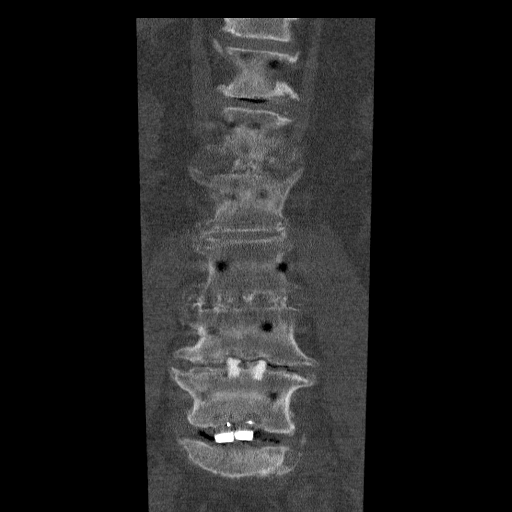

[Series 104: cor/lower · coronal · 0.23mm/px · 3 of 51 slices shown]
[im 11/51  bone]
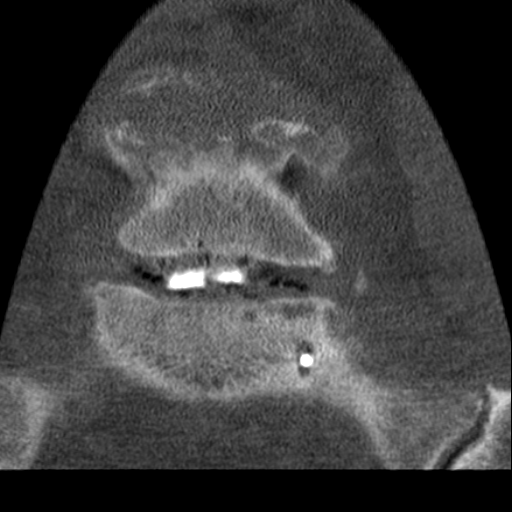
[im 21/51  bone]
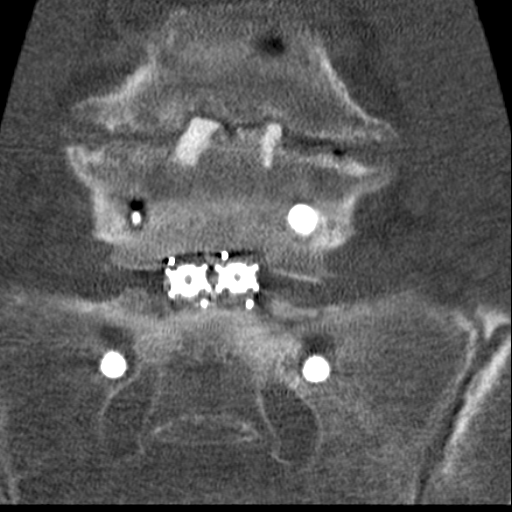
[im 31/51  bone]
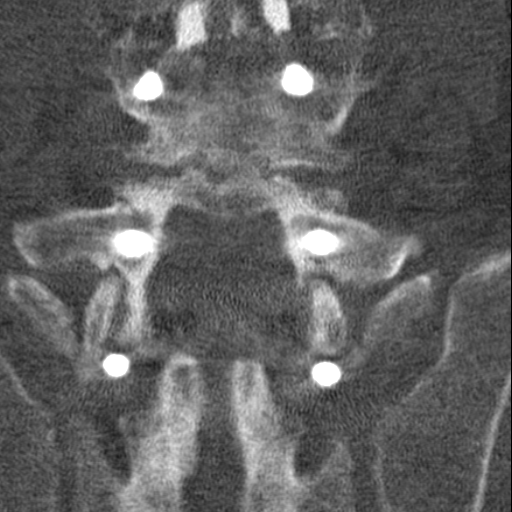

[12 of 33 positions shown; findings below may reference images not displayed]

FINDINGS: Posterior lumbar interbody fusion at L1-2, L2-3, L3-4, L4-5, and
L5-S1. There is posterior rod and pedicle screw fixation spanning
T12-S1.

As expected, there is no evidence of bony incorporation at there
recently performed L5-S1 level. The intervertebral graft remains
centrally located. At L4-5, there is incomplete incorporation of the
intervertebral graft to the L4 endplate. The other levels show
complete osseous fusion.

Defects in the posterior spinal rods on the right at the level of
L4-5 and on the left at the level of L3-4 are likely postoperative
based on recent fluoroscopy. There is extensive lucency around the
bilateral S1 screws, which suggest loosening. There is mild haloing
around the T12 screws, consistent with mild loosening.

Diffuse laminectomy, essentially from T12 to L5-S1.

No evidence of acute osseous fracture.

Concerning the right leg and hip pain, there is osseous neural
foraminal stenosis at L4-5 related to disc narrowing and
facet/endplate spurs, unchanged from recent MRI (10/31/2012).

There is gas in the L4-5 and L5-S1 intervertebral spaces which may
be from vacuum phenomenon. At the L5-S1 interspace, this could also
be postoperative. Unexpected gas within the dorsal canal/laminectomy
defect at L5, presumably still postoperative. No evidence of osseous
erosions/infection.
IMPRESSION: 1. No acute abnormality, including fracture or displacement of the
recently placed intervertebral graft (L5-S1).
2. L4-5 PLIF, with pseudoarthrosis. There is solid osseous fusion at
the other remote operative levels (L1-2, L2-3, and L3-4).
3. Haloing/loosening of the S1 and T12 fixation screws.
4. Residual gas in the perispinal/epidural space at L5-S1 -
presumably still postoperative gas.
